# Patient Record
Sex: Male | Born: 1935 | Race: White
Health system: Southern US, Community
[De-identification: ages and names within clinical notes are randomized; demographics above are authoritative.]

## PROBLEM LIST (undated history)

## (undated) DIAGNOSIS — I739 Peripheral vascular disease, unspecified: Secondary | ICD-10-CM

## (undated) DIAGNOSIS — K219 Gastro-esophageal reflux disease without esophagitis: Secondary | ICD-10-CM

## (undated) DIAGNOSIS — I82409 Acute embolism and thrombosis of unspecified deep veins of unspecified lower extremity: Secondary | ICD-10-CM

## (undated) DIAGNOSIS — E785 Hyperlipidemia, unspecified: Secondary | ICD-10-CM

## (undated) HISTORY — PX: ABDOMINAL SURGERY: SHX537

## (undated) HISTORY — DX: Acute embolism and thrombosis of unspecified deep veins of unspecified lower extremity: I82.409

## (undated) HISTORY — DX: Peripheral vascular disease, unspecified: I73.9

---

## 2000-10-09 ENCOUNTER — Ambulatory Visit: Admission: RE | Admit: 2000-10-09 | Payer: Self-pay | Source: Ambulatory Visit | Admitting: Gastroenterology

## 2002-04-06 ENCOUNTER — Ambulatory Visit
Admit: 2002-04-06 | Disposition: A | Discharge status: Home / Self Care | Payer: Self-pay | Source: Ambulatory Visit | Admitting: Internal Medicine

## 2002-09-18 ENCOUNTER — Ambulatory Visit
Admit: 2002-09-18 | Disposition: A | Discharge status: Home / Self Care | Payer: Self-pay | Source: Ambulatory Visit | Admitting: Internal Medicine

## 2003-07-08 ENCOUNTER — Ambulatory Visit
Admit: 2003-07-08 | Disposition: A | Discharge status: Home / Self Care | Payer: Self-pay | Source: Ambulatory Visit | Admitting: Internal Medicine

## 2004-06-21 ENCOUNTER — Ambulatory Visit: Admission: RE | Admit: 2004-06-21 | Payer: Self-pay | Source: Ambulatory Visit | Admitting: Gastroenterology

## 2004-12-15 ENCOUNTER — Ambulatory Visit
Admit: 2004-12-15 | Disposition: A | Discharge status: Home / Self Care | Payer: Self-pay | Source: Ambulatory Visit | Admitting: Internal Medicine

## 2006-08-23 ENCOUNTER — Ambulatory Visit
Admit: 2006-08-23 | Disposition: A | Discharge status: Home / Self Care | Payer: Self-pay | Source: Ambulatory Visit | Admitting: Internal Medicine

## 2008-06-21 ENCOUNTER — Ambulatory Visit: Admission: RE | Admit: 2008-06-21 | Payer: Self-pay | Source: Ambulatory Visit | Admitting: Gastroenterology

## 2011-06-22 ENCOUNTER — Ambulatory Visit: Payer: Self-pay

## 2011-06-22 ENCOUNTER — Ambulatory Visit
Admission: RE | Admit: 2011-06-22 | Disposition: A | Discharge status: Home / Self Care | Payer: Self-pay | Source: Ambulatory Visit | Attending: Gastroenterology | Admitting: Gastroenterology

## 2011-06-25 LAB — LAB USE ONLY - HISTORICAL SURGICAL PATHOLOGY

## 2012-06-16 NOTE — Op Note (Unsigned)
DATE OF SURGERY:                    10/09/2000            SURGEON:                            Karen Kays, MD            ASSISTANT(S):                  PREOPERATIVE DIAGNOSIS:  Heme-positive stool.            POSTOPERATIVE DIAGNOSIS:  Large descending  colon  polyp.   Two  additional      polyps also found.  Moderate sigmoid diverticulosis.            PROCEDURE PERFORMED:  Colonoscopy.            ANESTHESIA:  Demerol 50 mg and Versed 3 mg IV.            SCOPE:  Olympus video adjustable pediatric colonoscope.            DESCRIPTION OF PROCEDURE:  Once informed  consent  had  been  obtained  the      patient  was  turned  in  the  left lateral  position  and  given  adequate      sedation.  The scope was easily advanced  into  the rectum and subsequently      to the level of the cecum.  The appendiceal orifice and ileocecal valve was      identified.  The terminal ileum was intubated  a  short distance.  No other      abnormalities were found.  The endoscope was slowly withdrawn.            Along the fold that contained the ileocecal valve, an adenomatous-appearing      polyp was found.  This was snared and  removed  with  excellent hemostasis.      This was removed with the endoscope.   The  scope  was then reintroduced to      the level of the cecum and slowly withdrawn.  The ascending colon otherwise      was normal.  In the transverse colon another polyp was identified; this was      also removed with snare cautery.  The remainder of the transverse colon was      normal.  The scope was withdrawn into the descending colon.            In the distal descending colon a 3-cm  polyp  was  found  on  a long stalk.      This was grabbed in one piece and adequately removed with no bleeding.  The      polyp was then removed with a basket.   The  remainder  of  the  visualized      sigmoid colon and rectum were normal.   On  retroflexed  view in the rectum      moderate-sized hemorrhoids were seen.   The scope was  removed.  The patient      tolerated the procedure well.            IMPRESSION:  Colonoscopy remarkable  for  three  large  polyps.  These were      successfully resected.  Moderate sigmoid diverticulosis.            PLAN      1.   Await biopsy results.      2.   Repeat colonoscopy in three years.      3.   Increase fibrinous diet.                                                        _____________________________________                                            _____                                            Karen Kays, MD      [id number]      D: 04/17/200212:01 P      T: 10/10/2000  6:47 A      J: [id number]      N: [id number]      CC: Lestine Mount, MD         Karen Kays, MD

## 2012-06-16 NOTE — Op Note (Unsigned)
DATE OF SURGERY:                    10/09/2000            SURGEON:                            Karen Kays, MD            ASSISTANT(S):                  PREOPERATIVE DIAGNOSIS:  Heme-positive stool.            POSTOPERATIVE DIAGNOSIS:  Large descending  colon  polyp.   Two  additional      polyps also found.  Moderate sigmoid diverticulosis.            PROCEDURE PERFORMED:  Colonoscopy.            ANESTHESIA:  Demerol 50 mg and Versed 3 mg IV.            SCOPE:  Olympus video adjustable pediatric colonoscope.            DESCRIPTION OF PROCEDURE:  Once informed  consent  had  been  obtained  the      patient  was  turned  in  the  left lateral  position  and  given  adequate      sedation.  The scope was easily advanced  into  the rectum and subsequently      to the level of the cecum.  The appendiceal orifice and ileocecal valve was      identified.  The terminal ileum was intubated  a  short distance.  No other      abnormalities were found.  The endoscope was slowly withdrawn.            Along the fold that contained the ileocecal valve, an adenomatous-appearing      polyp was found.  This was snared and  removed  with  excellent hemostasis.      This was removed with the endoscope.   The  scope  was then reintroduced to      the level of the cecum and slowly withdrawn.  The ascending colon otherwise      was normal.  In the transverse colon another polyp was identified; this was      also removed with snare cautery.  The remainder of the transverse colon was      normal.  The scope was withdrawn into the descending colon.            In the distal descending colon a 3-cm  polyp  was  found  on  a long stalk.      This was grabbed in one piece and adequately removed with no bleeding.  The      polyp was then removed with a basket.   The  remainder  of  the  visualized      sigmoid colon and rectum were normal.   On  retroflexed  view in the rectum      moderate-sized hemorrhoids were seen.   The scope was  removed.  The patient      tolerated the procedure well.            IMPRESSION:  Colonoscopy remarkable  for  three  large  polyps.  These were      successfully resected.  Moderate sigmoid diverticulosis.                  PLAN      1.   Await biopsy results.      2.   Repeat colonoscopy in three years.      3.   Increase fibrinous diet.                                                        _____________________________________                                            _____                                            Karen Kays, MD      [id number]      D: 04/17/200212:01 P      T: 10/10/2000  6:47 A      J: [id number]      N: [id number]      CC: Lestine Mount, MD         Karen Kays, MD

## 2012-06-27 NOTE — Op Note (Signed)
MRN: [id number] DOCUMENT ID: [id number]      INTRODUCTION:      79 YEAR OLD MALE PATIENT PRESENTS FOR AN ELECTIVE OUTPATIENT COLONOSCOPY.      THE INDICATION FOR THE PROCEDURE WAS FOR A HISTORY OF COLONIC POLYPS.      CONSENT:      THE BENEFITS, RISKS, AND ALTERNATIVES TO THE PROCEDURE WERE DISCUSSED AND      INFORMED CONSENT WAS OBTAINED.      PREPARATION:      EKG, PULSE, PULSE OXIMETRY AND BLOOD PRESSURE MONITORED.      MEDICATIONS:      - MAC ANESTHESIA WAS ADMINISTERED      PROCEDURE:      RECTAL EXAM: NORMAL.      THE ENDOSCOPE WAS PASSED WITHOUT DIFFICULTY TO THE TERMINAL ILEUM      CONFIRMED BY LANDMARKS.  RETROFLEXION WAS PERFORMED IN THE RECTUM.  THE      QUALITY OF THE PREPARATION WAS VERY GOOD.  THE STUDY WAS PERFORMED WITH A      CFQ 180 AL.      ASA CLASSIFICATION:      CLASS 1:  PATIENT HAS NO ORGANIC, PHYSIOLOGIC, BIOCHEMICAL OR PSYCHIATRIC      DISTURBANCE.      FINDINGS:  TWO SMALL POLYPS WERE SEEN IN THE SIGMOID.  TWO POLYPS WERE      REMOVED BY COLD SNARE.  SINGLE SMALL POLYP WAS SEEN IN THE RECTUM.  THE      POLYP WAS REMOVED BY SNARE CAUTERY.  SMALL INTERNAL HEMORRHOIDS WERE      PRESENT.  THERE WAS NO EVIDENCE OF DIVERTICULOSIS.  THE COLONOSCOPY WAS      OTHERWISE NORMAL.      COMPLICATIONS:      THERE WERE NO COMPLICATIONS ASSOCIATED WITH THE PROCEDURE.      IMPRESSION:      1.  TWO SMALL POLYPS IN THE SIGMOID. [211.3]. TWO POLYPS WERE REMOVED BY      COLD SNARE.      2.  SINGLE SMALL POLYP IN THE RECTUM. [211.4]. THE POLYP WAS REMOVED BY      SNARE CAUTERY.      3.  SMALL INTERNAL HEMORRHOIDS [455.0].      4.  THERE WAS NO EVIDENCE OF DIVERTICULOSIS.      5.  COLONOSCOPY, OTHERWISE NORMAL.      RECOMMENDATION:      - REPEAT COLONOSCOPY IN 3 YEARS.      - AWAIT BIOPSY RESULTS.      SIGNING PHYSICIAN: Ramar Nobrega L

## 2017-04-02 ENCOUNTER — Emergency Department: Payer: Medicare Other

## 2017-04-02 ENCOUNTER — Emergency Department
Admission: EM | Admit: 2017-04-02 | Discharge: 2017-04-02 | Disposition: A | Discharge status: Home / Self Care | Payer: Medicare Other | Attending: Emergency Medicine | Admitting: Emergency Medicine

## 2017-04-02 ENCOUNTER — Observation Stay
Admission: EM | Admit: 2017-04-02 | Discharge: 2017-04-03 | Disposition: A | Discharge status: Home / Self Care | Payer: Medicare Other | Attending: Surgery | Admitting: Surgery

## 2017-04-02 DIAGNOSIS — S0240DA Maxillary fracture, left side, initial encounter for closed fracture: Secondary | ICD-10-CM | POA: Insufficient documentation

## 2017-04-02 DIAGNOSIS — K219 Gastro-esophageal reflux disease without esophagitis: Secondary | ICD-10-CM | POA: Insufficient documentation

## 2017-04-02 DIAGNOSIS — S066X0A Traumatic subarachnoid hemorrhage without loss of consciousness, initial encounter: Secondary | ICD-10-CM | POA: Diagnosis present

## 2017-04-02 DIAGNOSIS — I609 Nontraumatic subarachnoid hemorrhage, unspecified: Secondary | ICD-10-CM

## 2017-04-02 DIAGNOSIS — W109XXA Fall (on) (from) unspecified stairs and steps, initial encounter: Secondary | ICD-10-CM

## 2017-04-02 DIAGNOSIS — Y92000 Kitchen of unspecified non-institutional (private) residence as  the place of occurrence of the external cause: Secondary | ICD-10-CM

## 2017-04-02 DIAGNOSIS — E785 Hyperlipidemia, unspecified: Secondary | ICD-10-CM | POA: Insufficient documentation

## 2017-04-02 DIAGNOSIS — J341 Cyst and mucocele of nose and nasal sinus: Secondary | ICD-10-CM | POA: Insufficient documentation

## 2017-04-02 DIAGNOSIS — W010XXA Fall on same level from slipping, tripping and stumbling without subsequent striking against object, initial encounter: Secondary | ICD-10-CM

## 2017-04-02 DIAGNOSIS — S0232XA Fracture of orbital floor, left side, initial encounter for closed fracture: Principal | ICD-10-CM | POA: Insufficient documentation

## 2017-04-02 DIAGNOSIS — S0285XA Fracture of orbit, unspecified, initial encounter for closed fracture: Secondary | ICD-10-CM | POA: Diagnosis present

## 2017-04-02 DIAGNOSIS — W19XXXA Unspecified fall, initial encounter: Secondary | ICD-10-CM

## 2017-04-02 DIAGNOSIS — W01198A Fall on same level from slipping, tripping and stumbling with subsequent striking against other object, initial encounter: Secondary | ICD-10-CM | POA: Insufficient documentation

## 2017-04-02 DIAGNOSIS — S0181XA Laceration without foreign body of other part of head, initial encounter: Secondary | ICD-10-CM

## 2017-04-02 DIAGNOSIS — S0083XA Contusion of other part of head, initial encounter: Secondary | ICD-10-CM | POA: Diagnosis present

## 2017-04-02 DIAGNOSIS — I452 Bifascicular block: Secondary | ICD-10-CM | POA: Insufficient documentation

## 2017-04-02 DIAGNOSIS — S01412A Laceration without foreign body of left cheek and temporomandibular area, initial encounter: Secondary | ICD-10-CM | POA: Insufficient documentation

## 2017-04-02 DIAGNOSIS — G8911 Acute pain due to trauma: Secondary | ICD-10-CM | POA: Diagnosis present

## 2017-04-02 DIAGNOSIS — S02401A Maxillary fracture, unspecified, initial encounter for closed fracture: Secondary | ICD-10-CM

## 2017-04-02 DIAGNOSIS — S0990XA Unspecified injury of head, initial encounter: Secondary | ICD-10-CM

## 2017-04-02 DIAGNOSIS — R2689 Other abnormalities of gait and mobility: Secondary | ICD-10-CM | POA: Insufficient documentation

## 2017-04-02 DIAGNOSIS — R930 Abnormal findings on diagnostic imaging of skull and head, not elsewhere classified: Secondary | ICD-10-CM | POA: Insufficient documentation

## 2017-04-02 HISTORY — DX: Hyperlipidemia, unspecified: E78.5

## 2017-04-02 HISTORY — DX: Gastro-esophageal reflux disease without esophagitis: K21.9

## 2017-04-02 LAB — CBC AND DIFFERENTIAL
Absolute NRBC: 0 10*3/uL
Basophils Absolute Automated: 0.02 10*3/uL (ref 0.00–0.20)
Basophils Automated: 0.3 %
Eosinophils Absolute Automated: 0.07 10*3/uL (ref 0.00–0.70)
Eosinophils Automated: 0.9 %
Hematocrit: 37 % — ABNORMAL LOW (ref 42.0–52.0)
Hgb: 12.8 g/dL — ABNORMAL LOW (ref 13.0–17.0)
Immature Granulocytes Absolute: 0.04 10*3/uL
Immature Granulocytes: 0.5 %
Lymphocytes Absolute Automated: 0.86 10*3/uL (ref 0.50–4.40)
Lymphocytes Automated: 11.2 %
MCH: 31.4 pg (ref 28.0–32.0)
MCHC: 34.6 g/dL (ref 32.0–36.0)
MCV: 90.9 fL (ref 80.0–100.0)
MPV: 8.9 fL — ABNORMAL LOW (ref 9.4–12.3)
Monocytes Absolute Automated: 0.35 10*3/uL (ref 0.00–1.20)
Monocytes: 4.6 %
Neutrophils Absolute: 6.33 10*3/uL (ref 1.80–8.10)
Neutrophils: 82.5 %
Nucleated RBC: 0 /100 WBC (ref 0.0–1.0)
Platelets: 136 10*3/uL — ABNORMAL LOW (ref 140–400)
RBC: 4.07 10*6/uL — ABNORMAL LOW (ref 4.70–6.00)
RDW: 14 % (ref 12–15)
WBC: 7.67 10*3/uL (ref 3.50–10.80)

## 2017-04-02 LAB — BASIC METABOLIC PANEL
Anion Gap: 9 (ref 5.0–15.0)
BUN: 14 mg/dL (ref 9–28)
CO2: 23 mEq/L (ref 22–29)
Calcium: 8.9 mg/dL (ref 7.9–10.2)
Chloride: 108 mEq/L (ref 100–111)
Creatinine: 1.2 mg/dL (ref 0.7–1.3)
Glucose: 96 mg/dL (ref 70–100)
Potassium: 3.5 mEq/L (ref 3.5–5.1)
Sodium: 140 mEq/L (ref 136–145)

## 2017-04-02 LAB — GFR: EGFR: 57.4

## 2017-04-02 LAB — PT AND APTT
PT INR: 1.1 (ref 0.9–1.1)
PT: 13.8 s (ref 12.6–15.0)
PTT: 29 s (ref 23–37)

## 2017-04-02 MED ORDER — ACETAMINOPHEN 500 MG PO TABS
1000.0000 mg | ORAL_TABLET | Freq: Once | ORAL | Status: AC
Start: 2017-04-02 — End: 2017-04-02
  Administered 2017-04-02: 1000 mg via ORAL
  Filled 2017-04-02: qty 2

## 2017-04-02 MED ORDER — ACETAMINOPHEN 325 MG PO TABS
650.0000 mg | ORAL_TABLET | Freq: Once | ORAL | Status: AC
Start: 2017-04-02 — End: 2017-04-02
  Administered 2017-04-02: 650 mg via ORAL
  Filled 2017-04-02: qty 2

## 2017-04-02 NOTE — ED Provider Notes (Signed)
Physician/Midlevel provider first contact with patient: 04/02/17 1927          [facility] EMERGENCY DEPARTMENT H&P            CLINICAL SUMMARY          Diagnosis:    .     Final diagnoses:   Subarachnoid hemorrhage   Closed fracture of left side of maxilla, initial encounter   Closed fracture of orbit, initial encounter         MDM Notes:    MDM, Differential Diagnosis (not completely inclusive) and Plan: Transferred from outlying facility for mechnical fall with left facial fxs and small SAH.   No anticoagulation, normal neuro exam.  WIll review imaging and labs, discuss with NSG and Trauma.          Disposition:      ED Disposition     ED Disposition Condition Date/Time Comment    Observation  Tue Apr 02, 2017 11:34 PM Admitting Physician: Lacretia Nicks [facility] [[id number]]   Diagnosis: Subarachnoid hemorrhage [430.ICD-9-CM]   Estimated Length of Stay: < 2 midnights   Tentative Discharge Plan?: Home or Self Care [1]   Patient Class: Observation [104]            New Prescriptions    No medications on file                    CLINICAL INFORMATION        HPI:      Chief Complaint: Fall and Facial Injury  .    Dillon Wood is a 83 y.o. male with a h/o GERD and HLD who presents BIBA in transfer from [facility] with left orbital floor fracture and concern for right SAH on head CT s/p mechanical fall 5-6 hours pta. Pt and his wife are OOT from [place], visiting their daughter's family. He states today he was going to get a leash to walk the family dog when he tripped on a ledge and fell, hitting the left side of his face against the bar counter of their kitchen. No LOC and pt sts he was able to get up after the fall. He is not anticoagulated. He was initially evaluated at [facility], at which time he had no focal neuro deficits. CT of the face showed orbital floor fracture with no signs of entrapment, maxillary sinus fracture. CT of the head showed possible small SAH and he was transferred here for further evaluation and care.      In ED pt states he feels fine, has a slight left frontal HA however no facial pain. He states he did receive 2 tylenol prior to transfer. No visual changes.    History obtained from: patient, family, review of prior chart      Symptoms have worsened in past 24-48 hours: N/A    Location: Left face  Quality: N/A  Severity: N/A  Duration: approximately 6 hours  Timing: N/A  Context: mechanical fall  Modifying Factors: N/A  Associated signs or symptoms: Mild headache      PMD   Pcp, Notonfile, MD      ROS:      -Documented in HPI. Otherwise all other systems reviewed and negative.   -Nursing notes reviewed by me.         Physical Exam:      PE   BP: 167/80, Temp: 97.9 F (36.6 C), Temp Source: Oral, Heart Rate: 68, Resp Rate: 16, SpO2: 100 %  Pulse Oximetry Analysis -100 % on room air; Normal  Vitals reviewed   GEN: . Nontoxic, Well appearing, NAD.          Head: NC  Eccymosis under the left eye and significant edema of the left cheek with dependent ecchymosis under the left cheek. Non-bleeding superficial linear lac to the right cheek. Dentition and bite are intact.     Eyes: EOMI w/o pain, nl conjunctiva. NO d/c.    PERRLA     ENT: MMM, symmetric OP, no drooling/trismus.          Neck: FROM, supple, no masses, no tenderness.         Chest: ctab, NO resp distress. Equal chest rise.          CV: rrr, NO significant murmur appreciated.          Abd: no peritoneal signs, soft, benign, no distention, no tenderness          GU:         Back: NO CVAt, NO midline ttp.         UpperExt: NO deformity. FROM, neurovasc intact. 2+radial         LowerExt: NO edema. FROM, neurovasc intact. 2+DP         Neuro: moving all ext equally, no motor deficits, normal gait.          Skin: Warm and dry. NO rash. Cap refill <5sec       Psych: Normal affect. Normal insight.                      PAST HISTORY        Primary Care Provider: Patsy Lager, MD        PMH/PSH:    .     Past Medical History:   Diagnosis Date   . Gastroesophageal  reflux disease    . Hyperlipemia        He has no past surgical history on file.      Social/Family History:      He reports that he has never smoked. He has never used smokeless tobacco. He reports that he drinks alcohol. He reports that he does not use drugs.    No family history on file.      Listed Medications on Arrival:    .     Previous Medications    No medications on file      Allergies: He has No Known Allergies.            VISIT INFORMATION        Clinical Course in the ED:       8:11 PM  Discussed with neurosurgery, they will see pt.    9:01 PM  Neurosurgery has evaluated patient, recommend admission to the floor for q4 hr neuro checks.    9:01 PM  Trauma aware of pt, will consult.        Medications Given in the ED:    .     ED Medication Orders     Start Ordered     Status Ordering Provider    04/02/17 2226 04/02/17 2225  acetaminophen (TYLENOL) tablet 1,000 mg  Once     Route: Oral  Ordered Dose: 1,000 mg     Last MAR action:  Given Mozetta Murfin L            Procedures:      Procedures      Interpretations:  EKG as interpreted by me, Dr. Salley Scarlet: none     Cardiac monitor interpretation by me, Dr. Adaly Puder:Sinus in 31s  Radiologic study results reviewed by EDP: YES  Radiologic Studies Interpreted (viewed) by me, Dr. Salley Scarlet: n/a  Laboratory results reviewed by EDP: YES                  RESULTS        Lab Results:      Results     ** No results found for the last 24 hours. **              Radiology Results:      No orders to display               Scribe Attestation:      I was acting as a Neurosurgeon for Judi Saa, MD on [name]  Treatment Team: Scribe: Maryla Morrow    I am the first provider for this patient and I personally performed the services documented. Treatment Team: Scribe: Maryla Morrow is scribing for me on Wood,Dillon L. This note accurately reflects work and decisions made by me.  Judi Saa, MD           Noralyn Pick, MD       Judi Saa, MD  04/02/17 [phone]

## 2017-04-02 NOTE — Consults (Signed)
NEUROSURGERY CONSULTATION    Date Time: 04/02/17 8:23 PM  Patient Name: Dillon Wood  Requesting Physician: Judi Saa, MD  Consulting Physician: Dr. Joie Bimler   Covered By: Neurosurgery PA pager [phone]    Time of Exam: 2030     Reason for Consultation:   Small right parietal traumatic subarachnoid hemorrhage, s/p fall while walking    History:   Dillon Wood is a 83 y.o. right handed male who presents to the hospital on 04/02/2017 with a small right parietal traumatic subarachnoid hemorrhage sustained when he fell at home earlier today. Patient tripped and struck his face on the edge of the counter top, with no loss of consciousness, and ambulatory on the scene after the fall. Patient was initially evaluated at [facility], where HCT was performed, showing the injury as noted, prompting transfer to [facility] and Neurosurgical consult. At time of exam, patient reports mild headache, denies dizziness, weakness, numbness/tingling, neck/back pain, nausea/vomiting, vision/hearing changes, chest pain, abdominal pain, shortness of breath. No anticoagulating medication.    Past Medical History:     Past Medical History:   Diagnosis Date   . Gastroesophageal reflux disease    . Hyperlipemia        Past Surgical History:   History reviewed. No pertinent surgical history.    Family History:   No family history on file.    Social History:     Social History     Social History   . Marital status: Married     Spouse name: N/A   . Number of children: N/A   . Years of education: N/A     Social History Main Topics   . Smoking status: Never Smoker   . Smokeless tobacco: Never Used   . Alcohol use Yes   . Drug use: No   . Sexual activity: Not on file     Other Topics Concern   . Not on file     Social History Narrative   . No narrative on file       Allergies:   No Known Allergies    Medications:       Review of Systems:   A comprehensive review of systems was: Negative except noted in HPI. All other systems were reviewed and are  negative    Physical Exam:     Vitals:    04/02/17 1930   BP: 162/90   Pulse: 69   Resp: 16   Temp: 97.9 F (36.6 C)   SpO2: 100%       Intake and Output Summary (Last 24 hours) at Date Time  No intake or output data in the 24 hours ending 04/02/17 2023  General Appearance: elderly male, no obvious acute distress    Physical Exam:  Heart: RRR  Lungs: CTA  Abdomen: SNT    Awake alert oriented x 3   GCS: 15 E: 4 V: 5 M: 6  Speech is clear and fluent   PERRL 4 mm EOM: intact, face: left facial edema from fx's, Tongue: midline   Cranial nerves:   -CN II: Visual fields full to bedside confrontation   -CN III, IV, VI: Pupils equal, round, and reactive to light; extraocular movements intact; no ptosis                                 -CN V: Facial sensation intact in V1 through V3 distributions   -CN  VII: left facial edema, UTA  -CN VIII: Hearing intact to conversational speech   -CN IX, X: Palate elevates symmetrically; normal phonation   -CN XI: Symmetric full strength of sternocleidomastoid and trapezius muscles   -CN XII: Tongue protrudes midline  Strength/Motor function:   LUE  Delt: 5/5 Bi: 5/5 Tri: 5/5 Wfl: 5/5 Wex: 5/5 Grip: 5/5  RUE  Delt: 5/5 Bi: 5/5 Tri: 5/5 Wfl: 5/5 Wex: 5/5 Grip: 5/5  LLE  Psoas: 5/5 Quad: 5/5 Lfl: 5/5 Lex: 5/5 ADF: 5/5 APF: 5/5 EHL: 5/5  RLE  Psoas: 5/5 Quad: 5/5 Lfl: 5/5 Lex: 5/5 ADF: 5/5 APF: 5/5 EHL: 5/5  Sensation to light touch: negative  Sensation to sharp touch: negative  Pronator drift: absent  DTRs:   Left: Bic: 1+ Pat: 1+ Ankle: 1+  Right: Bic: 1+ Pat: 1+ Ankle: 1+  Hoffmann's: negative  Clonus: negative  Babinski: mute bilaterally     Labs Reviewed:     Lab Results   Component Value Date    WBC 7.67 04/02/2017    HGB 12.8 (L) 04/02/2017    HCT 37.0 (L) 04/02/2017    MCV 90.9 04/02/2017    PLT 136 (L) 04/02/2017     Lab Results   Component Value Date    NA 140 04/02/2017    K 3.5 04/02/2017    CL 108 04/02/2017    CO2 23 04/02/2017     Lab Results   Component Value Date    INR 1.1  04/02/2017    PT 13.8 04/02/2017       Rads:     CT Head without Contrast [[id number]] (Order [id number])   Status: Final result   Study Result     HISTORY: fall headache,     TECHNIQUE: Routine  head CT without contrast.  Dose reduction with automatic exposure control, iterative  reconstruction, and/or adjustment of the mA and/or kV according to  patient size.  COMPARISON: None.  FINDINGS:   There is minimal high density about the right frontoparietal junction  sulci finding that may represent subtle subarachnoid hemorrhage. No  evidence of mass producing hematoma.   There is no evidence of acute ischemia.  There is no mass, mass effect, hemorrhage, or extra-axial fluid  collection.   The ventricles and the basal cisterns are unremarkable.  Left facial subcutaneous swelling noted. There is normal aeration of the  mastoid air cells. The skull is normal without fracture.     These  findings discussed and acknowledged with emergency physician in charge  of patient, at  04/02/2017 4:59 PM.     IMPRESSION:      1.  Possible very subtle subarachnoid hemorrhage about the right  parietal frontoparietal region. No evidence of mass producing hematoma.                  Einar Pheasant, MD   04/02/2017 5:01 PM     CT Maxillofacial Bones [[id number]] (Order [id number])   Status: Final result   Study Result     HISTORY: Posttraumatic pain    COMPARISON: None    TECHNIQUE: Noncontrast CT imaging of the face was performed in the axial  plane. Sagittal and coronal reformatted images were provided.    The following dose reduction techniques were utilized: automated  exposure control and/or adjustment of the mA and/or kV according to  patient size, and the use of iterative reconstruction technique.      FINDINGS: There is a soft tissue hematoma about the  left face centered  in the left maxillary region. There is a fracture of the left orbital  floor and left maxillary sinus without significant fluid within the  paranasal sinuses or nasal  fossa. There is a retention cyst along the  floor the left maxillary sinus. No intraocular abnormality or lens  displacement is seen. The orbital fat planes appear maintained. The  extraocular muscles and optic nerves are symmetric. The balance of the  facial skeleton appears intact. The mastoid and middle ear cavity  regions are clear. There is rightward nasal septal deviation.    IMPRESSION:     1. Left facial soft tissue hematoma.  2. Left maxillary sinus and left orbital floor fractures.    Otho Ket, MD   04/02/2017 5:14 PM       Assessment:   83 year old male with tiny wisp of traumatic subarachnoid hemorrhage, high right convexity, s/p fall    Plan:   Admit to Trauma Service  Medical management per Trauma Team  q 4 hour neuro checks  No anticoagulation/ASA/NSAIDs  No AED unless patient has a seizure  Repeat CT for neuro decline  HOB> 30 degrees  No acute neurosurgical intervention at this time    Plan discussed with and developed by Dr. Adrienne Mocha    ICP monitor: Patient does not meet criteria for placement, as: patient is following commands, patient has a neurologic exam that can be followed and this is not a severe TBI    Signed by: Cecilio Asper PA-C  Date/Time: 04/02/17 8:23 PM

## 2017-04-02 NOTE — ED Provider Notes (Signed)
Physician/Midlevel provider first contact with patient: 04/02/17 1612         History     Chief Complaint   Patient presents with   . Fall   . Facial Injury     Dillon Wood is a 83 y.o. Male with history of reflux, here for evaluation of mechanical fall.  Patient was visiting his children.  Took a misstep while at home and fell forward hitting the left side of his face onto the corner of a counter.  Patient denies any LOC, altered mentation, nausea, vomiting, visual changes.  Does state that he has a mild headache as well.  Denies any other neck pain, shortness of breath, chest pain, hip pain, or other concerns.  Patient is ambulatory.  Appears in no acute distress.  Here for evaluation.  Patient is on no blood thinning medications               Nursing (triage) note reviewed for the following pertinent information:  Pt with trip and fall at approx 3pm onto face. Pt with L sided facial swelling. Not on blood thinners. Denies LOC.    Past Medical History:   Diagnosis Date   . Gastroesophageal reflux disease    . Hyperlipemia        No past surgical history on file.    No family history on file.    Social  Social History   Substance Use Topics   . Smoking status: Never Smoker   . Smokeless tobacco: Never Used   . Alcohol use Yes       .     No Known Allergies    Home Medications     Med List Status:  In Progress Set By: Dillon Circle, RN at 04/02/2017  3:26 PM        No Medications           Review of Systems   Constitutional: Negative for fever.   HENT: Negative for congestion.    Respiratory: Negative for cough and shortness of breath.    Cardiovascular: Negative for chest pain.   Gastrointestinal: Negative for abdominal pain, diarrhea and vomiting.   Musculoskeletal: Negative for arthralgias and back pain.   Skin: Negative for wound.   Neurological: Positive for headaches. Negative for dizziness, seizures, syncope, facial asymmetry, weakness and numbness.       Physical Exam    BP: (!) 164/100, Heart Rate: 76,  Temp: 97.8 F (36.6 C), Resp Rate: 18, SpO2: 96 %, Weight: 77.1 kg    Physical Exam   Constitutional: He is oriented to person, place, and time. He appears well-developed and well-nourished. No distress.   HENT:   Head: Normocephalic.       Right Ear: External ear normal.   Left Ear: External ear normal.   Mouth/Throat: Oropharynx is clear and moist. No oropharyngeal exudate.   Eyes: Pupils are equal, round, and reactive to light. EOM are normal.   Neck: Normal range of motion. Neck supple.   Cardiovascular: Normal rate and regular rhythm.    Pulmonary/Chest: Effort normal and breath sounds normal. No respiratory distress. He has no wheezes. He has no rales.   Neurological: He is alert and oriented to person, place, and time. No cranial nerve deficit.   Skin: Skin is warm. He is not diaphoretic.   Psychiatric: He has a normal mood and affect. His behavior is normal. Thought content normal.   Nursing note and vitals reviewed.  MDM and ED Course     ED Medication Orders     Start Ordered     Status Ordering Provider    04/02/17 1714 04/02/17 1713  acetaminophen (TYLENOL) tablet 650 mg  Once     Route: Oral  Ordered Dose: 650 mg     Last MAR action:  Given Deysy Schabel             MDM  Number of Diagnoses or Management Options  Closed fracture of left orbital floor, initial encounter:   Closed fracture of left side of maxilla, initial encounter:   Fall, initial encounter:   Injury of head, initial encounter:   SAH (subarachnoid hemorrhage):   Diagnosis management comments: Pt here for eval of mechanical fall while at his daughter's home.   Patient took a misstep and hit the left side of his face on the corner of a countertop.  Denies any LOC, altered mentation.  Patient does state that he has a headache as well.  Bedside exam shows no focal neuro deficits  Appears in no acute distress  CT face shows orbital floor fracture, no signs of entrapment, maxillary sinus fracture.  CT head shows possible small  subarachnoid hemorrhage.  Discussed this with radiologist  Discussed with attending physician  Discussed with [facility] ED.  Piedad Climes, attending Dr. Curator who looks at the patient  Discussed with patient and wife who understands                     Procedures    Clinical Impression & Disposition     Clinical Impression  Final diagnoses:   Fall, initial encounter   Closed fracture of left side of maxilla, initial encounter   Injury of head, initial encounter   SAH (subarachnoid hemorrhage)   Closed fracture of left orbital floor, initial encounter        ED Disposition     ED Disposition Condition Date/Time Comment    Transfer to Another Facility  Tue Apr 02, 2017  6:00 PM [name] should be transferred out to [facility] ED. Dr Arlyss Repress is accepting ER MD.              There are no discharge medications for this patient.                Nettie Elm, [place]  04/02/17 1921

## 2017-04-02 NOTE — H&P (Addendum)
TRAUMA HISTORY AND PHYSICAL     Date Time: 04/02/17 10:26 PM  Patient Name: Dillon Wood  Attending Physician: Lacretia Nicks, MD  Primary Care Physician: Patsy Lager, MD    Date of Admission:   04/02/2017  7:26 PM    Trauma Level:   Consult    Assessment/Plan:   The patient has the following active problems:  Patient Active Problem List   Diagnosis   . Traumatic subarachnoid hemorrhage without loss of consciousness, initial encounter   . Closed fracture of left side of maxilla   . Left orbit fracture, closed, initial encounter   . Facial contusion, initial encounter   . Acute pain due to trauma       Plan by systems:  Neuro: subtle right frontoparietal SAH.   -neurosurgery consult  -no current indication for AEDs  -q4h neuro check  -pain control prn  Pulm:  -pulmonary toilet  CV: history of HLD  Continue statin  Endo:  No active issues  GI:   NPO, IVF while monitoring neuro exam  Neuromuscular: left orbital floor fracture, maxillary fracture, extensive facial edema  -plastic surgery consult consult  Psych: no active issues, good family support. Wife and daughter present at bedside  Wounds: small laceration left maxillary region  No repair indicated    Trauma Acute Care Surgery Attending    I have seen and examined this patient with the Trauma Acute Care Surgery team.  I attest to the resident note after review.  We reviewed all pertinent labs, xrays and consultant reports and determined a plan of care. I have edited the resident note and am including additional plans below:    Admit to ward  Neuro  - serial exams  - pain management  Pulm  - pulmonary hygiene  CV  - monitor hemodynamics  - home meds: statin  FEN  - IVF hydration  - diet: NPO, start diet in AM with no changes  - monitor I/Os  - foley: no  Misc  - DVT prophylaxis: chemical:contraindicated, mechanical: SCDs  - consultation: Neurosurgery, Plastic surgery  Discussed plan of care with pt and his wife.  They are visiting their daughter from [place]  and we discussed ok for follow-up in [place].    Darci Current, MD, FACS   Acute Care Surgery  General Surgery/Trauma/Critical Care  [id number]    Patient will be admitted to: WARD  Massive transfusion protocol:  No      Consulting Services:   Plastics - Thedore Mins    Patient Complaint:   Dillon Wood is a 83 y.o. male who presents to the hospital after Fall: Fall from # of stairs: 1.  Patient was in his daughter's home (visiting from [place]) when he missed a stair and fell, hitting his head on a cabinet. He denies loss of consciousness, confirmed by the two witnesses who were there (wife and daughter). He initially went to [facility], where he was found to have possible right frontoparietal SAH, left maxillary sinus fracture, and orbital floor fracture. Negative CT C-spine. He reports left frontal headache and no other symptoms. Walked under his own power after the fall. No history of anticoagulation     Denies blurry vision or vision changes, difficulty speaking or breathing, chest pain, shortness of breath, abdominal pain, nausea, vomiting.       Allergies:   No Known Allergies    Medication:     Not sure, reports taking something for HLD    Past Medical History:  Past Medical History:   Diagnosis Date   . Gastroesophageal reflux disease    . Hyperlipemia    SCC and BCC of head, excised    Past Surgical History:   History reviewed. No pertinent surgical history.  Inguinal hernia repair    Family History:   none    Social History:     Social History     Social History   . Marital status: Married     Spouse name: N/A   . Number of children: N/A   . Years of education: N/A     Social History Main Topics   . Smoking status: Never Smoker   . Smokeless tobacco: Never Used   . Alcohol use Yes   . Drug use: No   . Sexual activity: Not on file     Other Topics Concern   . Not on file     Social History Narrative   . No narrative on file         Review of Systems:   Review of Systems   Eyes: Negative for blurred vision.    Neurological: Negative for loss of consciousness.   All other systems reviewed and are negative.      Physical Exam:   Physical Exam   Constitutional: He is oriented to person, place, and time and well-developed, well-nourished, and in no distress. He appears not jaundiced. He appears not cachectic.  Non-toxic appearance. No distress.   HENT:   Head: Normocephalic. Head is with contusion.       Right Ear: External ear normal.   Left Ear: External ear normal.   Nose: Nose normal.   Eyes: Pupils are equal, round, and reactive to light. Conjunctivae and EOM are normal. Right eye exhibits no discharge and no exudate. Left eye exhibits no discharge and no exudate.   Neck: Trachea normal and normal range of motion. Neck supple. No tracheal tenderness, no spinous process tenderness and no muscular tenderness present. No neck rigidity. No tracheal deviation, no edema, no erythema and normal range of motion present.   Cardiovascular: Normal rate, regular rhythm, normal heart sounds and intact distal pulses.    Pulses:       Radial pulses are 2+ on the right side, and 2+ on the left side.   Pulmonary/Chest: Effort normal and breath sounds normal. No stridor.   Abdominal: Soft. Normal appearance. There is no tenderness. There is no rebound and no CVA tenderness.   obese   Musculoskeletal:        Right shoulder: Normal.        Left shoulder: Normal. He exhibits normal range of motion, no tenderness, no bony tenderness, no swelling, no effusion and no deformity.        Right elbow: He exhibits normal range of motion.        Left elbow: Normal. He exhibits normal range of motion.        Right hip: He exhibits normal range of motion.        Left hip: He exhibits normal range of motion.        Right knee: He exhibits normal range of motion and no swelling. No tenderness found.        Left knee: He exhibits normal range of motion and no swelling. No tenderness found.   Neurological: He is alert and oriented to person, place, and  time. He has normal strength. He is not agitated and not disoriented. He displays normal speech. No cranial  nerve deficit or sensory deficit. GCS score is 15.   Skin: He is not diaphoretic.   Vitals reviewed.      Vitals:    04/02/17 2100   BP: 139/73   Pulse: 65   Resp:    Temp:    SpO2: 98%           Labs:     Results     ** No results found for the last 24 hours. **          Rads:   Radiological Procedure reviewed.      None      The following images were received from an outside facility and reviewed:CT Head and CT Spine

## 2017-04-02 NOTE — ED Notes (Signed)
Patient remains awake and alert, talkative with the transport team.  Swelling slightly improved to the left cheek area, however being to show some bruising.  Spouse remains in attendance.

## 2017-04-02 NOTE — Progress Notes (Signed)
Neurosurgery Progress Note    NS Examined Date Time: 04/02/17 8:23 PM  Signed by: Cecilio Asper  Time consult called: 2015  Date/Time: 04/02/17 8:23 PM

## 2017-04-02 NOTE — ED Triage Notes (Signed)
Pt with trip and fall at approx 3pm onto face. Pt with L sided facial swelling. Not on blood thinners. Denies LOC. Pt alert, gait steady in triage.

## 2017-04-03 DIAGNOSIS — S0282XA Fracture of other specified skull and facial bones, left side, initial encounter for closed fracture: Secondary | ICD-10-CM

## 2017-04-03 DIAGNOSIS — S0083XA Contusion of other part of head, initial encounter: Secondary | ICD-10-CM | POA: Diagnosis present

## 2017-04-03 DIAGNOSIS — G8911 Acute pain due to trauma: Secondary | ICD-10-CM | POA: Diagnosis present

## 2017-04-03 DIAGNOSIS — S0240DA Maxillary fracture, left side, initial encounter for closed fracture: Secondary | ICD-10-CM

## 2017-04-03 DIAGNOSIS — S0285XA Fracture of orbit, unspecified, initial encounter for closed fracture: Secondary | ICD-10-CM | POA: Diagnosis present

## 2017-04-03 DIAGNOSIS — S066X0A Traumatic subarachnoid hemorrhage without loss of consciousness, initial encounter: Secondary | ICD-10-CM

## 2017-04-03 DIAGNOSIS — I609 Nontraumatic subarachnoid hemorrhage, unspecified: Secondary | ICD-10-CM

## 2017-04-03 HISTORY — DX: Contusion of other part of head, initial encounter: S00.83XA

## 2017-04-03 HISTORY — DX: Fracture of orbit, unspecified, initial encounter for closed fracture: S02.85XA

## 2017-04-03 HISTORY — DX: Maxillary fracture, left side, initial encounter for closed fracture: S02.40DA

## 2017-04-03 MED ORDER — CLINDAMYCIN HCL 300 MG PO CAPS
300.0000 mg | ORAL_CAPSULE | Freq: Three times a day (TID) | ORAL | 0 refills | Status: AC
Start: 2017-04-03 — End: 2017-04-08

## 2017-04-03 MED ORDER — TRAMADOL HCL 50 MG PO TABS
25.0000 mg | ORAL_TABLET | Freq: Four times a day (QID) | ORAL | 0 refills | Status: DC | PRN
Start: 2017-04-03 — End: 2021-02-10

## 2017-04-03 MED ORDER — LACTATED RINGERS IV SOLN
100.0000 mL/h | INTRAVENOUS | Status: DC
Start: 2017-04-03 — End: 2017-04-03
  Administered 2017-04-03: 100 mL/h via INTRAVENOUS

## 2017-04-03 MED ORDER — TRAMADOL HCL 50 MG PO TABS
50.0000 mg | ORAL_TABLET | Freq: Four times a day (QID) | ORAL | Status: DC | PRN
Start: 2017-04-03 — End: 2017-04-03

## 2017-04-03 MED ORDER — ONDANSETRON HCL 4 MG PO TABS
4.0000 mg | ORAL_TABLET | Freq: Three times a day (TID) | ORAL | Status: DC | PRN
Start: 2017-04-03 — End: 2017-04-03

## 2017-04-03 MED ORDER — SENNOSIDES-DOCUSATE SODIUM 8.6-50 MG PO TABS
1.0000 | ORAL_TABLET | Freq: Every evening | ORAL | Status: DC
Start: 2017-04-03 — End: 2017-04-03

## 2017-04-03 MED ORDER — ACETAMINOPHEN 500 MG PO TABS
500.0000 mg | ORAL_TABLET | Freq: Four times a day (QID) | ORAL | Status: DC
Start: 2017-04-03 — End: 2017-04-03
  Administered 2017-04-03 (×2): 500 mg via ORAL
  Filled 2017-04-03 (×2): qty 1

## 2017-04-03 MED ORDER — ONDANSETRON HCL 4 MG/2ML IJ SOLN
4.0000 mg | Freq: Three times a day (TID) | INTRAMUSCULAR | Status: DC | PRN
Start: 2017-04-03 — End: 2017-04-03

## 2017-04-03 MED ORDER — ACETAMINOPHEN 500 MG PO TABS
500.0000 mg | ORAL_TABLET | Freq: Four times a day (QID) | ORAL | Status: DC
Start: 2017-04-03 — End: 2021-02-10

## 2017-04-03 MED ORDER — CLINDAMYCIN HCL 150 MG PO CAPS
300.0000 mg | ORAL_CAPSULE | Freq: Three times a day (TID) | ORAL | Status: DC
Start: 2017-04-03 — End: 2017-04-03

## 2017-04-03 MED ORDER — NALOXONE HCL 0.4 MG/ML IJ SOLN (WRAP)
0.2000 mg | INTRAMUSCULAR | Status: DC | PRN
Start: 2017-04-03 — End: 2017-04-03

## 2017-04-03 MED ORDER — CLINDAMYCIN HCL 150 MG PO CAPS
300.0000 mg | ORAL_CAPSULE | Freq: Three times a day (TID) | ORAL | Status: DC
Start: 2017-04-03 — End: 2017-04-03
  Filled 2017-04-03 (×3): qty 2

## 2017-04-03 MED ORDER — TRAMADOL HCL 50 MG PO TABS
25.0000 mg | ORAL_TABLET | Freq: Four times a day (QID) | ORAL | Status: DC | PRN
Start: 2017-04-03 — End: 2017-04-03

## 2017-04-03 NOTE — Consults (Signed)
NEUROSURGERY CONSULTATION    Date Time: 04/03/17 11:41 AM  Patient Name: Dillon Wood, Dillon Wood  Requesting Physician: Tomma Rakers, *       Reason for Consultation:   Small left orbital floor fracture    History:   Dillon Wood is a 83 y.o. right handed male who presents to the hospital on 04/02/2017 with a small orbital floor blowout fracture when he fell at home earlier today. Patient tripped and struck his face on the edge of the counter top, with no loss of consciousness, and ambulatory on the scene after the fall. Patient was initially evaluated at [facility], where a CT was performed, showing the injury as noted, prompting transfer to [facility].    Past Medical History:     Past Medical History:   Diagnosis Date   . Gastroesophageal reflux disease    . Hyperlipemia        Past Surgical History:     Past Surgical History:   Procedure Laterality Date   . ABDOMINAL SURGERY      Hernia repair       Family History:   No family history on file.    Social History:     Social History     Social History   . Marital status: Married     Spouse name: N/A   . Number of children: N/A   . Years of education: N/A     Social History Main Topics   . Smoking status: Never Smoker   . Smokeless tobacco: Never Used   . Alcohol use Yes   . Drug use: No   . Sexual activity: Not on file     Other Topics Concern   . Not on file     Social History Narrative   . No narrative on file       Allergies:     Allergies   Allergen Reactions   . Penicillins      Disorientation       Medications:       Review of Systems:   A comprehensive review of systems was: Negative except noted in HPI. All other systems were reviewed and are negative    Physical Exam:     Vitals:    04/03/17 1046   BP: 121/67   Pulse: 74   Resp: 18   Temp: 97.7 F (36.5 C)   SpO2: 98%       Intake and Output Summary (Last 24 hours) at Date Time    Intake/Output Summary (Last 24 hours) at 04/03/17 1141  Last data filed at 04/03/17 0600   Gross per 24 hour   Intake                0  ml   Output              150 ml   Net             -150 ml     General Appearance: elderly male, no obvious acute distress    Physical Exam:   Heart: RRR  Lungs: CTA  Abdomen: SNT    Awake alert oriented x 3 , appropriate.  Wife at bedside, patient is ambulating    Speech is clear and fluent   PERRL 4 mm EOM: intact, face: left facial edema from fx's, Tongue: midline   Cranial nerves:   -CN II: Visual fields full to bedside confrontation   -CN III, IV, VI: Pupils equal, round, and reactive  to light; extraocular movements intact; no ptosis          EOMi without diplopia in all fields of gaze                          -CN V: Facial sensation intact in V1 through V3 distributions   -CN VII: left facial edema with ecchymosis/hematoma  -CN VIII: Hearing intact to conversational speech   -CN IX, X: Palate elevates symmetrically; normal phonation   -CN XI: Symmetric full strength of sternocleidomastoid and trapezius muscles   -CN XII: Tongue protrudes midline    Labs Reviewed:     Lab Results   Component Value Date    WBC 7.67 04/02/2017    HGB 12.8 (L) 04/02/2017    HCT 37.0 (L) 04/02/2017    MCV 90.9 04/02/2017    PLT 136 (L) 04/02/2017     Lab Results   Component Value Date    NA 140 04/02/2017    K 3.5 04/02/2017    CL 108 04/02/2017    CO2 23 04/02/2017     Lab Results   Component Value Date    INR 1.1 04/02/2017    PT 13.8 04/02/2017       Rads:     CT Head without Contrast [[id number]] (Order [id number])   Status: Final result   Study Result     HISTORY: fall headache,     TECHNIQUE: Routine  head CT without contrast.  Dose reduction with automatic exposure control, iterative  reconstruction, and/or adjustment of the mA and/or kV according to  patient size.  COMPARISON: None.  FINDINGS:   There is minimal high density about the right frontoparietal junction  sulci finding that may represent subtle subarachnoid hemorrhage. No  evidence of mass producing hematoma.   There is no evidence of acute ischemia.  There is no mass,  mass effect, hemorrhage, or extra-axial fluid  collection.   The ventricles and the basal cisterns are unremarkable.  Left facial subcutaneous swelling noted. There is normal aeration of the  mastoid air cells. The skull is normal without fracture.     These  findings discussed and acknowledged with emergency physician in charge  of patient, at  04/02/2017 4:59 PM.     IMPRESSION:      1.  Possible very subtle subarachnoid hemorrhage about the right  parietal frontoparietal region. No evidence of mass producing hematoma.                  Einar Pheasant, MD   04/02/2017 5:01 PM     CT Maxillofacial Bones [[id number]] (Order [id number])   Status: Final result   Study Result     HISTORY: Posttraumatic pain    COMPARISON: None    TECHNIQUE: Noncontrast CT imaging of the face was performed in the axial  plane. Sagittal and coronal reformatted images were provided.    The following dose reduction techniques were utilized: automated  exposure control and/or adjustment of the mA and/or kV according to  patient size, and the use of iterative reconstruction technique.      FINDINGS: There is a soft tissue hematoma about the left face centered  in the left maxillary region. There is a fracture of the left orbital  floor and left maxillary sinus without significant fluid within the  paranasal sinuses or nasal fossa. There is a retention cyst along the  floor the left maxillary sinus. No intraocular abnormality or  lens  displacement is seen. The orbital fat planes appear maintained. The  extraocular muscles and optic nerves are symmetric. The balance of the  facial skeleton appears intact. The mastoid and middle ear cavity  regions are clear. There is rightward nasal septal deviation.    IMPRESSION:     1. Left facial soft tissue hematoma.  2. Left maxillary sinus and left orbital floor fractures.    Otho Ket, MD   04/02/2017 5:14 PM       Assessment:   83 year old male with tiny wisp of traumatic subarachnoid hemorrhage,  high right convexity, s/p fall    Plan:   Admit to Trauma Service  Left orbital minimally displaced fracture.  Offer observation and followup - does not meet surgical indications.    Patient lives in [place] and can followup there - recommend [facility].    Findings and Plans discussed with Trauma team and patient and patient's wife who is also present.        Signed by: Para March, MD  Date/Time: 04/03/17 11:41 AM

## 2017-04-03 NOTE — Discharge Summary -  Nursing (Signed)
Charge nurse Marisue Humble, brought AVS report to pt and pt's spouse & daughter. Pt became in a hurry to leave. AVS signed by pt and paper prescriptions provided to take to an outside pharmacy.     Clin tech removed IV.     Imaging discs picked up and provided to pt. Trauma team wrote letter for airline to note pt was hospitalized.     The discharge was rushed due to pt and family needing to d/c right away. I did not go though all the instructions as pt and family wanted to leave right away.     Pt wheelchaired down to [facility] lobby to go home.

## 2017-04-03 NOTE — Final Progress Note (DC Note for stay less than 48 (Signed)
TRAUMA TERTIARY SURVEY FORM AND FINAL PROGRESS NOTE       Interval History:   Dillon Wood is a 83 y.o. male who was admitted 04/02/2017  7:26 PM to [facility] by Tomma Rakers, * after Fall: Yes. Mechanical fall; tripped and hit head on countertop.  Denies taking blood thinners or ASA.    Substance usage:   social drinker  The patient denies current or previous tobacco use.  denied.    SBRT (Screening, Brief Intervention, and Referral to Treatment) performed: Yes  CATS (Community addiction team) requested: N/A    Allergies:           Allergies   Allergen Reactions   . Penicillins      Disorientation       Medical history:     Past Medical History   Past Medical History:   Diagnosis Date   . Gastroesophageal reflux disease    . Hyperlipemia           Medications:     Prior to Admission medications    Not on File       Current Inpatient Medications with Last Dose Taken            Current Facility-Administered Medications   Medication Dose Route Frequency Last Rate Last Dose   . acetaminophen (TYLENOL) tablet 500 mg  500 mg Oral 4 times per day   500 mg at 04/03/17 0627   . lactated ringers infusion  100 mL/hr Intravenous Continuous 100 mL/hr at 04/03/17 0358 100 mL/hr at 04/03/17 0358   . naloxone [facility]) injection 0.2 mg  0.2 mg Intravenous PRN       . ondansetron (ZOFRAN) tablet 4 mg  4 mg Oral Q8H PRN        Or   . ondansetron (ZOFRAN) injection 4 mg  4 mg Intravenous Q8H PRN       . traMADol (ULTRAM) tablet 50 mg  50 mg Oral Q6H PRN               Verify appropriate medications are reconciled: Yes    Review of systems since admission:   Review of Systems   Constitutional: Negative for chills and fever.   Eyes: Negative for blurred vision and double vision.   Respiratory: Negative for shortness of breath.    Cardiovascular: Negative for chest pain and palpitations.   Gastrointestinal: Negative for abdominal pain, nausea and vomiting.   Musculoskeletal:  Negative for back pain, joint pain and neck pain.   Neurological: Negative for dizziness and headaches.       Available radiology data:         Radiology Results (24 Hour)     ** No results found for the last 24 hours. **          Current laboratory data:     Labs in the last 24 hours       Results     ** No results found for the last 24 hours. **             Physical examination:   Physical Exam   Constitutional: He is oriented to person, place, and time.   HENT:   Ecchymosis and swelling to left side of face   Eyes: Pupils are equal, round, and reactive to light. EOM are normal.   Neck:   No stepoffs over cervical spine   Cardiovascular: Normal rate, regular rhythm and intact distal pulses.  Pulmonary/Chest: Effort normal and breath sounds normal. No respiratory distress. He has no wheezes. He exhibits no tenderness.   RA   Abdominal: Soft. Bowel sounds are normal. He exhibits no distension. There is no tenderness.   Genitourinary:   Genitourinary Comments: No foley   Musculoskeletal: Normal range of motion. He exhibits no edema or deformity.   BUE, BLE grossly normal, ROM/Neurovasc intact  CTL spine w/ no deformities, no stepoffs over TL spine  No TTP over bony prominences   Neurological: He is alert and oriented to person, place, and time. GCS score is 15.   Skin: Skin is warm and dry.       Vital signs:  Temp:  [97.6 F (36.4 C)-98.4 F (36.9 C)] 98.4 F (36.9 C)  Heart Rate:  [64-76] 64  Resp Rate:  [16-18] 18  BP: (131-167)/(64-100) 142/73    List of injuries by system:   HEENT: Left orbital floor fx; Left maxillary sinus fx. and Neurologic: possible R parietal SAH    Problem list:         Patient Active Problem List   Diagnosis   . Traumatic subarachnoid hemorrhage without loss of consciousness, initial encounter   . Closed fracture of left side of maxilla   . Left orbit fracture, closed, initial encounter   . Facial contusion, initial encounter   . Acute pain due to trauma        Verify problem list is appropriately updated: Yes    Consulting services:   Neurosurgery - Adrienne Mocha and Plastics - Jolyne Loa consulting services have been notified: Yes    Assessment and plan:   Neurological: CT head w/ subtle R SAH.  GCS 15.  No AEDs.  Multimodal pain control w/ tylenol and prn ultram  HEENT:  Left orbital floor fx; left maxillary sinus fx.  Will follow up with PRS outpatient.  Will start on Clindamycin.   Orthopedic: NAI  Pulmonary: On RA; continue pulmonary toileting  Cardiac: HDS, continue to monitor  Endocrine: NAI  Gastrointestinal: tolerating regular diet, continue BR  Renal: Voiding freely, continue to monitor  Hematologic/ID: Afebrile.    Candidate for Lovenox: No    Neuromuscular: NAI; able to ambulate without pt/ot assistance/evaluation  Cervical spine clearance: Yes  Psychiatric: Supportive care  Wounds: LWC PRN      Discharge Medications:     Current Discharge Medication List      START taking these medications    Details   acetaminophen (TYLENOL) 500 MG tablet Take 1 tablet (500 mg total) by mouth every 6 (six) hours.      clindamycin (CLEOCIN) 300 MG capsule Take 1 capsule (300 mg total) by mouth every 8 (eight) hours.for 5 days  Qty: 15 capsule, Refills: 0      traMADol (ULTRAM) 50 MG tablet Take 0.5 tablets (25 mg total) by mouth every 6 (six) hours as needed for Pain.  Qty: 10 tablet, Refills: 0             Disposition:   Home     Discharge Instructions:     Reason for your Hospital Admission:  [facility]  Left orbital floor fracture  Left maxillary sinus fracture      Instructions for after your discharge:  TRAUMA SERVICE DISCHARGE INSTRUCTIONS    [address], [address]  [place], [place]  [address]  Phone [phone], [place] [phone]      Discharge Instructions and Follow Up Information:  -Your primary physician team  during your stay was the Acute Care Surgery service (ACS) (also known as Trauma), whose surgeons specialize in General Surgery, Trauma Surgery, and  Critical Care. Our surgeons are Board Certified in both General Surgery and Surgical Critical Care.    You have been provided with a list of doctors (see above) who treated you during your hospitalization.  Not all patients need to follow up with the surgeon.  If instructed to do so, please call [phone] within 48 hours of your discharge To make a Clinic appointment for the following date:  The patient does not need to follow up with Trauma Acute Care Surgery at this time; however, the Trauma service is always available should there be any questions or concerns. Please call [phone] for any questions or concerns.         The address is [address].     If you have forms that need a doctor's signature:  - If you have insurance forms, FMLA forms, or worker's compensation forms that need to be filled out by Designer, industrial/product, please mail or fax the forms to our office at the following address:  Trauma Services  [address], [address]  [place], [place] [address]  Fax: [phone]    Pain Medication  The ACS service can manage your general postoperative or post-trauma pain. If your pain is from injuries or surgery that was handled by another surgery team or consultant, such as Orthopedics, Neurosurgery, or Spine surgery, we kindly request that you call that surgeon's office to address your pain. If you have follow-up with the Trauma Service, please call the Trauma Service Office for medication refills at [phone].   Please plan accordingly as refills can take up to 24-48 hours.    Taking Pain Medications  It is important to take your medications on time and never take more than the prescribed amount.  Use your pain medication only as directed. Take only the medication that your health care provider tells you to take.  Take your pain medications with some food to avoid an upset stomach.  Remember medications take time to work.  Most pain relievers taken by mouth take at least 20-30  minutes to take effect.  So take your medication at regular times as directed.  Don't wait until the pain gets bad to take it.  Try to time your medication so that you take it before the beginning of an activity, such as dressing or sitting at the table for dinner.  Taking your medication at night may help you get a good night's rest.  If the pain lessens, try taking your pain medication less often. Start by trying to eliminate a dose of pain medication at a time of day when you experience less pain.  If you are not able to completely eliminate the dose, try replacing the dose with a dose of acetaminophen (Tylenol), unless you have been directed not to use acetaminophen use by a physician. In this way, you can slowly wean your use of pain medication as your pain decreases, continuing to eliminate or replace other doses as described above, until you no longer need to take any medicine for pain.   Constipation is a common side effect of pain medications.  Eating lots of fruits and vegetables and drinking lots of water helps relieve constipation.  You should be taking a stool softener (ex. Colace) as long as you are on any narcotic pain medication.  You should be having a bowel  movement at least every 2-3 days.  If not, then proceed with over the counter laxatives (Senokot -S/Miralax), enemas, or suppositories to fix the constipation. You can obtain these at your local pharmacy if you develop constipation.     Avoid drinking alcohol while taking pain medicine.    Avoid driving or operating machinery while taking pain medication.   Please keep track of how many pills you have left so you will not completely run out before you need a refill.     Call your doctor if you notice any of these symptoms:    . Nausea, vomiting, diarrhea, lasting constipation, or stomach cramps  . Breathing problems or a fast heart rate  . Feeling very tired, sluggish, or dizzy  . Skin rash  . Pain that is not being treated by the prescribed  amount of medication          Follow up     Please follow up with a plastic surgeon in your area regarding your facial fractures - please take all of your clindamycin dosing for 5 days   Please follow up with a neurosurgeon in your area   No aspirin, NSAIDS, or blood thinners for 4 weeks         Activity: activity as tolerated, No driving for 2 weeks.  avoid activities which cause pain. Walking and climbing up stairs are ok, but avoid heavy lifting for the next 3-4 weeks. No driving while taking narcotic pain medication.    Diet: regular diet .The patient is instructed to drink plenty of fluids to maintain adequate hydration.    Wound Care: none needed . Showering is ok, but no baths or submerging incision in standing water for 2-3 weeks.     Follow Up:   Para March, MD  [address]  [place] [address]  [phone]    Schedule an appointment as soon as possible for a visit  Please call to make follow up appointment    Please follow up with your primary care provider in your area.    Pincus Badder, FNP-C  Trauma Acute Care Surgery  [facility]  [phone]; [phone]        Trauma surgeon attestation:   I saw and examined the patient at 1305h. My exam and findings duplicated as documented in the note above by. I have personally added to the text of this note to reflect my exam and medical assessment and plan.  I have personally reviewed the patient's history and 24 hour interval events, along with vitals, labs, radiology images, ventilator settings and additional findings found in detail within the note above. I concur with the above documented assessment and care plan which was developed with and reviewed by me except as documented below.     IMP/PLAN  Neuro-TBI, SAH neuro intact, GCS 15; multimodal pain regimen for acute pain    Max sinus/orbital floor fx nonop per plastics  Home with family, will f/u in [place] as outpt with PCP, NSY, plastics    Left facial edema, periorbital  and malar ecchymosis, PERRL/EOMI, nl nasal/oral mucosa   no stridor, trachea midline   Lungs CTA bilat/symmetric   CV: RRR; 2+radial/dp bilat   Abd: soft, nt/nd  Ext: no deform; MAE  Skin: no rashes/lesions/ulcers   Neuro: GCS 15; no focal deficits   Psych: Nl mood and affect    Larwance Sachs, MD, FACS  Acute Care Surgery

## 2017-04-03 NOTE — Plan of Care (Signed)
Problem: Safety  Goal: Patient will be free from injury during hospitalization  Outcome: Progressing   04/03/17 1610   Goal/Interventions addressed this shift   Patient will be free from injury during hospitalization  Assess patient's risk for falls and implement fall prevention plan of care per policy;Provide and maintain safe environment;Use appropriate transfer methods;Ensure appropriate safety devices are available at the bedside;Include patient/ family/ care giver in decisions related to safety;Hourly rounding;Assess for patients risk for elopement and implement Elopement Risk Plan per policy;Provide alternative method of communication if needed (communication boards, writing)       Problem: Side Effects from Pain Analgesia  Goal: Patient will experience minimal side effects of analgesic therapy  Outcome: Progressing   04/03/17 9604   Goal/Interventions addressed this shift   Patient will experience minimal side effects of analgesic therapy Monitor/assess patient's respiratory status (RR depth, effort, breath sounds);Assess for changes in cognitive function;Prevent/manage side effects per LIP orders (i.e. nausea, vomiting, pruritus, constipation, urinary retention, etc.);Evaluate for opioid-induced sedation with appropriate assessment tool (i.e. POSS)       Comments: Tylenol po per schedule for pain control, no nausea, abdomen soft, small bm this am, has voided, one assist to BR r/t left side of face very bruised and for safety, glasses are for reading per patient. Ivmf infusing without any difficulty, npo since arrival at 03:00 am per orders. Continue to monitor.

## 2017-04-03 NOTE — Consults (Signed)
Full consult to follow.     I have reviewed CT scan of facial injuries - left orbital floor fracture.  This will be non-operative management with f/u as outpatient in my office.      Recommend oral abx for 5-7 days.    Isabell Bonafede K Demontae Antunes,MD

## 2017-04-03 NOTE — Discharge Instr - AVS First Page (Addendum)
Reason for your Hospital Admission:  [facility]  Left orbital floor fracture  Left maxillary sinus fracture      Instructions for after your discharge:  TRAUMA SERVICE DISCHARGE INSTRUCTIONS    [address], [address]  [place], [place]  [address]  Phone [phone], [place] [phone]      Discharge Instructions and Follow Up Information:  -Your primary physician team during your stay was the Acute Care Surgery service (ACS) (also known as Trauma), whose surgeons specialize in General Surgery, Trauma Surgery, and Critical Care. Our surgeons are Board Certified in both General Surgery and Surgical Critical Care.    You have been provided with a list of doctors (see above) who treated you during your hospitalization.  Not all patients need to follow up with the surgeon.  If instructed to do so, please call [phone] within 48 hours of your discharge To make a Clinic appointment for the following date:  The patient does not need to follow up with Trauma Acute Care Surgery at this time; however, the Trauma service is always available should there be any questions or concerns. Please call [phone] for any questions or concerns.         The address is [address].     If you have forms that need a doctor's signature:  - If you have insurance forms, FMLA forms, or worker's compensation forms that need to be filled out by Designer, industrial/product, please mail or fax the forms to our office at the following address:  Trauma Services  [address], [address]  [place], [place] [address]  Fax: [phone]    Pain Medication  The ACS service can manage your general postoperative or post-trauma pain. If your pain is from injuries or surgery that was handled by another surgery team or consultant, such as Orthopedics, Neurosurgery, or Spine surgery, we kindly request that you call that surgeon's office to address your pain. If you have follow-up with the Trauma Service, please call the Trauma Service Office for  medication refills at [phone].   Please plan accordingly as refills can take up to 24-48 hours.    Taking Pain Medications  It is important to take your medications on time and never take more than the prescribed amount.  Use your pain medication only as directed. Take only the medication that your health care provider tells you to take.  Take your pain medications with some food to avoid an upset stomach.  Remember medications take time to work.  Most pain relievers taken by mouth take at least 20-30 minutes to take effect.  So take your medication at regular times as directed.  Don't wait until the pain gets bad to take it.  Try to time your medication so that you take it before the beginning of an activity, such as dressing or sitting at the table for dinner.  Taking your medication at night may help you get a good night's rest.  If the pain lessens, try taking your pain medication less often. Start by trying to eliminate a dose of pain medication at a time of day when you experience less pain.  If you are not able to completely eliminate the dose, try replacing the dose with a dose of acetaminophen (Tylenol), unless you have been directed not to use acetaminophen use by a physician. In this way, you can slowly wean your use of pain medication as your pain decreases, continuing to eliminate or replace other doses  as described above, until you no longer need to take any medicine for pain.   Constipation is a common side effect of pain medications.  Eating lots of fruits and vegetables and drinking lots of water helps relieve constipation.  You should be taking a stool softener (ex. Colace) as long as you are on any narcotic pain medication.  You should be having a bowel movement at least every 2-3 days.  If not, then proceed with over the counter laxatives (Senokot -S/Miralax), enemas, or suppositories to fix the constipation. You can obtain these at your local pharmacy if you develop constipation.      Avoid drinking alcohol while taking pain medicine.    Avoid driving or operating machinery while taking pain medication.   Please keep track of how many pills you have left so you will not completely run out before you need a refill.     Call your doctor if you notice any of these symptoms:    . Nausea, vomiting, diarrhea, lasting constipation, or stomach cramps  . Breathing problems or a fast heart rate  . Feeling very tired, sluggish, or dizzy  . Skin rash  . Pain that is not being treated by the prescribed amount of medication          Follow up     Please follow up with a plastic surgeon in your area regarding your facial fractures - please take all of your clindamycin dosing for 5 days   Please follow up with a neurosurgeon in your area   No aspirin, NSAIDS, or blood thinners for 4 weeks

## 2017-04-03 NOTE — UM Notes (Signed)
04/02/17 2334  Place (admit) on Observation Services       83 y.o. male who presents to the hospital after Fall: Fall from # of stairs: 1.  Patient was in his daughter's home (visiting from [place]) when he missed a stair and fell, hitting his head on a cabinet. He denies loss of consciousness, confirmed by the two witnesses who were there (wife and daughter). He initially went to [facility], where he was found to have possible right frontoparietal SAH, left maxillary sinus fracture, and orbital floor fracture. Negative CT C-spine. He reports left frontal headache and no other symptoms. Walked under his own power after the fall. No history of anticoagulation   Diagnosis   . Traumatic subarachnoid hemorrhage without loss of consciousness, initial encounter   . Closed fracture of left side of maxilla   . Left orbit fracture, closed, initial encounter   . Facial contusion, initial encounter   . Acute pain due to trauma       Plan by systems:  Neuro: subtle right frontoparietal SAH.   -neurosurgery consult  -no current indication for AEDs  -q4h neuro check  -pain control prn  Pulm:  -pulmonary toilet  CV: history of HLD  Continue statin  Endo:  No active issues  GI:   NPO, IVF while monitoring neuro exam  Neuromuscular: left orbital floor fracture, maxillary fracture, extensive facial edema  -plastic surgery consult consult  Psych: no active issues, good family support. Wife and daughter present at bedside  Wounds: small laceration left maxillary region  No repair indicated    Admit to ward    Neurosurgery consult:   Assessment:   83 year old male with tiny wisp of traumatic subarachnoid hemorrhage, high right convexity, s/p fall    Plan:   Admit to Trauma Service  Medical management per Trauma Team  q 4 hour neuro checks  No anticoagulation/ASA/NSAIDs  No AED unless patient has a seizure  Repeat CT for neuro decline  HOB> 30 degrees  No acute neurosurgical intervention at this time    Plan discussed with and  developed by Dr. Adrienne Mocha    ICP monitor: Patient does not meet criteria for placement, as: patient is following commands, patient has a neurologic exam that can be followed and this is not a severe TBI    Plastic surgery consult:   Full consult to follow.     I have reviewed CT scan of facial injuries - left orbital floor fracture.  This will be non-operative management with f/u as outpatient in my office.  Recommend oral abx for 5-7 days.    CT maxillofacial bones: 1. Left facial soft tissue hematoma.  2. Left maxillary sinus and left orbital floor fractures.    CT head: 1. Possible very subtle subarachnoid hemorrhage about the right  parietal frontoparietal region. No evidence of mass producing hematoma.     Vs-97.8, 76, 18, 164/100, 96% ra,   NPO, ivf @ 100, incentive spirometry q4h,     Cordelia Pen MSN, [employer], ACM  Utilization Review Case Manager  [employer]  [phone]

## 2017-04-03 NOTE — Progress Notes (Signed)
This note also relates to the following rows which could not be included:  LACE Score for RWB Report - Cannot attach notes to read only rows       04/03/17 1535   Discharge Disposition   Patient preference/choice provided? Yes   Physical Discharge Disposition Home   Mode of Transportation Car  (family )   Patient/Family/POA notified of transfer plan Yes   Patient agreeable to discharge plan/expected d/c date? Yes   Family/POA agreeable to discharge plan/expected d/c date? Yes   Bedside nurse notified of transport plan? Yes   CM Interventions   Follow up appointment scheduled? No   Multidisciplinary rounds/family meeting before d/c? Yes   Medicare Checklist   Is this a Medicare patient? Yes     Debby Bud, MSW  Social Worker II  [facility]  [phone]  on [place] call [phone]

## 2017-04-03 NOTE — PT Eval Note (Signed)
[facility]   Physical Therapy Evaluation     Patient: Dillon Wood    MRN#: [id number]   Unit: [facility] TOWER 3  Bed: F343/F343.01    Discharge Recommendations:   Discharge Recommendation: Home with supervision (as needed)       DME Recommendation: none from PT standpoint    .        Assessment:   Dillon Wood is a 83 y.o. male admitted 04/02/2017.  Pt's functional mobility is impacted by:  pain.  There are a few comorbidities or other factors that affect plan of care and require modification of task including: has stairs to manage.  Standardized tests and exams incorporated into evaluation include AMPAC mobility, balance, cognition/orientation, coordination, ROM  and Strength.  Pt demonstrates a stable clinical presentation.   Pt would continue to benefit from PT to address these deficits and increase functional independence.       Therapy Diagnosis: normal gait, safe mobility    Rehabilitation Potential: good    Treatment Activities: evaluation only, pt education  Educated the patient to role of physical therapy, plan of care, goals of therapy and safety with mobility and ADLs, home safety.    Plan:   PT Frequency: one time visit    Treatment/Interventions: d/c PT    Risks/benefits/POC discussed with patient and family       Precautions and Contraindications:   None from PT standpoint    Consult received for Dillon Wood for PT Evaluation and Treatment.  Patient's medical condition is appropriate for Physical Therapy intervention at this time.    History of Present Illness:   Dillon Wood is a 82 y.o. male admitted on 04/02/2017 after fall down stairs and struck head on cabinet.  Denies LOC. Imaging revealed small R parietal t SAH, facial fx.      Medical Diagnosis: Subarachnoid hemorrhage [I60.9]  Closed fracture of orbit, initial encounter [S02.80XA]  Closed fracture of left side of maxilla, initial encounter [S02.40DA]    Past Medical/Surgical History:  SCC and BCC of head,  excised  Inguinal hernia repair    Imaging/Tests/Labs:  CT head:  IMPRESSION:     1. Possible very subtle subarachnoid hemorrhage about the right  parietal frontoparietal region. No evidence of mass producing hematoma.     CT maxillofacial:  IMPRESSION:     1. Left facial soft tissue hematoma.  2. Left maxillary sinus and left orbital floor fractures.      Social History:   Prior Level of Function: independent  Assistive Devices: none  Baseline Activity: independent, plays tennis and walks regularly.  DME Currently at Home: none  Home Living Arrangements: with spouse in [place]. Visiting daughter here.  Type of Home: [facility]   Home Layout: multilevel (daughters home)    Subjective:   Patient is agreeable to participation in the therapy session. Nursing clears patient for therapy.     Patient Goal: to go home    Pain:   Scale: denies significant pain  Location: face  Intervention: declines need for pain meds    Objective:   Patient is seated in a bedside chair with peripheral IV in place.  L facial bruising periorbital.      Cognitive Status and Neuro Exam:  A and O x 3. Follows all commands.  Sensation: grossly intact to light touch throughout  Coordination: grossly equal speed and quality.    Musculoskeletal Examination  RUE ROM: WFL  LUE ROM: [facility]  RLE ROM: WFL  LLE ROM: WFL    RUE Strength: grossly 5/5  LUE Strength: grossly 5/5  RLE Strength: grossly 5/5  LLE Strength: grossly 5/5    Functional Mobility  Rolling: n/a  Supine to Sit: n/a  Scooting: independent  Sit to Stand: independent  Stand to Sit: independent  Transfers: independent        Ambulation  PMP - Progressive Mobility Protocol   PMP Activity: Step 7 - Walks out of Room  Distance Walked (ft) (Step 6,7): 300 Feet   Level of assistance required: independent  Pattern: normal pattern and cadence  Device Used: none  Weightbearing Status: no restrictions  Stair Management: independent  Number of Stairs: 10        Balance  Static Sitting: good  Dynamic  Sitting: good  Static Standing: good  Dynamic Standing: good    Participation and Activity Tolerance  Participation Effort: good  Endurance: good    AM-PACT Inpatient Short Forms  Inpatient AM-PACT Performed? (PT): Basic Mobility Inpatient Short Form  AM-PACT "6 Clicks" Basic Mobility Inpatient Short Form  Turning Over in Bed: None  Sitting Down On/Standing From Armchair: None  Lying on Back to Sitting on Side of Bed: None  Assist Moving to/from Bed to Chair: None  Assist to Walk in Hospital Room: None  Assist to Climb 3-5 Steps with Railing: None  PT Basic Mobility Raw Score: 24  CMS 0-100% Score: 0.00%      Based on [employer] AM-PACT - Basic Mobility Inpatient (Short Form) score, functional assessment, and clinical judgment:     Mobility, Current Status (W4132): CH    Mobility, Goal Status (G4010): CH    Mobility, D/C Status (U7253): CH      Attention MDs:   Thank you for allowing [place] to participate in the care of Dillon Wood. Regulations from the [employer] for Medicare and Medicaid Services (CMS) require your review and approval of this plan of care.     Please cosign this note indicating you are in agreement with the PT Plan of Care so we may initiate the therapy treatment plan.       Patient left with call bell within reach, all needs met, SCDs not in place as found, fall mat n/a, bed alarm n/a, chair alarm n/a and all questions answered. RN notified of session outcome and patient response.     Goals:     D/c PT    Mitchel Honour) Craige Cotta, PT, DPT  Pager [phone]      Time of Treatment  PT Received On: 04/03/17  Start Time: 1455  Stop Time: 1530  Time Calculation (min): 35 min

## 2017-04-04 LAB — ECG 12-LEAD
Atrial Rate: 67 {beats}/min
P Axis: 66 degrees
P-R Interval: 200 ms
Q-T Interval: 460 ms
QRS Duration: 140 ms
QTC Calculation (Bezet): 486 ms
R Axis: -71 degrees
T Axis: 34 degrees
Ventricular Rate: 67 {beats}/min

## 2020-05-15 ENCOUNTER — Other Ambulatory Visit: Payer: Self-pay

## 2020-05-15 ENCOUNTER — Ambulatory Visit: Admission: EM | Admit: 2020-05-15 | Discharge: 2020-05-15 | Discharge status: Against Medical Advice | Payer: Self-pay

## 2020-05-15 ENCOUNTER — Emergency Department (HOSPITAL_COMMUNITY)
Admission: EM | Admit: 2020-05-15 | Discharge: 2020-05-15 | Disposition: A | Discharge status: Home / Self Care | Payer: Medicare Other | Attending: Emergency Medicine | Admitting: Emergency Medicine

## 2020-05-15 ENCOUNTER — Encounter (HOSPITAL_COMMUNITY): Payer: Self-pay | Admitting: Emergency Medicine

## 2020-05-15 ENCOUNTER — Emergency Department (HOSPITAL_COMMUNITY): Payer: Medicare Other

## 2020-05-15 DIAGNOSIS — R42 Dizziness and giddiness: Secondary | ICD-10-CM | POA: Insufficient documentation

## 2020-05-15 LAB — CBC
HCT: 40.7 % (ref 39.0–52.0)
Hemoglobin: 13.5 g/dL (ref 13.0–17.0)
MCH: 30 pg (ref 26.0–34.0)
MCHC: 33.2 g/dL (ref 30.0–36.0)
MCV: 90.4 fL (ref 80.0–100.0)
Platelets: 154 10*3/uL (ref 150–400)
RBC: 4.5 MIL/uL (ref 4.22–5.81)
RDW: 14 % (ref 11.5–15.5)
WBC: 8.1 10*3/uL (ref 4.0–10.5)
nRBC: 0 % (ref 0.0–0.2)

## 2020-05-15 LAB — BASIC METABOLIC PANEL
Anion gap: 9 (ref 5–15)
BUN: 18 mg/dL (ref 8–23)
CO2: 25 mmol/L (ref 22–32)
Calcium: 9.3 mg/dL (ref 8.9–10.3)
Chloride: 104 mmol/L (ref 98–111)
Creatinine, Ser: 1.29 mg/dL — ABNORMAL HIGH (ref 0.61–1.24)
GFR, Estimated: 53 mL/min — ABNORMAL LOW (ref >60)
Glucose, Bld: 105 mg/dL — ABNORMAL HIGH (ref 70–99)
Potassium: 3.9 mmol/L (ref 3.5–5.1)
Sodium: 138 mmol/L (ref 135–145)

## 2020-05-15 MED ORDER — MECLIZINE HCL 12.5 MG PO TABS: 12.5000 mg | ORAL_TABLET | Freq: Three times a day (TID) | ORAL | 0 refills | Status: AC | PRN

## 2020-05-15 NOTE — Discharge Instructions (Addendum)
Continue your home medications.  Take the meclizine as needed for dizziness.  Return as needed for worsening or recurrent symptoms

## 2020-05-15 NOTE — ED Provider Notes (Signed)
[facility] EMERGENCY DEPARTMENT Provider Note   CSN: [id number] Arrival date & time: 05/15/20  1538     History Chief Complaint  Patient presents with  . Dizziness    Angel Cooley is a 86 y.o. male.  HPI   Pt had episodes of feeling dizzy and feeling unsteady.  He had several episodes this morning.  It occurs at rest and while moving.  He had to hold on to objects to not lose his balance.  He did not feel that the room was spinningl.  No heart racing.  No falling to one side.    Past medical history: DVT , vertigo   History reviewed. No pertinent surgical history.     History reviewed. No pertinent family history.  Social History   Tobacco Use  . Smoking status:  Patient does not smoke  Substance Use Topics  . Alcohol use: Not on file  . Drug use:  Denies    Home Medications Prior to Admission medications   Medication Sig Start Date End Date Taking? Authorizing Provider  meclizine (ANTIVERT) 12.5 MG tablet Take 1 tablet (12.5 mg total) by mouth 3 (three) times daily as needed for dizziness. 05/15/20   Linwood Dibbles, MD    Allergies    Penicillins and Sulfa antibiotics  Review of Systems   Review of Systems  All other systems reviewed and are negative.   Physical Exam Updated Vital Signs BP (!) 150/73   Pulse 74   Temp 98 F (36.7 C) (Oral)   Resp 18   SpO2 98%   Physical Exam Vitals and nursing note reviewed.  Constitutional:      General: He is not in acute distress.    Appearance: He is well-developed.  HENT:     Head: Normocephalic and atraumatic.     Right Ear: External ear normal.     Left Ear: External ear normal.  Eyes:     General: No scleral icterus.       Right eye: No discharge.        Left eye: No discharge.     Conjunctiva/sclera: Conjunctivae normal.  Neck:     Trachea: No tracheal deviation.  Cardiovascular:     Rate and Rhythm: Normal rate and regular rhythm.  Pulmonary:     Effort: Pulmonary effort is  normal. No respiratory distress.     Breath sounds: Normal breath sounds. No stridor. No wheezing or rales.  Abdominal:     General: Bowel sounds are normal. There is no distension.     Palpations: Abdomen is soft.     Tenderness: There is no abdominal tenderness. There is no guarding or rebound.  Musculoskeletal:        General: No tenderness.     Cervical back: Neck supple.  Skin:    General: Skin is warm and dry.     Findings: No rash.  Neurological:     Mental Status: He is alert and oriented to person, place, and time.     Cranial Nerves: No cranial nerve deficit (No facial droop, extraocular movements intact, tongue midline ).     Sensory: No sensory deficit.     Motor: No abnormal muscle tone or seizure activity.     Coordination: Coordination normal.     Comments: No pronator drift bilateral upper extrem, able to hold both legs off bed for 5 seconds, sensation intact in all extremities, no visual field cuts, no left or right sided neglect, normal finger-nose exam  bilaterally, no nystagmus noted      ED Results / Procedures / Treatments   Labs (all labs ordered are listed, but only abnormal results are displayed) Labs Reviewed  BASIC METABOLIC PANEL - Abnormal; Notable for the following components:      Result Value   Glucose, Bld 105 (*)    Creatinine, Ser 1.29 (*)    GFR, Estimated 53 (*)    All other components within normal limits  CBC  CBG MONITORING, ED    EKG EKG Interpretation  Date/Time:  Sunday May 15 2020 15:55:16 EST Ventricular Rate:  73 PR Interval:  186 QRS Duration: 124 QT Interval:  406 QTC Calculation: 447 R Axis:   -86 Text Interpretation: Normal sinus rhythm Left axis deviation Right bundle branch block Abnormal ECG No old tracing to compare Confirmed by Linwood Dibbles [phone]) on 05/15/2020 6:05:39 PM   Radiology CT Head Wo Contrast  Result Date: 05/15/2020 CLINICAL DATA:  Dizziness and unsteady gait EXAM: CT HEAD WITHOUT CONTRAST  TECHNIQUE: Contiguous axial images were obtained from the base of the skull through the vertex without intravenous contrast. COMPARISON:  None. FINDINGS: Brain: No evidence of acute territorial infarction, hemorrhage, hydrocephalus,extra-axial collection or mass lesion/mass effect. There is dilatation the ventricles and sulci consistent with age-related atrophy. Low-attenuation changes in the deep white matter consistent with small vessel ischemia. Vascular: No hyperdense vessel or unexpected calcification. Skull: The skull is intact. No fracture or focal lesion identified. Sinuses/Orbits: The visualized paranasal sinuses and mastoid air cells are clear. The orbits and globes intact. Other: None IMPRESSION: No acute intracranial abnormality. Findings consistent with age related atrophy and chronic small vessel ischemia Electronically Signed   By: Jonna Clark M.D.   On: 05/15/2020 17:46    Procedures Procedures (including critical care time)  Medications Ordered in ED Medications - No data to display  ED Course  I have reviewed the triage vital signs and the nursing notes.  Pertinent labs & imaging results that were available during my care of the patient were reviewed by me and considered in my medical decision making (see chart for details).  Clinical Course as of May 15 1830  Wynelle Link May 15, 2020  1817 Labs reviewed.  Creatinine increased but doubt related to his sx.     [JK]  1818 Head CT without acute findings   [JK]    Clinical Course User Index [JK] Linwood Dibbles, MD   MDM Rules/Calculators/A&P                          Patient presented to ED for evaluation of dizziness.  Not clearly vertigo.  Sounds like patient became weak and unsteady.  He does not have any focal neurologic deficits now to suggest stroke.  I did have the patient walk around the ED and he is not having any difficulty.  Laboratory test did not show any evidence of anemia or acute electrolyte abnormality.  I doubt severe  dehydration or sepsis.  Patient does not have any EKG abnormalities to suggest cardiac dysrhythmia.  Patient normally is on vertigo.  Family thinks it is possible he could have had an episode of his vertigo.  He is traveling from [place] and did not bring his meclizine with him.  We will give a prescription for meclizine.  Patient appears stable for discharge.  Patient and family are comfortable with this plan Final Clinical Impression(s) / ED Diagnoses Final diagnoses:  Dizziness  Rx / DC Orders ED Discharge Orders         Ordered    meclizine (ANTIVERT) 12.5 MG tablet  3 times daily PRN        05/15/20 Vashti Hey, MD 05/15/20 [phone]

## 2020-05-15 NOTE — ED Triage Notes (Signed)
Pt arrives to ED with chief complaint of dizziness and off balance episodes that son states lasted about 30 seconds and had about 10 episodes.  He is being treated for a dvt at this time with blood thinner.  He does have history of vertigo but is traveling from [place] and does not have his Antivert for this. Pt is alert and ox4 and no dizziness at this time,

## 2021-02-01 ENCOUNTER — Telehealth (INDEPENDENT_AMBULATORY_CARE_PROVIDER_SITE_OTHER): Payer: Self-pay

## 2021-02-01 NOTE — Telephone Encounter (Signed)
TELEPHONE DOCUMENTATION FOR POTENTIAL ADMISSION    REFERRAL INFORMATION     Caller: [name], daughter    Does patient have a DDM? (Dedicated Decision Maker/POA): Yes - both daughters not wife    Referral Source: [facility]    Confirm Address:     [address] [place] [address]   [place] [address]      Patient's geographic Provider? [place], [place]      What is the best number to call? (bold this # in the chart)  [phone]  daughter Jhon Mallozzi -       ENROLLMENT INFORMATION     Do you have any form of insurance? If yes, confirm and verify what type. [employer] and Medicare      When did the patient stop driving and why?  Last 4 months, having issues with health, radiation therapy for cancer, ulcers on bottom of feet, immobile, difficulty walking  No sore on foot is healed, is improved with walking but he is no longer driving - due to health set back      Is the patient on a ventilator?  No    Are guns and pets secured?  No pets, no guns      Who is current PCP? Dr. Neva Seat    Does patient agree to change Primary Care Provider to [facility] Provider? Yes      What Specialists does pt see regularly? Needs [facility] - oncology, dermatology, urology - needs vascular surgeon aortic issue in neck urgently    Please request last 2 visit notes from all non [place] Providers be faxed to our office. (Provide Fax #)  See MY CHART - please look.    Possibly patient has two charts - has extensive oncology treatment in [place] documented in Epic according to daughter.      Does pt currently take Coumadin or Warfarin? Eliquis twice daily - vitamins, aspirin      Does pt currently take prescription pain medication? 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 Yes    Will the DDM  (Dedicated Decision Maker/POA) be present for Admission visit? Yes      Can you electronically receive and print the Admission Consents and Guidelines or should they dropped in the mail? yes     E-mail address:  send to work:  [email or web address]    Can you electronically return the Admission Consents and Guidelines?  Scan back to [facility]

## 2021-02-06 ENCOUNTER — Encounter (INDEPENDENT_AMBULATORY_CARE_PROVIDER_SITE_OTHER): Payer: Self-pay

## 2021-02-10 ENCOUNTER — Ambulatory Visit: Payer: Medicare Other | Admitting: Internal Medicine

## 2021-02-10 ENCOUNTER — Encounter (INDEPENDENT_AMBULATORY_CARE_PROVIDER_SITE_OTHER): Payer: Self-pay | Admitting: Internal Medicine

## 2021-02-10 DIAGNOSIS — Z7189 Other specified counseling: Secondary | ICD-10-CM | POA: Insufficient documentation

## 2021-02-10 DIAGNOSIS — C760 Malignant neoplasm of head, face and neck: Secondary | ICD-10-CM | POA: Insufficient documentation

## 2021-02-10 DIAGNOSIS — S066X0A Traumatic subarachnoid hemorrhage without loss of consciousness, initial encounter: Secondary | ICD-10-CM

## 2021-02-10 DIAGNOSIS — R413 Other amnesia: Secondary | ICD-10-CM | POA: Insufficient documentation

## 2021-02-10 DIAGNOSIS — N210 Calculus in bladder: Secondary | ICD-10-CM | POA: Insufficient documentation

## 2021-02-10 DIAGNOSIS — G629 Polyneuropathy, unspecified: Secondary | ICD-10-CM | POA: Insufficient documentation

## 2021-02-10 DIAGNOSIS — I82412 Acute embolism and thrombosis of left femoral vein: Secondary | ICD-10-CM | POA: Insufficient documentation

## 2021-02-10 DIAGNOSIS — I6529 Occlusion and stenosis of unspecified carotid artery: Secondary | ICD-10-CM | POA: Insufficient documentation

## 2021-02-10 DIAGNOSIS — I739 Peripheral vascular disease, unspecified: Secondary | ICD-10-CM | POA: Insufficient documentation

## 2021-02-10 DIAGNOSIS — R296 Repeated falls: Secondary | ICD-10-CM | POA: Insufficient documentation

## 2021-02-10 NOTE — Progress Notes (Signed)
Date: 02/10/2021    Patient Name: Dillon Wood, Dillon Wood    Patient was seen in their home (POS 12) in lieu of an office visit for the following reason:   Requires use of an assistive device in order to ambulate due to poor balance.    LEGAL DOCUMENTS and CODE STATUS:   Advance Directive Received: No     Type of Document Received:   needs new forms as moved from [place]        Code Status: FULL    No chief complaint on file.     HPI:   History and ROS obtained from patient, wife, daughters on phone    Dillon Wood is a 87 y.o. year old male I am here to enroll into the medical house calls program.     Problem   Squamous Cell Carcinoma of Head and Neck    Skull.  Removal and radiaiton.      Frequent Falls   Carotid Artery Stenosis   Bladder Stone    Removed recently     Memory Loss    Shrot term issues occasionally     Pvd (Peripheral Vascular Disease)    Sores on L foot.  Had Stent placed L artery.  Wound healed     Angiogram 2021:  Left lower extremity: Occlusion extending from the superficial femoral artery through the tibioperoneal trunk with reconstitution of the peroneal artery through collaterals and single vessel runoff through the foot.   2.       Right lower extremity: Multifocal stenoses as described with single vessel runoff to the foot through the peroneal artery.   3.       Possible component of basilar pulmonary fibrosis, though evaluation is compromised by respiratory motion. This can be further evaluated with nonemergent outpatient high-resolution chest CT.   4.       Course layering bladder calculi.   5.       Enhancement within the prostate is concerning for prostate cancer, further evaluation with PSA and consideration of MRI is advised to better assess.     RECOMMENDATIONS: No additional recommendations.     Preliminary report dictated by: Salomon Fick Preliminary Date: 04/05/2020 9:17 AM     I personally reviewed the image(s) and the report above and concur.     Final Signed by Ephriam Knuckles Signed on  04/05/2020 11:30 AM       Neuropathy   Acute Deep Vein Thrombosis (Dvt) of Femoral Vein of Left Lower Extremity    Small, non occlusive.  10/2020     Advanced Directives, Counseling/Discussion    Discussed Advanced Directive and CODE status.       Traumatic Subarachnoid Hemorrhage Without Loss of Consciousness, Initial Encounter   Facial Contusion, Initial Encounter (Resolved)       Patient seen for establish care.  He has recently moved here from [place] to be near daughters.    Patient recently diagnosed with squamous cell ca on head.  Was attached to bone and has had radiation treatment, recently finished.  He needs DERM ONC in this area for follow up    Patient with recent bladder stone.  He had that removed..    Patient has had issues with PVD.  Has significant leg PVD and possibly underwent procedure but this is not clear from Care Everywhere.  His daughters also state that he has carotid artery stenosis but no one knows which side and there is nothing in Care Everywhere.  Past Medical History:   Diagnosis Date    Facial contusion, initial encounter 04/03/2017    Gastroesophageal reflux disease     Hyperlipemia      Past Surgical History:   Procedure Laterality Date    ABDOMINAL SURGERY      Hernia repair     Family History   Problem Relation Age of Onset    Heart disease Father     Stroke Father     Diabetes Maternal Aunt      Social History     Tobacco Use    Smoking status: Never     Passive exposure: Never    Smokeless tobacco: Never   Vaping Use    Vaping Use: Never used   Substance Use Topics    Alcohol use: Yes     Comment: 3 per week    Drug use: No     Allergies   Allergen Reactions    Penicillins      Disorientation       MEDICATIONS:     Current Outpatient Medications:     apixaban (ELIQUIS) 5 MG, Take 5 mg by mouth every 12 (twelve) hours, Disp: , Rfl:     aspirin EC 81 MG EC tablet, Take 81 mg by mouth daily, Disp: , Rfl:     tamsulosin (FLOMAX) 0.4 MG Cap, Take 0.4 mg by mouth Daily after  dinner, Disp: , Rfl:     ROS:   Review of Systems   Constitutional:  Negative for chills and fever.   Respiratory:  Negative for cough, choking and shortness of breath.    Cardiovascular:  Negative for palpitations and leg swelling.   Gastrointestinal:  Negative for constipation and diarrhea.   Musculoskeletal:  Positive for gait problem (ambulates with cane).   Skin:  Negative for wound.   Neurological:  Negative for headaches.   Psychiatric/Behavioral:  Negative for behavioral problems.      All other systems pertinent to the chief complaint reviewed and otherwise negative     PHYSICAL EXAM:   BP 108/60   Pulse 62   Ht 1.791 m (5' 10.5") Comment: pt report  Wt 72.6 kg (160 lb) Comment: pt report  SpO2 98%   BMI 22.63 kg/m    Wt Readings from Last 1 Encounters:   02/10/21 72.6 kg (160 lb)      Ht Readings from Last 1 Encounters:   02/10/21 1.791 m (5' 10.5")     Physical Exam  Constitutional:       General: He is not in acute distress.     Appearance: He is not ill-appearing, toxic-appearing or diaphoretic.      Comments: Conversant and clear   Eyes:      General: No scleral icterus.     Extraocular Movements: Extraocular movements intact.   Neck:      Vascular: No carotid bruit.   Cardiovascular:      Rate and Rhythm: Normal rate and regular rhythm.      Heart sounds: No murmur heard.  Pulmonary:      Effort: No respiratory distress.      Breath sounds: No wheezing, rhonchi or rales.   Abdominal:      General: There is no distension.      Tenderness: There is no abdominal tenderness. There is no guarding or rebound.   Musculoskeletal:      Cervical back: No rigidity or tenderness.      Right lower leg: No edema.  Left lower leg: No edema.      Comments: Cannot palpate L DP but foot warm and pink.  Scar at heel L from previous wound   Lymphadenopathy:      Cervical: No cervical adenopathy.   Neurological:      General: No focal deficit present.      Mental Status: He is oriented to person, place, and  time.      Comments: 3/3 word recall   Psychiatric:         Mood and Affect: Mood normal.         Behavior: Behavior normal.     DIAGNOSTICS:     Lab Results   Component Value Date    WBC 7.67 04/02/2017    HGB 12.8 (L) 04/02/2017    HCT 37.0 (L) 04/02/2017    PLT 136 (L) 04/02/2017    NA 140 04/02/2017    K 3.5 04/02/2017    CL 108 04/02/2017    CREAT 1.2 04/02/2017    BUN 14 04/02/2017    CO2 23 04/02/2017    INR 1.1 04/02/2017    GLU 96 04/02/2017     No results found.    ASSESSMENT and PLAN:   Squamous cell carcinoma of head and neck  A/P: Needs to have follow up    PVD (peripheral vascular disease)  A/P: Wound healed but needs follow up with Vascular surgery.  Family states he did have a procedure but they are not clear what.      Acute deep vein thrombosis (DVT) of femoral vein of left lower extremity  A/P: Did not see reason for DVT and patient on eliquis.  Will continue     Carotid artery stenosis  A/P: Family describes Carotid artery disease but do not see evidence of that in Care Everywhere.  Patient is to see Vascular surgery    Traumatic subarachnoid hemorrhage without loss of consciousness, initial encounter  A/P: 2018.  Has recovered.  Mild short term memory loss but did well on testing    Neuropathy  A/P: According to daughter. Patient denies any symptoms currently and will monitor clinically    Advanced directives, counseling/discussion  A/P: Patient tearful at discussion of CODE status and relates a TV show where a doctor told patients to have everything done.  Patient wants FULL CODE    Frequent falls  A/P: Just finished PT/OT and has been doing well.  Will continue to use Cane and walker as needed      No orders of the defined types were placed in this encounter.      Electronically Signed by Dutch Quint, MD      Date: 02/10/2021    Patient Name: Dillon Wood    Medicare AWV Note:    Patient was seen in their home (POS 12) in lieu of an office visit for the following reason:   Requires use of  an assistive device in order to ambulate due to poor balance.    LEGAL DOCUMENTS and CODE STATUS:   Advance Directive Received: No     Type of Document Received:   Needs [place] forms        Code Status: FULL    Health Risk Assessment:   1)  What is your living situation?  Independent at home    2)  Do you have help available to you for activities of daily living ? no, I do not need help    3)  Do you have  help available to you for medication administration?  yes, family provides assistance    4)  Can you handle your money without help? yes    5)  Do you have trouble paying for your medications?  no     6)  Do you worry about falling? yes    7)  Have you had any falls in the past year? yes, no injury    8)  Are you using any of the following to avoid falls?  cane and walker    9)  Mobility Rating:  ambulating with no exercise    10)  Home Safety Evaluation:   currently living in hotel    11)  DME in home:  walker and cane    12)  Are you feeling down, depressed or hopeless? no     13)  Are you finding joy in anything? yes     14)  Has your weight changed in the past 6 months? no change in weight      Additional Concerns    Patient Care Team:  Patsy Lager, MD as PCP - General    Past Medical History:   Diagnosis Date    Facial contusion, initial encounter 04/03/2017    Gastroesophageal reflux disease     Hyperlipemia      Past Surgical History:   Procedure Laterality Date    ABDOMINAL SURGERY      Hernia repair     Allergies   Allergen Reactions    Penicillins      Disorientation      Outpatient Medications Marked as Taking for the 02/10/21 encounter (Office Visit) with Dutch Quint, MD   Medication Sig Dispense Refill    apixaban (ELIQUIS) 5 MG Take 5 mg by mouth every 12 (twelve) hours      aspirin EC 81 MG EC tablet Take 81 mg by mouth daily      tamsulosin (FLOMAX) 0.4 MG Cap Take 0.4 mg by mouth Daily after dinner       Social History     Tobacco Use    Smoking status: Never     Passive exposure: Never     Smokeless tobacco: Never   Vaping Use    Vaping Use: Never used   Substance Use Topics    Alcohol use: Yes     Comment: 3 per week    Drug use: No      Family History   Problem Relation Age of Onset    Heart disease Father     Stroke Father     Diabetes Maternal Aunt          The following sections were reviewed this encounter by the provider:   Allergies           Hospitalizations:   hospitalization within past 6 months - discharge diagnosis Bladder stone.  PVD    Depression Screening:   No data recorded     Functional Assessment:   Falls Risk:  timed "Get Up and Go Evaluation" < 20 seconds  Hearing:  hearing significantly decreased  Exercise:  no exercise  ADL's:   Bathing - independent   Dressing - independent   Mobility - independent   Transfer - independent   Eating - independent}   Toileting - independent   ADL assistance not needed    Assessment:   BP 108/60   Pulse 62   Ht 1.791 m (5' 10.5") Comment: pt report  Wt 72.6 kg (160 lb)  Comment: pt report  SpO2 98%   BMI 22.63 kg/m      Cognitive Function:   Mood/affect: Appropriate  Appearance: neatly groomed, appropriately and adequately nourished  Family member/caregiver input:  needs referrals to specialists        AWV Mini-Cog Result:  > 3 points - negative screen for dementia      Assessment/Plan:   1. Squamous cell carcinoma of head and neck    2. Frequent falls    3. Stenosis of carotid artery, unspecified laterality    4. Bladder stone    5. Memory loss    6. PVD (peripheral vascular disease)    7. Neuropathy    8. Acute deep vein thrombosis (DVT) of femoral vein of left lower extremity    9. Traumatic subarachnoid hemorrhage without loss of consciousness, initial encounter    10. Advanced directives, counseling/discussion        Dutch Quint, MD  02/10/2021

## 2021-02-10 NOTE — Assessment & Plan Note (Signed)
A/P: Patient tearful at discussion of CODE status and relates a TV show where a doctor told patients to have everything done.  Patient wants FULL CODE

## 2021-02-10 NOTE — Assessment & Plan Note (Addendum)
A/P: Did not see reason for DVT and patient on eliquis.  Will continue

## 2021-02-10 NOTE — Assessment & Plan Note (Signed)
A/P: Family describes Carotid artery disease but do not see evidence of that in Care Everywhere.  Patient is to see Vascular surgery

## 2021-02-10 NOTE — Assessment & Plan Note (Addendum)
A/P: Needs to have follow up with DERM ONC.  Will order and request  consult

## 2021-02-10 NOTE — Patient Instructions (Addendum)
Preventive Wellness Plan  Today's Date: February 10, 2021    Patient Name:Dillon Wood    Date of Birth: 12-09-1928     As part of your wellness benefit, Medicare makes many screening tests available to you at no charge.  A complete list of these tests can be found at their website, [employer]es. However, many of these tests or recommendations are out of date, or may not apply to you. After careful consideration of your own personal health needs, the following testing is recommended for you:      Preventive Service    Up-to-date (UTD)/Due/Not Applicable (N/A)   Last Done   Medicare Frequency   Body Mass Index   Up-to-date February 10, 2021  (BMI):Body mass index is 22.63 kg/m.   Height:Height: 179.1 cm (5' 10.5") (pt report)  Weight:Weight: 72.6 kg (160 lb) (pt report) Annually   Blood Pressure: Up-to-date February 10, 2021    BP: 108/60   Every 2 yrs, if BP </= 120/80 mm hg  Annually, if BP >120-139/80-89 mm hg   Cholesterol Testing Not applicable No results found for: LDL  Regularly beginning at age 89 with risk factors   Diabetes Screening Not applicable Lab Results   Component Value Date    GLU 96 04/02/2017      If prediabetes, one screening every 6 months  Otherwise, one screening every 12 months with certain risk factors for diabetes   Osteoporosis Screening   (Bone Density Measurement)  Not applicable  Routinely, for women aged 65+   Depression Screening Up-to-date February 10, 2021  As necessary for those with risk factors   Alcohol Misuse Screening Not applicable  As necessary for those with risk factors   Immunizations:   Nursing to check registry   There is no immunization history on file for this patient. Prevnar 13: 1 dose after age 46  Pneumovax 34: 1 dose 1 year after Prevnar  Influenza: Annually   Advance Directive Needs new copy Advanced Directive form  Once; update as needed   Medical Nutrition Therapy Not applicable  As necessary for  diabetes or renal disease   Smoking Cessation Counseling Not applicable Counseling given: Not Answered   Frequency: two cessation attempts per year.           Your major risk factors:       falls     Recommendations for improvement:    Continue walking as exercise now that PT/OT done     Referrals:    See After Visit Summary orders

## 2021-02-10 NOTE — Assessment & Plan Note (Signed)
A/P: Needs urology referal

## 2021-02-10 NOTE — Assessment & Plan Note (Addendum)
A/P: Wound healed but needs follow up with Vascular surgery.  Family states he did have a procedure but they are not clear what.

## 2021-02-10 NOTE — Assessment & Plan Note (Signed)
A/P: 2018.  Has recovered.  Mild short term memory loss but did well on testing

## 2021-02-10 NOTE — Assessment & Plan Note (Signed)
A/P: Just finished PT/OT and has been doing well.  Will continue to use Cane and walker as needed

## 2021-02-10 NOTE — Assessment & Plan Note (Signed)
A/P: According to daughter. Patient denies any symptoms currently and will monitor clinically

## 2021-02-13 ENCOUNTER — Telehealth (INDEPENDENT_AMBULATORY_CARE_PROVIDER_SITE_OTHER): Payer: Self-pay

## 2021-02-13 NOTE — Telephone Encounter (Signed)
Called pt's Daughter Dillon Wood and left VM for her to call back to see if she is going to attend appts and if she needs the numbers and can schedule or needs assistance.      Dillon Wood called back and wanted the list of Dr appts needed to be emailed to her.  Emailed the list to [email or web address].  Also Emailed blank Advance Directive to her.

## 2021-02-13 NOTE — Telephone Encounter (Signed)
-----   Message from Dutch Quint, MD sent at 02/10/2021  4:16 PM EDT -----  Patient needs appointment with [facility] [phone]), Vascular surgery ([phone]) and [facility] [phone]). Please request appointments.  Please send Advanced Directive blank to daughter

## 2021-02-16 ENCOUNTER — Encounter (INDEPENDENT_AMBULATORY_CARE_PROVIDER_SITE_OTHER): Payer: Self-pay

## 2021-02-16 NOTE — Progress Notes (Signed)
*  THIS FORM IS FOR INTERNAL USE FOR VASCULAR SURGERY AND IS NOT A PROGRESS NOTE*          Appt Date/Time: 02/21/21 8:20 am    87 y.o. male   PCP: Hilma Favors, PA  Referring Physician:   Merleen Milliner      Appointment Type: New Patient  Imaging: Outside Imaging pt bring records    Chief Complaint/HPI: Consult PVD  Date of Last Visit:  N/A    Allergies:   Allergies   Allergen Reactions    Penicillins      Disorientation         Selected Medications:  Aspirin and Eliquis    Focused Prior Medical History: HLD and DVT             Smoker           Former Smoker      x    Never Smoker      Vital Signs this Visit Pulses  HT  BP R BP L Temp   Rad Ulnar Brach Fem Pop DP PT        R          WT  Pulse R Pulse L SpO2       L          Imaging results:        Plan of Care:

## 2021-02-17 ENCOUNTER — Telehealth (INDEPENDENT_AMBULATORY_CARE_PROVIDER_SITE_OTHER): Payer: Self-pay | Admitting: Internal Medicine

## 2021-02-17 ENCOUNTER — Encounter (INDEPENDENT_AMBULATORY_CARE_PROVIDER_SITE_OTHER): Payer: Self-pay | Admitting: Physician Assistant

## 2021-02-17 DIAGNOSIS — Z111 Encounter for screening for respiratory tuberculosis: Secondary | ICD-10-CM

## 2021-02-17 NOTE — Telephone Encounter (Signed)
Spoke to daughter [name] who stated has mailed pt's med list to our RN [name] Whyte.  Documentation received,this LPN scanned documentation into pt's chart but could not understand clearly which meds patient is taking currently,please review and advise how to proceed.

## 2021-02-17 NOTE — Telephone Encounter (Signed)
Patient needs appointment with [facility] [phone]),  and [facility] [phone]). Please request appointments from specialists and ask specialists to contact daughter [name] to schedule.      Also, Please ask [name] to send Medication list

## 2021-02-20 NOTE — Telephone Encounter (Signed)
Med list reviewed and updated 

## 2021-02-21 ENCOUNTER — Ambulatory Visit (HOSPITAL_BASED_OUTPATIENT_CLINIC_OR_DEPARTMENT_OTHER): Payer: Medicare Other | Admitting: Specialist

## 2021-02-21 NOTE — Telephone Encounter (Signed)
Called [facility] at [phone] spoke to Pagie,stated pt does not need a referral from [facility] faxed over information found in Care Everywhere,Dermatology Clinic office notes nad Pathology report done in 09/29/2020 and office notes from [facility] pt had an appt on 12/21/2020.    LPN called [facility]   [address]  [facility]  [place], [place] [address]  Ph: [phone]  Fax: [phone]  Receptionist stated patient needs a referral with Dx and reason of visit ,LPN informed receptionist that pt's records can be found in Epic,she stated it is easier if referral placed with the reason of the visit, informed that number will be provided to pt's daughter to coordinate an appt.    please advise/place an order.    LPN called [facility]   Ph: [phone]  Fax :[phone]   Spoke to Maci,informed patient needs a consultation with a Urologist per PCP recommendations ,daughter will call their office to coordinate an appt , Neta Mends voiced understanding and requested any supportive documentation ,pt's AVS from PCP visit on 8/19 ,Pre-Op office visit notes from [facility] on 12/07/2020,and office visit from 09/26/2020 with the Urology Clinic sent.    LPN called pt's daughter [name] ,relayed all of the specialist  information above,[name] appreciated the info and has no questions at the moment.

## 2021-02-22 ENCOUNTER — Encounter (INDEPENDENT_AMBULATORY_CARE_PROVIDER_SITE_OTHER): Payer: Self-pay | Admitting: Specialist

## 2021-02-22 ENCOUNTER — Telehealth: Payer: Self-pay

## 2021-02-22 ENCOUNTER — Encounter (INDEPENDENT_AMBULATORY_CARE_PROVIDER_SITE_OTHER): Payer: Self-pay | Admitting: Internal Medicine

## 2021-02-22 NOTE — Telephone Encounter (Signed)
Move In [employer] to patient's daughter Dillon Wood. Awaiting response concerning xray timing.

## 2021-02-23 NOTE — Telephone Encounter (Signed)
[name] emailed this RN stating to hold off on xrays until move in date is set.

## 2021-03-06 NOTE — Telephone Encounter (Signed)
Dtr [name] called re move in process items -    requests orders for CXR (tb screening for admission) and PCR test which she will get via drive through unless calls back for in home mobile phleb.  To obtain test.

## 2021-03-06 NOTE — Addendum Note (Signed)
Addended by: Terrance Mass on: 03/06/2021 05:00 PM     Modules accepted: Orders

## 2021-03-07 ENCOUNTER — Other Ambulatory Visit: Payer: Self-pay | Admitting: Physician Assistant

## 2021-03-07 NOTE — Telephone Encounter (Signed)
Called pt's daughter [name] to inquire if agrees with mobile or outpatient radiology,[name] stated will take patient to outpatient [facility] ,daughter asked this LPN to e-mail order to her e-mail,informed daughter that this LPN will e-mail order to her and will fax order to [facility] .[name] appreciated the call and has no questions at the moment.

## 2021-03-09 ENCOUNTER — Other Ambulatory Visit (INDEPENDENT_AMBULATORY_CARE_PROVIDER_SITE_OTHER): Payer: Self-pay | Admitting: Physician Assistant

## 2021-03-09 NOTE — Telephone Encounter (Signed)
Received fax from [facility] in [place], [address] requesting refill on aspirin 81 mg tab.  LPN called daughter to see what pharmacy the pt uses.  Daughter to call [facility] when she is out of an appt.

## 2021-03-14 ENCOUNTER — Ambulatory Visit: Payer: Medicare Other | Admitting: Hematology & Oncology

## 2021-03-16 ENCOUNTER — Encounter (INDEPENDENT_AMBULATORY_CARE_PROVIDER_SITE_OTHER): Payer: Self-pay

## 2021-03-16 NOTE — Progress Notes (Signed)
Appt Date/Time: 03/21/21 @  3:40 pm      87 y.o. male   PCP: Hilma Favors, PA  Referring Physician:         Appointment Type: New Patient  Imaging:  Care everywhere ( [facility] 10/23/20, L venous dop 10/23/20, L foot Xray 10/23/20)    Chief Complaint/HPI: Consult left foot wound w/throbbing pain. PAD     Date of Last Visit: n/a   Allergies:   Allergies   Allergen Reactions    Penicillins      Disorientation         Selected Medications:  Aspirin and Eliquis    Focused Prior Medical History:  n/a             Smoker           Former Smoker    x      Never Smoker      Vital Signs this Visit Pulses  HT  BP R BP L Temp   Rad Ulnar Brach Fem Pop DP PT        R          WT  Pulse R Pulse L SpO2       L          Imaging results:        Plan of Care:

## 2021-03-21 ENCOUNTER — Ambulatory Visit (INDEPENDENT_AMBULATORY_CARE_PROVIDER_SITE_OTHER): Payer: Medicare Other | Admitting: Specialist

## 2021-03-21 ENCOUNTER — Encounter (INDEPENDENT_AMBULATORY_CARE_PROVIDER_SITE_OTHER): Payer: Self-pay | Admitting: Specialist

## 2021-03-21 VITALS — BP 102/65 | HR 76 | Temp 98.8°F

## 2021-03-21 DIAGNOSIS — I6523 Occlusion and stenosis of bilateral carotid arteries: Secondary | ICD-10-CM

## 2021-03-21 DIAGNOSIS — L97529 Non-pressure chronic ulcer of other part of left foot with unspecified severity: Secondary | ICD-10-CM

## 2021-03-21 DIAGNOSIS — I739 Peripheral vascular disease, unspecified: Secondary | ICD-10-CM

## 2021-03-21 MED ORDER — ATORVASTATIN CALCIUM 40 MG PO TABS
40.0000 mg | ORAL_TABLET | Freq: Every day | ORAL | 1 refills | Status: DC
Start: 2021-03-21 — End: 2021-04-21

## 2021-03-21 NOTE — Progress Notes (Signed)
OFFICE VISIT  Vascular and Endovascular Surgery      Date: March 21, 2021  Consulting Physician: Romelle Starcher, MD   Consulting Service: Vascular and Endovascular Surgery    Chief Complaint   Patient presents with    Consult (Initial)      Consult left foot wound w/throbbing pain. PAD        History of Present Illness     Dillon Wood is a 87 y.o. male who presents as a new patient for vascular surgery consultation. Patient has a history of PAD and is s/p L SFA stent placement on 10/31/2020 by Dr. Deno Lunger at [facility] [place]. Procedure performed for left foot rest pain and non-healing wound of the left foot plantar surface.     ABI performed in May 2022 prior to intervention demonstrated R 0.79(0.41) and L 0.47 (0.21).  Venous duplex performed on 10/23/2020 demonstrated small focal mural filling defect in the distal left femoral vein is compatible with small nonocclusive DVT, possibly chronic.     Today, patient presents to the office with his son via telephone, grandson, wife.  Patient reports having a left plantar surface wound x1 year which was cared for by the wound center in [facility].  Patient reports that he received a cement cast and was told the wound was healing properly.  He has since relocated to the [place] area and has not established care with a local wound care center.  The wound is open but does not appear infected.  Patient denies any ischemic claudication or rest pain.     While under the care of the vascular surgeon in [place], patient was also told he had carotid artery stenosis and was in need of a stent to the right carotid artery.  No imaging available for review of the patient's carotid artery.  Patient denies any current symptoms of lateralizing neurologic ischemia or any history of TIA, CVA or amaurosis fugax.    Medically managed on aspirin and Eliquis (for DVT).  Patient was on statin in the past but is not currently on any statin therapy.    Past medical  history includes pulmonary fibrosis, skin cancer s/p raditation therapy to the head and neck, and nephrolithiasis status post lithotripsy.     Past Medical History     Past Medical History:   Diagnosis Date    Closed fracture of left side of maxilla 04/03/2017    Facial contusion, initial encounter 04/03/2017    Gastroesophageal reflux disease     Hyperlipemia     Left orbit fracture, closed, initial encounter 04/03/2017       Past Surgical History:   Procedure Laterality Date    ABDOMINAL SURGERY      Hernia repair       Allergies     Allergies   Allergen Reactions    Penicillins      Disorientation       Medications     Current Outpatient Medications:     apixaban (ELIQUIS) 5 MG, Take 5 mg by mouth every 12 (twelve) hours, Disp: , Rfl:     aspirin EC 81 MG EC tablet, Take 81 mg by mouth daily, Disp: , Rfl:     tamsulosin (FLOMAX) 0.4 MG Cap, Take 0.4 mg by mouth Daily after dinner, Disp: , Rfl:     Family History   Problem Relation Age of Onset    Heart disease Father     Stroke Father  Diabetes Maternal Aunt        Social History     Socioeconomic History    Marital status: Married    Number of children: 4   Occupational History    Occupation: Surveyor, [employer] of [employer]   Tobacco Use    Smoking status: Never     Passive exposure: Never    Smokeless tobacco: Never   Vaping Use    Vaping Use: Never used   Substance and Sexual Activity    Alcohol use: Yes     Comment: 3 per week    Drug use: No       Review of Systems   Constitutional: Denies weight changes, fever, chills, weakness or night sweats.   Neurologic:  Denies headaches, dizziness, fainting, numbness, tingling, seizure, stroke or tremors.    HEENT:  Denies visual changes, hearing loss, difficulty swallowing, snoring or hoarseness.  Respiratory: Denies cough, wheezing, SOB or hemoptysis.  Cardiovascular: Denies chest pain, cardiac murmur, irregular heartbeat, dyspnea, lower extremity swelling, lower extremity claudication.    Gastrointestinal:Denies  abdominal pain, nausea, vomiting and diarrhea  Genitourinary: Denies for dysuria or hematuria.   Hematologic/Lymphatic:  Denies lymphadenopathy, bleeding disorders, easy bruising or anemia.   Endocrine:  Denies diabetes, thyroid disease, polydipsia, polyuria, heat or cold intolerance.    Musculoskeletal:  Denies joint pain, back pain, muscle pain or muscle weakness.   Skin: left foot plantar surface wound.    Psychiatric:  Denies depression, anxiety or panic attacks.    All other systems were reviewed and are negative except what is stated in the HPI       Physical Exam     Vitals:    03/21/21 1608   BP: 102/65   BP Site: Right arm   Patient Position: Sitting   Cuff Size: Medium   Pulse: 76   Temp: 98.8 F (37.1 C)   TempSrc: Temporal   SpO2: 95%       There is no height or weight on file to calculate BMI.    General:  Patient appears their stated age, well-nourished.  Alert and in no apparent distress.  HEENT:  Normocephalic.   Neck:  No JVD  Lungs: Respiratory effort unlabored, chest expansion symmetric.  Cardiac: RRR,  no rubs, gallops or murmurs.    Abd: Soft, nondistended, nontender.   Extremities: FROM, symmetric. No ulcers, gangrene or non-healing wound.    Pulses: Right Femoral pulses 2+, Left Femoral 2+, small ulcer over the head of left first metatarsal which according the patient is healing  Skin: Color appropriate for race, Skin warm, dry, no gangrene, no non healing ulcers, no varicose veins , no hyperpigmentation, no lipo-dermatosclerosis  Neuro: Good insight and judgment, oriented to person, place, and time CN II-XII intact, gross motor and sensory intact     Labs     CBC:   WBC   Date/Time Value Ref Range Status   04/02/2017 06:10 PM 7.67 3.50 - 10.80 x10 3/uL Final     RBC   Date/Time Value Ref Range Status   04/02/2017 06:10 PM 4.07 (L) 4.70 - 6.00 x10 6/uL Final     Hgb   Date/Time Value Ref Range Status   04/02/2017 06:10 PM 12.8 (L) 13.0 - 17.0 g/dL Final     Hematocrit   Date/Time Value Ref  Range Status   04/02/2017 06:10 PM 37.0 (L) 42.0 - 52.0 % Final     MCV   Date/Time Value Ref Range Status   04/02/2017 06:10  PM 90.9 80.0 - 100.0 fL Final     MCHC   Date/Time Value Ref Range Status   04/02/2017 06:10 PM 34.6 32.0 - 36.0 g/dL Final     RDW   Date/Time Value Ref Range Status   04/02/2017 06:10 PM 14 12 - 15 % Final     Platelets   Date/Time Value Ref Range Status   04/02/2017 06:10 PM 136 (L) 140 - 400 x10 3/uL Final       CMP:   Sodium   Date/Time Value Ref Range Status   04/02/2017 06:10 PM 140 136 - 145 mEq/L Final     Potassium   Date/Time Value Ref Range Status   04/02/2017 06:10 PM 3.5 3.5 - 5.1 mEq/L Final     Chloride   Date/Time Value Ref Range Status   04/02/2017 06:10 PM 108 100 - 111 mEq/L Final     CO2   Date/Time Value Ref Range Status   04/02/2017 06:10 PM 23 22 - 29 mEq/L Final     Glucose   Date/Time Value Ref Range Status   04/02/2017 06:10 PM 96 70 - 100 mg/dL Final     Comment:     ADA guidelines for diabetes mellitus:  Fasting:  Equal to or greater than 126 mg/dL  Random:   Equal to or greater than 200 mg/dL       BUN   Date/Time Value Ref Range Status   04/02/2017 06:10 PM 14 9 - 28 mg/dL Final     Anion Gap   Date/Time Value Ref Range Status   04/02/2017 06:10 PM 9.0 5.0 - 15.0 Final       Lipid Panel No results found for: CHOL, TRIG, HDL    Coags:   PT   Date/Time Value Ref Range Status   04/02/2017 06:10 PM 13.8 12.6 - 15.0 sec Final     PT INR   Date/Time Value Ref Range Status   04/02/2017 06:10 PM 1.1 0.9 - 1.1 Final     Comment:     Recommended Ranges for Protime INR:    2.0-3.0 for most medical and surgical thromboembolic states    2.5-3.5 for artificial heart valves  INR result may not represent exact Warfarin dosing level during  the transition period from Heparin to Warfarin therapy.  Recommend close clinical monitoring.       PTT   Date/Time Value Ref Range Status   04/02/2017 06:10 PM 29 23 - 37 sec Final         Diagnostic Imaging     I have reviewed and  interpreted the vascular diagnostic imaging with the patient    Assessment and Plan       1. PAD (peripheral artery disease)  US Arterial/ Graft Duplex Dopp Low Extrem Bil Comp    [place] Noninvas Low Extrem Art Dopp/Press/Wavefrms (Abi-Ppg) Ltd 1-2 Lvls      2. Carotid stenosis, asymptomatic, bilateral  US Carotid Duplex Dopp Comp Bilateral      3. Ulcer of left foot, unspecified ulcer stage  Ambulatory referral to Wound Clinic        Dillon Wood is a 87 y.o. male who presents as a new patient for vascular surgery consultation. Patient has a history of PAD and is s/p L SFA stent placement on 10/31/2020 by Dr. Deno Lunger at [facility] [place]. Procedure performed for left foot rest pain and non-healing wound of the left foot plantar surface.     ABI performed in  May 2022 prior to intervention demonstrated R 0.79(0.41) and L 0.47 (0.21).  Venous duplex performed on 10/23/2020 demonstrated small focal mural filling defect in the distal left femoral vein is compatible with small nonocclusive DVT, possibly chronic.     Today, patient presents to the office with his son via telephone, grandson, wife.  Patient reports having a left plantar surface wound x1 year which was cared for by the wound center in [facility].  Patient reports that he received a cement cast and was told the wound was healing properly.  He has since relocated to the [place] area and has not established care with a local wound care center.  The wound is open but does not appear infected.  Patient denies any ischemic claudication or rest pain.     My impression is that he has significant peripheral vascular disease bilaterally.  We will obtain an duplex ultrasound examination of the left lower extremity to evaluate the status of his previous angioplasty.  According to him he has had radiation to his head and neck and possibly has significant carotid artery disease therefore I will obtain a carotid duplex ultrasound examination.  I have also  recommended to him to be started on statin therapy therefore he was started on 40 mg of atorvastatin.  I will review his above studies after they are completed and this was discussed with the patient and family members and have agreed.    Judeth Cornfield I would like to thank you for allowing me to participate in the care of this patient.    Best regards    Romelle Starcher, MD, FACS, RPVI  [facility]  Chief, Section of Vascular Surgery  [place]  [facility]      This note was generated by the Epic EMR system/ Dragon speech recognition and may contain inherent errors or omissions not intended by the user. Grammatical errors, random word insertions, deletions, pronoun errors and incomplete sentences are occasional consequences of this technology due to software limitations. Not all errors are caught or corrected. If there are questions or concerns about the content of this note or information contained within the body of this dictation they should be addressed directly with the author for clarification

## 2021-03-23 ENCOUNTER — Telehealth (INDEPENDENT_AMBULATORY_CARE_PROVIDER_SITE_OTHER): Payer: Self-pay | Admitting: Internal Medicine

## 2021-03-23 ENCOUNTER — Encounter (INDEPENDENT_AMBULATORY_CARE_PROVIDER_SITE_OTHER): Payer: Self-pay | Admitting: Internal Medicine

## 2021-03-23 NOTE — Telephone Encounter (Signed)
Received email from pt daughter [name] regarding Assisted Living Physician Report required to meet the 30 day requirement for pt admission. Pt has requested the following from PCP:     Provide letter from Dr Marcelle Overlie stating that there has been no change in pt health with current date. (Letter placed in PCP folder for review and signature)    2. Provide an updated list of all meds, both over the counter and scripts with instructions on how to administer, including the creams.     **Reminders from [employer] GeneralWe discussed dropping mezcaline as a daily med and will use only as needed, rocasia cream to reflect only as needed, delete the previous memory med and add the new one that the neurologist just added and anything else you see necessary"    3. Provide script for PT/OT for Abad Manard [phone]

## 2021-03-27 ENCOUNTER — Telehealth (INDEPENDENT_AMBULATORY_CARE_PROVIDER_SITE_OTHER): Payer: Self-pay | Admitting: Physician Assistant

## 2021-03-27 ENCOUNTER — Encounter (INDEPENDENT_AMBULATORY_CARE_PROVIDER_SITE_OTHER): Payer: Self-pay | Admitting: Physician Assistant

## 2021-03-27 NOTE — Telephone Encounter (Signed)
Dtr declining visit for 10/4 due to schedule  conflict as well as patient is in the process of moving to [place]. She will contact the office once patient is settled.

## 2021-03-27 NOTE — Telephone Encounter (Signed)
Done

## 2021-03-28 ENCOUNTER — Encounter (INDEPENDENT_AMBULATORY_CARE_PROVIDER_SITE_OTHER): Payer: Medicare Other | Admitting: Physician Assistant

## 2021-03-29 ENCOUNTER — Other Ambulatory Visit (INDEPENDENT_AMBULATORY_CARE_PROVIDER_SITE_OTHER): Payer: Self-pay | Admitting: Physician Assistant

## 2021-03-29 MED ORDER — ASPIRIN 81 MG PO TBEC
81.0000 mg | DELAYED_RELEASE_TABLET | Freq: Every day | ORAL | 1 refills | Status: AC
Start: 2021-03-29 — End: ?

## 2021-03-29 NOTE — Telephone Encounter (Signed)
Called [name] and left VM asking her to call [facility] to [facility].  Waiting on heather to call [facility].

## 2021-03-29 NOTE — Telephone Encounter (Signed)
error 

## 2021-03-29 NOTE — Telephone Encounter (Signed)
Received call from [name] to update with preferred pharmacy     [facility] [phone]     Added pharmacy to refill request

## 2021-03-29 NOTE — Addendum Note (Signed)
Addended by: Iverson Alamin on: 03/29/2021 04:01 PM     Modules accepted: Orders

## 2021-03-29 NOTE — Addendum Note (Signed)
Addended by: Otilio Miu on: 03/29/2021 03:52 PM     Modules accepted: Orders

## 2021-03-31 ENCOUNTER — Encounter (INDEPENDENT_AMBULATORY_CARE_PROVIDER_SITE_OTHER): Payer: Medicare Other | Admitting: Physician Assistant

## 2021-04-04 ENCOUNTER — Encounter (INDEPENDENT_AMBULATORY_CARE_PROVIDER_SITE_OTHER): Payer: Self-pay

## 2021-04-04 NOTE — Progress Notes (Signed)
THIS FORM IS FOR INTERNAL USE FOR VASCULAR SURGERY AND IS NOT A PROGRESS NOTE      Room:         Appt Date/Time: 10/18, 1320 p    87 y.o. male   PCP: Hilma Favors, PA  Referring Physician:       Appointment Type: Follow-Up  Imaging: [place] Today, US Carotid, [place] Noninvas Low Extrem, US Arterial     Chief Complaint/HPI: 2 week F/U PAD, Carotid stenosis   Date of Last Visit:  03/21/21    Allergies:   Allergies   Allergen Reactions    Penicillins      Disorientation         Selected Medications:  Aspirin, Atorvastatin, and Eliquis    Focused Prior Medical History: HLD and Wounds             Smoker           Former Smoker      x    Never Smoker      Vital Signs this Visit Pulses  HT  BP R BP L Temp   Rad Ulnar Brach Fem Pop DP PT        R          WT  Pulse R Pulse L SpO2       L          Imaging results:        Plan of Care:

## 2021-04-05 ENCOUNTER — Telehealth (INDEPENDENT_AMBULATORY_CARE_PROVIDER_SITE_OTHER): Payer: Self-pay | Admitting: Physician Assistant

## 2021-04-05 DIAGNOSIS — L89892 Pressure ulcer of other site, stage 2: Secondary | ICD-10-CM

## 2021-04-05 DIAGNOSIS — I739 Peripheral vascular disease, unspecified: Secondary | ICD-10-CM

## 2021-04-05 NOTE — Telephone Encounter (Signed)
Situation  Caller name and facility/agency, contact number for call back: [facility] [phone]    Complaint: Wound on left foot, on the bottom on the ball of the foot    Onset (and if it has gotten better, worse or stayed the same): Identified yesterday    Had new tennis shoes in recent past, caused wound on foot (not sure if same exact location), wound healed, new shoes (CROCKS) may have caused this new wound? Unsure.    Patient has vascular issues in left leg.    Has appointment for vascular follow up on Tuesday, October 18.     Any treatment tried (e.g pain meds taken, insulin given): Need to know so the assisted living nurse can give care per doctors order, otherwise no help for wound can be provided and has been provided.    Current Medications/Treatment taken as ordered (see SnapShot)     apixaban (ELIQUIS) 5 MG  aspirin EC 81 MG EC tablet  atorvastatin (LIPITOR) 40 MG tablet  tamsulosin (FLOMAX) 0.4 MG Cap    Background:  Is patient receiving any other home services (such as home health):     Not at this time, wants PT and OT and now nursing.    In house company is [facility], but they do not have nursing      Vitals:   Not available    Assessment or Appearance:  The problem may be patient has recurring vascular wound on left foot, unsure.  Patient is ambulatory - it is not pressure therefore.      Assisted living can provide nursing wound care for patient and or skilled nursing can be ordered.  Patient has check up with Vascular specialist on Tuesday, October 18th.    Patient complains it hurts worse at night.    Daughter will send picture.      Request:    Does patient need PT and OT?    Does patient need skilled nursing visits?    Please write order for nurses in assisted living to provide wound care to foot and include frequency.

## 2021-04-06 ENCOUNTER — Encounter (INDEPENDENT_AMBULATORY_CARE_PROVIDER_SITE_OTHER): Payer: Self-pay | Admitting: Physician Assistant

## 2021-04-06 ENCOUNTER — Telehealth (INDEPENDENT_AMBULATORY_CARE_PROVIDER_SITE_OTHER): Payer: Self-pay | Admitting: Physician Assistant

## 2021-04-06 NOTE — Telephone Encounter (Signed)
ALF called and would like order faxed to:      [phone].     [facility] RN spoke with ALF Resident Care Director who is a nurse Dillon Wood who is waiting for order.  Asked for skilled nursing to be ordered for patient since it is a wound.  They can do daily care until skilled nursing can start.    [facility] RN or MD can choose home health, current inhouse company for [place] for PT and OT does not have nursing services.    [facility] RN spoke with patient's daughter Dillon Wood yesterday who also agreed to any company, had no preference.

## 2021-04-06 NOTE — Telephone Encounter (Signed)
Received fax from ALF requesting wound care order stating patient has a wound on the bottom of his left foot.   Please fax wound care order to ALF at [phone].     Please review and advise

## 2021-04-06 NOTE — Telephone Encounter (Signed)
Have entered order for skilled RN wound care.  Needs to have visit to see wound as has not been seen for awhile.  Can we do video visit tomorrow?  Please call daughter and ask if can be arranged

## 2021-04-06 NOTE — Telephone Encounter (Signed)
[facility] RN faxed wound care ambulatory order to DON at ALF [facility] and daughter to [facility].  Explained PT and OT should wait until advised from Wound Care or Vascular Surgeon if patient should participate. Provided [facility] contact information.  Patient has virtual visit with PA Virgel Bouquet tomorrow at noon and [facility] will connect with PA and patient using her technology.

## 2021-04-06 NOTE — Telephone Encounter (Signed)
See telephone encounter from yesterday.

## 2021-04-07 ENCOUNTER — Telehealth: Payer: Medicare Other | Admitting: Physician Assistant

## 2021-04-07 ENCOUNTER — Encounter (INDEPENDENT_AMBULATORY_CARE_PROVIDER_SITE_OTHER): Payer: Self-pay | Admitting: Physician Assistant

## 2021-04-07 ENCOUNTER — Telehealth (INDEPENDENT_AMBULATORY_CARE_PROVIDER_SITE_OTHER): Payer: Self-pay | Admitting: Physician Assistant

## 2021-04-07 DIAGNOSIS — S91302S Unspecified open wound, left foot, sequela: Secondary | ICD-10-CM

## 2021-04-07 DIAGNOSIS — S91332A Puncture wound without foreign body, left foot, initial encounter: Secondary | ICD-10-CM

## 2021-04-07 MED ORDER — FOAM DRESSING CIRCULAR BORDER EX PADS
1.0000 | MEDICATED_PAD | Freq: Every day | CUTANEOUS | 5 refills | Status: DC
Start: 2021-04-07 — End: 2022-05-15

## 2021-04-07 NOTE — Telephone Encounter (Signed)
-----   Message from Hilma Favors, [place] sent at 04/07/2021 12:20 PM EDT -----  Podiatry consult order placed.  Please fax and assist daughter with scheduling

## 2021-04-07 NOTE — Telephone Encounter (Signed)
Order emailed to Dr. Zettie Cooley, this RN will coordinate scheduling date for patient to be seen.

## 2021-04-07 NOTE — Assessment & Plan Note (Signed)
A/P: Patient scheduled to see vascular surgery Tuesday next week, wound clinic 10/21.  Order podiatry consult also.  Wound RN order placed yesterday.  Continue foam dressing[facility]  Called [facility] and gave verbal wound care orders. Will follow up at in person appt next Wednesday.

## 2021-04-07 NOTE — Progress Notes (Signed)
Date: 04/07/2021    Patient Name: Dillon Wood, Dillon Wood    Patient was seen in their home (POS 12) in lieu of an office visit for the following reason:   Requires use of an assistive device in order to ambulate due to poor balance.    LEGAL DOCUMENTS and CODE STATUS:   Advance Directive Received: No     Type of Document Received:   needs new forms as moved from [place]        Code Status: FULL    Chief Complaint   Patient presents with    foot wound        HPI:   History and ROS obtained from patient, wife, daughters on phone    Dillon Wood is a 87 y.o. year old male I am here to enroll into the medical house calls program.     Problem   Open Wound of Plantar Aspect of Foot, Left, Sequela    Patient with h/o vascular insufficiency s/p repair 11/2020 and had a similar left foot plantar MTP wound at that time.  A "cast" was placed and wound resolved.  Has recurred this week.  Patient walks on the front of his feet, "like he's tip-toeing" per daughter.  No discharge or erythema.  Daughter currently using foam gauze.  Patient wears Crocks and states they are the most comfortable shoes he's ever worn but they have a plastic footbed.          Originating site (Patient location): ALF  Distant site (Provider location): Home office  Mode of communication: Telemedicine with video  Provider: Hilma Favors, PA  Verbal consent has been obtained from patient to conduct a telemedicine visit encounter to minimize exposure to COVID-19: Yes  Language, if applicable and if translator was required: [place]  Approximate time spent in discussion: 30 minutes  Approximate time spent in total (discussion, follow up and documentation): 45 minutes           Past Medical History:   Diagnosis Date    Closed fracture of left side of maxilla 04/03/2017    Facial contusion, initial encounter 04/03/2017    Gastroesophageal reflux disease     Hyperlipemia     Left orbit fracture, closed, initial encounter 04/03/2017     Past Surgical History:    Procedure Laterality Date    ABDOMINAL SURGERY      Hernia repair     Family History   Problem Relation Age of Onset    Heart disease Father     Stroke Father     Diabetes Maternal Aunt      Social History     Tobacco Use    Smoking status: Never     Passive exposure: Never    Smokeless tobacco: Never   Vaping Use    Vaping Use: Never used   Substance Use Topics    Alcohol use: Yes     Comment: 3 per week    Drug use: No     Allergies   Allergen Reactions    Penicillins      Disorientation       MEDICATIONS:     Current Outpatient Medications:     apixaban (ELIQUIS) 5 MG, Take 5 mg by mouth every 12 (twelve) hours, Disp: , Rfl:     aspirin EC 81 MG EC tablet, Take 1 tablet (81 mg total) by mouth daily, Disp: 30 tablet, Rfl: 1    atorvastatin (LIPITOR) 40 MG tablet, Take 1 tablet (  40 mg total) by mouth daily, Disp: 30 tablet, Rfl: 1    tamsulosin (FLOMAX) 0.4 MG Cap, Take 0.4 mg by mouth Daily after dinner, Disp: , Rfl:     Wound Dressings (Foam Dressing Circular Border) Pads, Apply 1 each topically daily, Disp: 30 each, Rfl: 5    ROS:   Review of Systems   Constitutional:  Negative for chills and fever.   Respiratory:  Negative for cough, choking and shortness of breath.    Cardiovascular:  Negative for palpitations and leg swelling.   Gastrointestinal:  Negative for constipation and diarrhea.   Musculoskeletal:  Positive for gait problem (ambulates with cane).   Skin:  Positive for wound.   Neurological:  Negative for headaches.   Psychiatric/Behavioral:  Negative for behavioral problems.      All other systems pertinent to the chief complaint reviewed and otherwise negative     PHYSICAL EXAM:   There were no vitals taken for this visit.   Wt Readings from Last 1 Encounters:   02/10/21 72.6 kg (160 lb)      Ht Readings from Last 1 Encounters:   02/10/21 1.791 m (5' 10.5")     Physical Exam  Constitutional:       General: He is not in acute distress.     Appearance: He is not ill-appearing, toxic-appearing or  diaphoretic.      Comments: Conversant and clear   Eyes:      General: No scleral icterus.     Extraocular Movements: Extraocular movements intact.   Neck:      Vascular: No carotid bruit.   Cardiovascular:      Rate and Rhythm: Normal rate and regular rhythm.      Heart sounds: No murmur heard.  Pulmonary:      Effort: No respiratory distress.      Breath sounds: No wheezing, rhonchi or rales.   Abdominal:      General: There is no distension.      Tenderness: There is no abdominal tenderness. There is no guarding or rebound.   Musculoskeletal:      Cervical back: No rigidity or tenderness.      Right lower leg: No edema.      Left lower leg: No edema.      Comments: Cannot palpate L DP but foot warm and pink.  Scar at heel L from previous wound   Lymphadenopathy:      Cervical: No cervical adenopathy.   Skin:     Comments: Left plantar foot with 1.5cm stage 3 wound at 1st MTP joint, no surrounding erythema or edema.  Some calloused macerated skin surrounding   Neurological:      General: No focal deficit present.      Mental Status: He is oriented to person, place, and time.      Comments: 3/3 word recall   Psychiatric:         Mood and Affect: Mood normal.         Behavior: Behavior normal.     DIAGNOSTICS:     Lab Results   Component Value Date    WBC 7.67 04/02/2017    HGB 12.8 (L) 04/02/2017    HCT 37.0 (L) 04/02/2017    PLT 136 (L) 04/02/2017    NA 140 04/02/2017    K 3.5 04/02/2017    CL 108 04/02/2017    CREAT 1.2 04/02/2017    BUN 14 04/02/2017    CO2 23 04/02/2017  INR 1.1 04/02/2017    GLU 96 04/02/2017     No results found.    ASSESSMENT and PLAN:   Open wound of plantar aspect of foot, left, sequela  A/P: Patient scheduled to see vascular surgery Tuesday next week, wound clinic 10/21.  Order podiatry consult also.  Wound RN order placed yesterday.  Continue foam dressing, [facility]  Called ALF and gave verbal wound care orders. Will follow up at in person appt next Wednesday.    Medications  Ordered This Encounter         Disp Refills Start End    Wound Dressings (Foam Dressing Circular Border) Pads 30 each 5 04/07/2021     Apply 1 each topically daily - Apply externally          Orders Placed This Encounter   Procedures    Referral to Podiatry - EXTERNAL     Referral Priority:   Routine     Referral Type:   Consultation     Referral Reason:   Specialty Services Required     Requested Specialty:   Podiatric Surgery     Number of Visits Requested:   1         Electronically Signed by Hilma Favors, PA      Date: 04/07/2021    Patient Name: Dillon Wood, Dillon Wood    Medicare AWV Note:    Patient was seen in their home (POS 12) in lieu of an office visit for the following reason:   Requires use of an assistive device in order to ambulate due to poor balance.    LEGAL DOCUMENTS and CODE STATUS:   Advance Directive Received: No     Type of Document Received:   Needs [place] forms        Code Status: FULL    Health Risk Assessment:   1)  What is your living situation?  Independent at home    2)  Do you have help available to you for activities of daily living ? no, I do not need help    3)  Do you have help available to you for medication administration?  yes, family provides assistance    4)  Can you handle your money without help? yes    5)  Do you have trouble paying for your medications?  no     6)  Do you worry about falling? yes    7)  Have you had any falls in the past year? yes, no injury    8)  Are you using any of the following to avoid falls?  cane and walker    9)  Mobility Rating:  ambulating with no exercise    10)  Home Safety Evaluation:   currently living in hotel    11)  DME in home:  walker and cane    12)  Are you feeling down, depressed or hopeless? no     13)  Are you finding joy in anything? yes     14)  Has your weight changed in the past 6 months? no change in weight      Additional Concerns    Patient Care Team:  Hilma Favors, PA as PCP - General (Physician Assistant)  Deberah Castle, MSW as Social Worker  Carmine Savoy., RN as Registered Nurse    Past Medical History:   Diagnosis Date    Closed fracture of left side of maxilla 04/03/2017  Facial contusion, initial encounter 04/03/2017    Gastroesophageal reflux disease     Hyperlipemia     Left orbit fracture, closed, initial encounter 04/03/2017     Past Surgical History:   Procedure Laterality Date    ABDOMINAL SURGERY      Hernia repair     Allergies   Allergen Reactions    Penicillins      Disorientation      Outpatient Medications Marked as Taking for the 04/07/21 encounter (Telemedicine Visit) with Hilma Favors, PA   Medication Sig Dispense Refill    apixaban (ELIQUIS) 5 MG Take 5 mg by mouth every 12 (twelve) hours      aspirin EC 81 MG EC tablet Take 1 tablet (81 mg total) by mouth daily 30 tablet 1    atorvastatin (LIPITOR) 40 MG tablet Take 1 tablet (40 mg total) by mouth daily 30 tablet 1    tamsulosin (FLOMAX) 0.4 MG Cap Take 0.4 mg by mouth Daily after dinner      Wound Dressings (Foam Dressing Circular Border) Pads Apply 1 each topically daily 30 each 5     Social History     Tobacco Use    Smoking status: Never     Passive exposure: Never    Smokeless tobacco: Never   Vaping Use    Vaping Use: Never used   Substance Use Topics    Alcohol use: Yes     Comment: 3 per week    Drug use: No      Family History   Problem Relation Age of Onset    Heart disease Father     Stroke Father     Diabetes Maternal Aunt          The following sections were reviewed this encounter by the provider:   Tobacco   Allergies   Meds   Problems   Med Hx   Surg Hx   Fam Hx            Hospitalizations:   hospitalization within past 6 months - discharge diagnosis Bladder stone.  PVD    Depression Screening:   No data recorded     Functional Assessment:   Falls Risk:  timed "Get Up and Go Evaluation" < 20 seconds  Hearing:  hearing significantly decreased  Exercise:  no exercise  ADL's:   Bathing - independent   Dressing -  independent   Mobility - independent   Transfer - independent   Eating - independent}   Toileting - independent   ADL assistance not needed    Assessment:   There were no vitals taken for this visit.     Cognitive Function:   Mood/affect: Appropriate  Appearance: neatly groomed, appropriately and adequately nourished  Family member/caregiver input:  needs referrals to specialists        AWV Mini-Cog Result:  > 3 points - negative screen for dementia      Assessment/Plan:   1. Penetrating foot wound, left, initial encounter  - Referral to Podiatry - EXTERNAL  - Wound Dressings (Foam Dressing Circular Border) Pads; Apply 1 each topically daily  Dispense: 30 each; Refill: 5    2. Open wound of plantar aspect of foot, left, sequela        Hilma Favors, PA  04/07/2021

## 2021-04-07 NOTE — Telephone Encounter (Signed)
Noted.  Patient also needs to see Dermatology Oncology, [facility].   Can we help them get an appointment?  Phone is [phone]

## 2021-04-10 NOTE — Telephone Encounter (Signed)
Dr. Zettie Cooley said that he cannot see patient until 05/17/2021. This RN emailed patient's daughter Ms. [name] to see if she has a preference of which podiatrist provider that patient will see. Awaiting reply from daughter.

## 2021-04-11 ENCOUNTER — Other Ambulatory Visit (INDEPENDENT_AMBULATORY_CARE_PROVIDER_SITE_OTHER): Payer: Medicare Other

## 2021-04-11 ENCOUNTER — Encounter (INDEPENDENT_AMBULATORY_CARE_PROVIDER_SITE_OTHER): Payer: Self-pay | Admitting: Specialist

## 2021-04-11 ENCOUNTER — Ambulatory Visit (INDEPENDENT_AMBULATORY_CARE_PROVIDER_SITE_OTHER): Payer: Medicare Other | Admitting: Specialist

## 2021-04-12 ENCOUNTER — Encounter (INDEPENDENT_AMBULATORY_CARE_PROVIDER_SITE_OTHER): Payer: Medicare Other | Admitting: Physician Assistant

## 2021-04-12 ENCOUNTER — Telehealth (INDEPENDENT_AMBULATORY_CARE_PROVIDER_SITE_OTHER): Payer: Self-pay | Admitting: Physician Assistant

## 2021-04-12 ENCOUNTER — Ambulatory Visit: Payer: Medicare Other | Attending: Foot & Ankle Surgery

## 2021-04-12 DIAGNOSIS — S91302S Unspecified open wound, left foot, sequela: Secondary | ICD-10-CM

## 2021-04-12 DIAGNOSIS — G603 Idiopathic progressive neuropathy: Secondary | ICD-10-CM | POA: Insufficient documentation

## 2021-04-12 DIAGNOSIS — L97422 Non-pressure chronic ulcer of left heel and midfoot with fat layer exposed: Secondary | ICD-10-CM | POA: Insufficient documentation

## 2021-04-12 DIAGNOSIS — M216X2 Other acquired deformities of left foot: Secondary | ICD-10-CM | POA: Insufficient documentation

## 2021-04-12 NOTE — Telephone Encounter (Signed)
Spoke with Albin Felling at [facility], she said that patient is already scheduled to see Dr. Revonda Humphrey in March 2023. This RN asked Albin Felling if that was the earliest appointment, she said yes if patient is already scheduled that is the earliest appointment.

## 2021-04-12 NOTE — Telephone Encounter (Signed)
Patient's daughter [name] is agreeable to Dr. Zettie Cooley seeing patient in November. This RN emailed Dr. Zettie Cooley to inquire if he still has that appointment available. Awaiting reply.

## 2021-04-12 NOTE — Telephone Encounter (Signed)
Daughter Dillon Wood left message on [facility]  voice mail requesting to cancel todays appt, as pt was able to secure appt at a wound care clinic appointment for today and they have to cancel Stephanie[facility].

## 2021-04-13 NOTE — Telephone Encounter (Signed)
Dr. Zettie Cooley emailed this RN back and said that he still has appointment on 05/17/2021 open with an arrival time between 12:15 PM and 12:45 PM. This RN sent an email to patient's daughter to inform her of appointment details.

## 2021-04-19 ENCOUNTER — Ambulatory Visit
Admission: RE | Admit: 2021-04-19 | Discharge: 2021-04-19 | Disposition: A | Discharge status: Home / Self Care | Payer: Medicare Other | Source: Ambulatory Visit | Attending: Foot & Ankle Surgery | Admitting: Foot & Ankle Surgery

## 2021-04-19 ENCOUNTER — Ambulatory Visit: Payer: Self-pay

## 2021-04-19 ENCOUNTER — Ambulatory Visit: Payer: Medicare Other

## 2021-04-19 DIAGNOSIS — S91302S Unspecified open wound, left foot, sequela: Secondary | ICD-10-CM

## 2021-04-19 DIAGNOSIS — L97422 Non-pressure chronic ulcer of left heel and midfoot with fat layer exposed: Secondary | ICD-10-CM | POA: Insufficient documentation

## 2021-04-20 ENCOUNTER — Ambulatory Visit: Payer: Self-pay

## 2021-04-21 ENCOUNTER — Telehealth (INDEPENDENT_AMBULATORY_CARE_PROVIDER_SITE_OTHER): Payer: Self-pay | Admitting: Physician Assistant

## 2021-04-21 ENCOUNTER — Encounter (INDEPENDENT_AMBULATORY_CARE_PROVIDER_SITE_OTHER): Payer: Self-pay

## 2021-04-21 MED ORDER — ATORVASTATIN CALCIUM 40 MG PO TABS
40.0000 mg | ORAL_TABLET | Freq: Every day | ORAL | 3 refills | Status: AC
Start: 2021-04-21 — End: ?

## 2021-04-21 NOTE — Telephone Encounter (Signed)
Refill request received for RX  atorvastatin (LIPITOR) 40 MG tablet; Take 1 tablet (40 mg total) by mouth daily    This lpn called pt dtr [name] no answer LVM to advs refill request submitted and educated next visit scheduled 11/9

## 2021-04-21 NOTE — Telephone Encounter (Signed)
Patients daughter calls and leaves message on Writers phone stating she got a call from the RN at patients ALF that patient needs a refill of atorvastatin (LIPITOR) 40 MG tablet.  Daughter states she was not aware patient was taking this medication but if he is he does need a refill. Daughter also states she would like an in person appointment schedule for [facility] Provider to see patient for follow up on his wound.     Patient scheduled with Kizzie Bane, PA for 11/9.    [id number] [place], [place] - [address]

## 2021-04-21 NOTE — Progress Notes (Signed)
THIS FORM IS FOR INTERNAL USE FOR VASCULAR SURGERY AND IS NOT A PROGRESS NOTE      Room:                                                          Appt Date/Time: 04/25/2021 @ 1:10pm     87 y.o. male   PCP: Hilma Favors, PA  Referring Physician:   N/A      Appointment Type: Follow-Up - Telephone visit on 03/21/2021    Imaging:  [place] Today -   US Arterial Graft duplex and [place] ABI at 10:30am   US Carotid at 11:30am    Chief Complaint/HPI: F/U on left plantar surface wound x1 year (treated in [facility])   F/U on PVD bilaterally and examine status of previous angioplasty   F/U on carotid artery disease    Date of Last Visit:  03/21/2021    Allergies:   Allergies   Allergen Reactions    Penicillins      Disorientation         Selected Medications:  Aspirin, Atorvastatin, and Eliquis    Focused Prior Medical History: HLD, Wounds, Cancer, and carotid artery stenosis, PVD, PAD             Smoker           Former Smoker      X    Never Smoker      Vital Signs this Visit Pulses  HT  BP R BP L Temp   Rad Ulnar Brach Fem Pop DP PT        R          WT  Pulse R Pulse L SpO2       L          Imaging results:        Plan of Care:

## 2021-04-25 ENCOUNTER — Ambulatory Visit (INDEPENDENT_AMBULATORY_CARE_PROVIDER_SITE_OTHER): Payer: Medicare Other | Admitting: Specialist

## 2021-04-25 ENCOUNTER — Ambulatory Visit (INDEPENDENT_AMBULATORY_CARE_PROVIDER_SITE_OTHER): Payer: Medicare Other

## 2021-04-25 ENCOUNTER — Encounter (INDEPENDENT_AMBULATORY_CARE_PROVIDER_SITE_OTHER): Payer: Self-pay | Admitting: Specialist

## 2021-04-25 VITALS — BP 98/63 | HR 68 | Temp 98.1°F | Ht 70.0 in | Wt 160.0 lb

## 2021-04-25 DIAGNOSIS — I739 Peripheral vascular disease, unspecified: Secondary | ICD-10-CM

## 2021-04-25 DIAGNOSIS — I6523 Occlusion and stenosis of bilateral carotid arteries: Secondary | ICD-10-CM

## 2021-04-25 DIAGNOSIS — Z923 Personal history of irradiation: Secondary | ICD-10-CM

## 2021-04-25 DIAGNOSIS — I6529 Occlusion and stenosis of unspecified carotid artery: Secondary | ICD-10-CM

## 2021-04-25 NOTE — Progress Notes (Signed)
OFFICE VISIT  Vascular and Endovascular Surgery    Date: April 25, 2021  Consulting Physician: Romelle Starcher, MD   Consulting Service: Vascular and Endovascular Surgery    Chief Complaint   Patient presents with    Follow-up      F/U on left plantar surface wound x1 year (treated in [facility])   F/U on PVD bilaterally and examine status of previous angioplasty   F/U on carotid artery disease     History of Present Illness     Dillon Wood is a 87 y.o. male who presents in follow-up vascular surgery consultation. Today, patient presents to the office with his son via telephone, grandson, wife.     Patient has a history of PAD and is s/p L SFA stent placement on 10/31/2020 by Dr. Deno Lunger at [facility] [place] performed for left foot rest pain and non-healing wound of the left foot plantar surface. He reports that this wound is still present, is healing, and is being managed by podiatrist, Dr. Ronny Flurry. He is seeing Dr. Gerre Pebbles at [facility] once weekly and has a follow-up visit scheduled tomorrow, 04/26/2021.  He reports that he has been wearing a special boot and has been told to avoid weight-bearing activities. For this reason, he is minimally ambulatory. Patient denies any ischemic claudication or rest pain.     He has a history of head and neck radiation. Patient denies symptoms of lateralizing neurologic ischemia including stroke, TIA, monocular visual changes, or amaurosis fugax.    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     Medically managed on aspirin and Eliquis (for DVT).  Patient was on statin in the past but is not currently on any statin therapy.    Past Medical History     Past Medical History:   Diagnosis Date    Closed fracture of left side of maxilla 04/03/2017    Facial contusion, initial encounter 04/03/2017    Gastroesophageal reflux  disease     Hyperlipemia     Left orbit fracture, closed, initial encounter 04/03/2017     Past Surgical History:   Procedure Laterality Date    ABDOMINAL SURGERY      Hernia repair     Allergies     Allergies   Allergen Reactions    Penicillins      Disorientation     Medications     Current Outpatient Medications:     apixaban (ELIQUIS) 5 MG, Take 5 mg by mouth every 12 (twelve) hours, Disp: , Rfl:     aspirin EC 81 MG EC tablet, Take 1 tablet (81 mg total) by mouth daily, Disp: 30 tablet, Rfl: 1    atorvastatin (LIPITOR) 40 MG tablet, Take 1 tablet (40 mg total) by mouth daily, Disp: 90 tablet, Rfl: 3    tamsulosin (FLOMAX) 0.4 MG Cap, Take 0.4 mg by mouth Daily after dinner, Disp: , Rfl:     Wound Dressings (Foam Dressing Circular Border) Pads, Apply 1 each topically daily, Disp: 30 each, Rfl: 5    Family History   Problem Relation Age of Onset    Heart disease Father     Stroke Father     Diabetes Maternal Aunt      Social History     Socioeconomic History    Marital status: Married    Number of children: 4  Occupational History    Occupation: Surveyor, [employer] of schools   Tobacco Use    Smoking status: Never     Passive exposure: Never    Smokeless tobacco: Never   Vaping Use    Vaping Use: Never used   Substance and Sexual Activity    Alcohol use: Yes     Comment: 3 per week    Drug use: No     Physical Exam     Vitals:    04/25/21 1248 04/25/21 1253   BP: 101/54 98/63   BP Site: Right arm Left arm   Patient Position: Sitting Sitting   Cuff Size: Medium Medium   Pulse: 68 68   Temp: 98.1 F (36.7 C)    TempSrc: Temporal    SpO2: 97%    Weight: 72.6 kg (160 lb)    Height: 1.778 m (5\' 10" )      Body mass index is 22.96 kg/m.    General:  Patient appears their stated age, well-nourished. Alert and in no apparent distress.  Neck: No JVD.  Lungs: Respiratory effort unlabored, chest expansion symmetric.   Abd: Soft, nondistended, nontender.    Extremities: Full ROM, symmetric, warm, no gangrene.   Skin: Warm and  intact. Approximately 1cm circular wound to plantar aspect of left foot between. Capillary refill < 2 seconds.  Neuro: Good insight and judgment, oriented to person, place, and time, gross motor and sensory intact. In a wheel-chair.     Pulse exam:   Radial Femoral Popliteal DP PT   Right Not assessed Not assessed Not assessed Nonpalpable Nonpalpable   Left Not assessed Not assessed Not assessed Nonpalpable Nonpalpable     Diagnostic Imaging   04/25/2021 Carotid Duplex performed in our vascular lab:  1. Luminal stenosis of the right internal carotid artery is estimated to be in the 80-99% range.   2. Luminal stenosis of the left internal carotid artery is estimated to be in the 1-49% range.   3. Antegrade blood flow is present in both vertebral arteries.   4. Patent bilateral subclavian arteries.     04/25/2021 Bilateral Lower Extremity Arterial Duplex performed in our vascular lab:  1. Right:-   2. Diffuse <50% diameter reduction on the right above the knee vessels.   3. Right anterior and posterior tibial arteries appear occluded.   4. Patent right peroneal artery with calcification.   5. Left:-   6. Patent right common femoral, deep femoral , femoral stent, popliteal stent, posterior tibial and peroneal arteries.   7. Left anterior tibial artery appears occluded.     04/25/2021 ABIs performed in our vascular lab:  - R ABI 0.85, R TBI 0.55  - L ABI 0.77, L TBI 0.39  - In conclusion, the ankle-brachial indices are in mild claudication bilaterally, consistent with peripheral vascular disease.     Assessment and Plan   Dillon Wood is a 87 y.o. male with a history of L SFA stent 10/31/2020 by Dr. Deno Lunger at [facility] [place] performed for left foot rest pain and non-healing wound of the left foot plantar surface, and head and neck radiation for squamous cell carcinoma.    - Patient and family report that his left foot plantar wound is slowly healing; he is seeing podiatry at [facility] weekly  for treatment. Arterial duplex performed in our vascular lab demonstrated R ABI 0.85, R TBI 0.55 and L ABI 0.77, L TBI 0.39. Recommend continued localized wound-care at this time.    -  Carotid duplex performed in our vascular lab demonstrated R ICA stenosis of 80-99% and L ICA stenosis of 1-49%. Patient denies symptoms of lateralizing neurologic ischemia including stroke, TIA, monocular visual changes, or amaurosis fugax.  Recommend MRA Neck for further evaluation of carotid stenosis. Follow-up in office after MRA to discuss results.    - Call 911 and proceed directly to the closest emergency department for any s/s of lateralizing neurologic ischemia, stroke, TIA, or amaurosis fugax.      We appreciate the opportunity to be involved in your patient's care. Please do not hesitate to contact the office with any questions or concerns.     Sincerely,  Crytal Pensinger McCaffrey-Lazo, FNP-C  Vascular Nurse Practitioner  [facility]  [address]  [place], [place] [address]  T  [phone] F [phone]     Romelle Starcher, MD was present at the end of the visit for plan confirmation.  Incident to service performed with physician present in the office in accordance with this patient's established plan of care.

## 2021-04-26 ENCOUNTER — Ambulatory Visit: Payer: Medicare Other | Attending: Foot & Ankle Surgery

## 2021-04-26 ENCOUNTER — Telehealth (INDEPENDENT_AMBULATORY_CARE_PROVIDER_SITE_OTHER): Payer: Self-pay

## 2021-04-26 ENCOUNTER — Other Ambulatory Visit (INDEPENDENT_AMBULATORY_CARE_PROVIDER_SITE_OTHER): Payer: Self-pay

## 2021-04-26 DIAGNOSIS — G603 Idiopathic progressive neuropathy: Secondary | ICD-10-CM | POA: Insufficient documentation

## 2021-04-26 DIAGNOSIS — M6702 Short Achilles tendon (acquired), left ankle: Secondary | ICD-10-CM | POA: Insufficient documentation

## 2021-04-26 DIAGNOSIS — M216X2 Other acquired deformities of left foot: Secondary | ICD-10-CM | POA: Insufficient documentation

## 2021-04-26 DIAGNOSIS — L97422 Non-pressure chronic ulcer of left heel and midfoot with fat layer exposed: Secondary | ICD-10-CM | POA: Insufficient documentation

## 2021-04-26 DIAGNOSIS — S91302S Unspecified open wound, left foot, sequela: Secondary | ICD-10-CM

## 2021-04-26 DIAGNOSIS — I6523 Occlusion and stenosis of bilateral carotid arteries: Secondary | ICD-10-CM

## 2021-04-26 DIAGNOSIS — I6529 Occlusion and stenosis of unspecified carotid artery: Secondary | ICD-10-CM

## 2021-04-26 NOTE — Telephone Encounter (Signed)
-   Pt spouse called, earliest MRA study offered in December.  - Per AM NP ok to change order to stat order.   - Spoke to son, advised order changed to stat. Gave contact number to [facility] [phone] advised to call to schedule ASAP and call back once scheduled for a f/u appt w/ Dr. Albertine Patricia.  - Sent copy of MRA order via MyChart.

## 2021-04-28 ENCOUNTER — Other Ambulatory Visit: Payer: Self-pay | Admitting: Specialist

## 2021-04-28 ENCOUNTER — Ambulatory Visit
Admission: RE | Admit: 2021-04-28 | Discharge: 2021-04-28 | Disposition: A | Discharge status: Home / Self Care | Payer: Medicare Other | Source: Ambulatory Visit | Attending: Specialist | Admitting: Specialist

## 2021-04-28 ENCOUNTER — Encounter (INDEPENDENT_AMBULATORY_CARE_PROVIDER_SITE_OTHER): Payer: Self-pay

## 2021-04-28 DIAGNOSIS — I6523 Occlusion and stenosis of bilateral carotid arteries: Secondary | ICD-10-CM | POA: Insufficient documentation

## 2021-04-28 DIAGNOSIS — I6529 Occlusion and stenosis of unspecified carotid artery: Secondary | ICD-10-CM

## 2021-04-28 MED ORDER — GADOBUTROL 1 MMOL/ML IV SOSY (WRAP)
10.0000 mL | Freq: Once | INTRAVENOUS | Status: AC
Start: 2021-04-28 — End: 2021-04-28
  Administered 2021-04-28: 10 mL via INTRAVENOUS
  Filled 2021-04-28: qty 10

## 2021-04-28 NOTE — Progress Notes (Signed)
Appt Date/Time: 05/02/21 @  2:10 pm    87 y.o. male   PCP: Hilma Favors, PA  Referring Physician:         Appointment Type: Follow-Up  Imaging:  04/28/21 MRA neck    Chief Complaint/HPI: F/u Bilateral carotid stenosis  -Hx PAD and is s/p L SFA stent placement on 10/31/2020.   -Hx left foot plantar wound, [facility] weekly for treatment     Date of Last Visit:   Recommend MRA Neck for further evaluation of carotid stenosis. Follow-up in office after MRA to discuss results     Allergies:   Allergies   Allergen Reactions    Penicillins      Disorientation         Selected Medications:  Aspirin, Atorvastatin, and Eliquis    Focused Prior Medical History:  n/a             Smoker           Former Smoker    x      Never Smoker      Vital Signs this Visit Pulses  HT  BP R BP L Temp   Rad Ulnar Brach Fem Pop DP PT        R          WT  Pulse R Pulse L SpO2       L          Imaging results:        Plan of Care:

## 2021-05-01 ENCOUNTER — Telehealth (INDEPENDENT_AMBULATORY_CARE_PROVIDER_SITE_OTHER): Payer: Self-pay | Admitting: Physician Assistant

## 2021-05-01 ENCOUNTER — Telehealth (INDEPENDENT_AMBULATORY_CARE_PROVIDER_SITE_OTHER): Payer: Self-pay

## 2021-05-01 ENCOUNTER — Encounter (INDEPENDENT_AMBULATORY_CARE_PROVIDER_SITE_OTHER): Payer: Self-pay | Admitting: Specialist

## 2021-05-01 NOTE — Progress Notes (Signed)
MRA reviewed. Per report, patient with R ICA 90% stenosis and L ICA 50-60% stenosis. Patient scheduled to follow up with Dr. Lucianne Muss on 11/8 to review and discuss findings

## 2021-05-01 NOTE — Telephone Encounter (Signed)
Patients daughter called to report that patients would like the Flu and COVID vaccines.

## 2021-05-01 NOTE — Telephone Encounter (Signed)
Called [facility] [phone] MRA neck image report not completed on website,called office asking to be completed as appt is 05/02/21 with Dr. Lucianne Muss ( Vascular Surgery).  Sarah from [facility] calling their office to expedite process.

## 2021-05-02 ENCOUNTER — Ambulatory Visit (INDEPENDENT_AMBULATORY_CARE_PROVIDER_SITE_OTHER): Payer: Medicare Other | Admitting: Specialist

## 2021-05-02 ENCOUNTER — Encounter (INDEPENDENT_AMBULATORY_CARE_PROVIDER_SITE_OTHER): Payer: Self-pay | Admitting: Specialist

## 2021-05-02 VITALS — BP 108/67 | HR 67 | Temp 98.4°F

## 2021-05-02 DIAGNOSIS — I739 Peripheral vascular disease, unspecified: Secondary | ICD-10-CM

## 2021-05-02 DIAGNOSIS — S81802A Unspecified open wound, left lower leg, initial encounter: Secondary | ICD-10-CM

## 2021-05-02 DIAGNOSIS — I6523 Occlusion and stenosis of bilateral carotid arteries: Secondary | ICD-10-CM

## 2021-05-02 NOTE — Progress Notes (Addendum)
OFFICE VISIT  Vascular and Endovascular Surgery    Date: May 02, 2021  Consulting Physician: Romelle Starcher, MD   Consulting Service: Vascular and Endovascular Surgery    Chief Complaint   Patient presents with    Follow-up     F/u Bilateral carotid stenosis     History of Present Illness     Dillon Wood is a 87 y.o. male who presents in follow-up vascular surgery consultation to discuss the results of MRA Neck ordered to further evaluate finding of 80-99% R ICA stenosis on 04/25/2021 Carotid Duplex. He has a history of head and neck radiation for skin cancer. Patient denies symptoms of lateralizing neurologic ischemia including stroke, TIA, monocular visual changes, or amaurosis fugax.     Today, patient presents to the office with his grandson, Dillon Wood.    Patient also has a history of PAD and is s/p L SFA stent placement on 10/31/2020 by Dr. Deno Lunger at [facility] performed for left foot rest pain and non-healing wound of the left foot plantar surface. He reports that this wound is still present, is healing, and is being managed by podiatrist, Dr. Ronny Flurry. He is seeing Dr. Gerre Pebbles at [facility] once weekly and has a follow-up visit scheduled tomorrow, 05/03/2021.  He reports that he has been wearing a special boot and has been told to avoid weight-bearing activities. For this reason, he is minimally ambulatory. Patient denies any ischemic claudication or rest pain.     When initially seen by our office 04/25/2021, arterial duplex demonstrated patent left common femoral, deep femoral, femoral stent, popliteal stent, posterior tibial and peroneal arteries,  left anterior tibial artery occlusion, and L ABI of 0.77. Since the wound was reported to be healing with localized wound care, patient was advised to continue treating the wound with localized wound care. Today he reports that Dr. Gerre Pebbles referred patient to another vascular provider located out of the [facility] who has recommended angiogram with possible intervention for wound-healing.    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     Medically managed on aspirin, Eliquis (for DVT), and Atorvastatin 40mg .   Past Medical History     Past Medical History:   Diagnosis Date    Closed fracture of left side of maxilla 04/03/2017    Facial contusion, initial encounter 04/03/2017    Gastroesophageal reflux disease     Hyperlipemia     Left orbit fracture, closed, initial encounter 04/03/2017     Past Surgical History:   Procedure Laterality Date    ABDOMINAL SURGERY      Hernia repair     Allergies     Allergies   Allergen Reactions    Penicillins      Disorientation     Medications     Current Outpatient Medications:     apixaban (ELIQUIS) 5 MG, Take 1 tablet (5 mg) by mouth every 12 (twelve) hours, Disp: , Rfl:     aspirin EC 81 MG EC tablet, Take 1 tablet (81 mg total) by mouth daily, Disp: 30 tablet, Rfl: 1    atorvastatin (LIPITOR) 40 MG tablet, Take 1 tablet (40 mg total) by mouth daily, Disp: 90 tablet, Rfl: 3    tamsulosin (FLOMAX) 0.4 MG Cap, Take 1 capsule (0.4 mg) by mouth Daily after dinner, Disp: ,  Rfl:     Wound Dressings (Foam Dressing Circular Border) Pads, Apply 1 each topically daily, Disp: 30 each, Rfl: 5    Family History   Problem Relation Age of Onset    Heart disease Father     Stroke Father     Diabetes Maternal Aunt      Social History     Socioeconomic History    Marital status: Married    Number of children: 4   Occupational History    Occupation: Surveyor, quantity of schools   Tobacco Use    Smoking status: Never     Passive exposure: Never    Smokeless tobacco: Never   Vaping Use    Vaping Use: Never used   Substance and Sexual Activity    Alcohol use: Yes     Comment: 3 per week    Drug use: No     Physical Exam     Vitals:     05/02/21 1414 05/02/21 1418   BP: 122/70 108/67   BP Site: Left arm Right arm   Patient Position: Sitting Sitting   Cuff Size: Medium Medium   Pulse: 68 67   Temp: 98.4 F (36.9 C)    TempSrc: Temporal    SpO2: 93%      There is no height or weight on file to calculate BMI.    General:  Patient appears their stated age, well-nourished.  Alert and in no apparent distress.  Neck:  No JVD.  Lungs: Respiratory effort unlabored, chest expansion symmetric.   Abd: Soft, nondistended, nontender.    Extremities: Full ROM, symmetric.   Skin: Warm and intact.   Neuro: Good insight and judgment, oriented to person, place, and time, gross motor and sensory intact. In a wheel-chair    Diagnostic Imaging   04/28/2021 MRA Neck performed at [facility]:  1.  There is greater than 90% stenosis of the proximal right internal  carotid artery based on NASCET criteria.    2.  There is 50-60% stenosis of the proximal left internal carotid  artery based on NASCET criteria.  3.  Mild-moderate distal left CCA stenosis.  4.  The vertebral arteries appear widely patent.    04/25/2021 Carotid Duplex performed in our vascular lab:  1. Luminal stenosis of the right internal carotid artery is estimated to be in the 80-99% range.   2. Luminal stenosis of the left internal carotid artery is estimated to be in the 1-49% range.   3. Antegrade blood flow is present in both vertebral arteries.   4. Patent bilateral subclavian arteries.     04/25/2021 Bilateral Lower Extremity Arterial Duplex performed in our vascular lab:  1. Right:-   2. Diffuse <50% diameter reduction on the right above the knee vessels.   3. Right anterior and posterior tibial arteries appear occluded.   4. Patent right peroneal artery with calcification.   5. Left:-   6. Patent left common femoral, deep femoral , femoral stent, popliteal stent, posterior tibial and peroneal arteries.   7. Left anterior tibial artery appears occluded.     04/25/2021 ABIs performed in our vascular  lab:  - R ABI 0.85, R TBI 0.55  - L ABI 0.77, L TBI 0.39  - In conclusion, the ankle-brachial indices are in mild claudication bilaterally, consistent with peripheral vascular disease.     Assessment and Plan   Dillon Wood is a 87 y.o. male with a history of L SFA stent 10/31/2020 by Dr. Deno Lunger at  [facility] performed for left foot rest pain and non-healing wound of the left foot plantar surface, and radiation induced carotid artery stenosis.     - Carotid duplex performed in our vascular lab demonstrated 80-99% R ICA stenosis. MRA Neck performed 04/28/2021 demonstrated R ICA stenosis of greater than 90%. Patient was advised by Dr. Lucianne Muss that he has a current stroke risk of 2-3% per year. He was offered CEA with local anesthesia. He will discuss this with his family and contact our office if he decides to proceed.    - Call 911 and proceed directly to the closest emergency department for any s/s of lateralizing neurologic ischemia, stroke, TIA, or amaurosis fugax. Continue Aspirin and Atorvastatin for medical management of carotid disease.    - Patient has been offered LLE angiogram by an outside provider and states that podiatry does not believe that left plantar wound is healing. Patient was advised that he can proceed with angiogram with outside provider as planned.        Sincerely,  Dabney Dever McCaffrey-Lazo, FNP-C  Vascular Nurse Practitioner  [facility]  [address]  [place], [place] [address]  T  [phone]  F [phone]     Romelle Starcher, MD was present at the end of the visit for plan confirmation.  Incident to service performed with physician present in the office in accordance with this patient's established plan of care.

## 2021-05-03 ENCOUNTER — Other Ambulatory Visit (INDEPENDENT_AMBULATORY_CARE_PROVIDER_SITE_OTHER): Payer: Self-pay

## 2021-05-03 ENCOUNTER — Ambulatory Visit: Payer: Medicare Other | Attending: Foot & Ankle Surgery

## 2021-05-03 ENCOUNTER — Ambulatory Visit: Payer: Medicare Other | Admitting: Physician Assistant

## 2021-05-03 VITALS — BP 116/72 | HR 75

## 2021-05-03 DIAGNOSIS — S91302S Unspecified open wound, left foot, sequela: Secondary | ICD-10-CM

## 2021-05-03 DIAGNOSIS — M6702 Short Achilles tendon (acquired), left ankle: Secondary | ICD-10-CM | POA: Insufficient documentation

## 2021-05-03 DIAGNOSIS — I6529 Occlusion and stenosis of unspecified carotid artery: Secondary | ICD-10-CM

## 2021-05-03 DIAGNOSIS — M216X2 Other acquired deformities of left foot: Secondary | ICD-10-CM | POA: Insufficient documentation

## 2021-05-03 DIAGNOSIS — R296 Repeated falls: Secondary | ICD-10-CM

## 2021-05-03 DIAGNOSIS — L97422 Non-pressure chronic ulcer of left heel and midfoot with fat layer exposed: Secondary | ICD-10-CM | POA: Insufficient documentation

## 2021-05-03 DIAGNOSIS — G603 Idiopathic progressive neuropathy: Secondary | ICD-10-CM | POA: Insufficient documentation

## 2021-05-03 NOTE — Progress Notes (Signed)
See intellicure

## 2021-05-04 ENCOUNTER — Telehealth (INDEPENDENT_AMBULATORY_CARE_PROVIDER_SITE_OTHER): Payer: Self-pay | Admitting: Physician Assistant

## 2021-05-04 ENCOUNTER — Other Ambulatory Visit (INDEPENDENT_AMBULATORY_CARE_PROVIDER_SITE_OTHER): Payer: Self-pay | Admitting: Specialist

## 2021-05-04 ENCOUNTER — Telehealth (INDEPENDENT_AMBULATORY_CARE_PROVIDER_SITE_OTHER): Payer: Self-pay

## 2021-05-04 DIAGNOSIS — Z01818 Encounter for other preprocedural examination: Secondary | ICD-10-CM

## 2021-05-04 DIAGNOSIS — I739 Peripheral vascular disease, unspecified: Secondary | ICD-10-CM

## 2021-05-04 NOTE — Telephone Encounter (Signed)
Below medication instructions forwarded to surgery scheduler, Ivin Booty, to be sent to patient with pre-op packet:      Pre-Day of Surgery Medication Instructions:  - Eliquis: this must be stopped 48 hours prior to surgery. The last day it should be taken is 11/20. Please contact the provider managing this medication and request clearance to hold it as instructed here.      Day of Surgery Medication Instructions:  - You must have nothing to eat or drink from midnight onwards the morning of surgery.   - Do not take the following medications the morning of surgery:   - Vitamins or supplements     - You can take the following medications on your normal schedule. Anything taken in the evening can be taken as normal the night before your procedure. Anything taken in the morning can be taken the morning of your surgery with small sips of water only.   - aspirin   - atorvastatin/Lipitor   - tamsulosin/Flomax          Pre-Op Testing:  - CBC, BMP: TBD @ [facility]  - Cath lab/hydration orders: pend labs

## 2021-05-04 NOTE — Telephone Encounter (Signed)
Received call from woman at Dr. Lucianne Muss -Vascular Surgeon's office saying the pt will need lab work done.  LPN gave woman phone and fax for [facility] so she could send him the orders.  She will send.

## 2021-05-04 NOTE — Telephone Encounter (Addendum)
Patient scheduled for:05/17/2021    Surgeon: Romelle Starcher, MD  Procedure: LLE Angiogram  Arrive at: 7:00 am  Anesthesia: IV Sedation    Location: Cath Lab  Special instructions: NPO   Knows to have Labs: To be done with [facility] [phone]  Need medication instructions: Yes    Please send via email     Post op: 11/27 at 1:00 pm    CPT [id number]  ICD Code:I 73.9 PAD  Admit Status:Outpatient      Please email orders to:  [email or web address]  Sometimes email is encrypted    Also fax to: [phone] attention [facility]  Please say that this patient is a Health Call Patient

## 2021-05-05 ENCOUNTER — Ambulatory Visit (INDEPENDENT_AMBULATORY_CARE_PROVIDER_SITE_OTHER): Payer: Medicare Other | Admitting: Specialist

## 2021-05-07 ENCOUNTER — Encounter (INDEPENDENT_AMBULATORY_CARE_PROVIDER_SITE_OTHER): Payer: Self-pay | Admitting: Physician Assistant

## 2021-05-07 NOTE — Assessment & Plan Note (Signed)
A/P: Will follow vascular recs and follow up post-op.

## 2021-05-07 NOTE — Assessment & Plan Note (Signed)
A/P: Continue wound care.  Rx for ALF staff to assist patient to and from dining room sent to ALF.  Monitor.

## 2021-05-07 NOTE — Progress Notes (Signed)
Date: 05/03/2021    Patient Name: Dillon Wood, Dillon Wood    Patient was seen in their home (POS 12) in lieu of an office visit for the following reason:   Requires use of an assistive device in order to ambulate due to poor balance.    LEGAL DOCUMENTS and CODE STATUS:   Advance Directive Received: No     Type of Document Received:   needs new forms as moved from [place]        Code Status: FULL    Chief Complaint   Patient presents with    Wound Check        HPI:   History and ROS obtained from patient, wife, daughters on phone    Dillon Wood is a 87 y.o. year old male seen today in follow up.     Problem   Open Wound of Plantar Aspect of Foot, Left, Sequela    Patient with h/o vascular insufficiency s/p repair 11/2020 and had a similar left foot plantar MTP wound at that time. Patient walks on the front of his feet, "like he's tip-toeing" per daughter.  Wound clinic follows and patient has a follow up appt this afternoon.  Wound improving and minor on exam today.  Wearing post-op shoe and no longer ambulating secondary to this.     Carotid Artery Stenosis    Patient scheduled for surgery in 2 weeks.          HPI    Past Medical History:   Diagnosis Date    Closed fracture of left side of maxilla 04/03/2017    Facial contusion, initial encounter 04/03/2017    Gastroesophageal reflux disease     Hyperlipemia     Left orbit fracture, closed, initial encounter 04/03/2017     Past Surgical History:   Procedure Laterality Date    ABDOMINAL SURGERY      Hernia repair     Family History   Problem Relation Age of Onset    Heart disease Father     Stroke Father     Diabetes Maternal Aunt      Social History     Tobacco Use    Smoking status: Never     Passive exposure: Never    Smokeless tobacco: Never   Vaping Use    Vaping Use: Never used   Substance Use Topics    Alcohol use: Yes     Comment: 3 per week    Drug use: No     Allergies   Allergen Reactions    Penicillins      Disorientation       MEDICATIONS:     Current  Outpatient Medications:     apixaban (ELIQUIS) 5 MG, Take 1 tablet (5 mg) by mouth every 12 (twelve) hours, Disp: , Rfl:     aspirin EC 81 MG EC tablet, Take 1 tablet (81 mg total) by mouth daily, Disp: 30 tablet, Rfl: 1    atorvastatin (LIPITOR) 40 MG tablet, Take 1 tablet (40 mg total) by mouth daily, Disp: 90 tablet, Rfl: 3    tamsulosin (FLOMAX) 0.4 MG Cap, Take 1 capsule (0.4 mg) by mouth Daily after dinner, Disp: , Rfl:     Wound Dressings (Foam Dressing Circular Border) Pads, Apply 1 each topically daily, Disp: 30 each, Rfl: 5    ROS:   Review of Systems   Constitutional:  Negative for chills and fever.   Respiratory:  Negative for cough, choking and shortness of breath.  Cardiovascular:  Negative for palpitations and leg swelling.   Gastrointestinal:  Negative for constipation and diarrhea.   Musculoskeletal:  Positive for gait problem (ambulates with cane).   Skin:  Negative for wound.   Neurological:  Negative for headaches.   Psychiatric/Behavioral:  Negative for behavioral problems.      All other systems pertinent to the chief complaint reviewed and otherwise negative     PHYSICAL EXAM:   BP 116/72   Pulse 75   SpO2 97%    Wt Readings from Last 1 Encounters:   04/25/21 72.6 kg (160 lb)      Ht Readings from Last 1 Encounters:   04/25/21 1.778 m (5\' 10" )     Physical Exam  Constitutional:       General: He is not in acute distress.     Appearance: He is not ill-appearing, toxic-appearing or diaphoretic.      Comments: Conversant and clear   Eyes:      General: No scleral icterus.     Extraocular Movements: Extraocular movements intact.   Neck:      Vascular: No carotid bruit.   Cardiovascular:      Rate and Rhythm: Normal rate and regular rhythm.      Heart sounds: No murmur heard.  Pulmonary:      Effort: No respiratory distress.      Breath sounds: No wheezing, rhonchi or rales.   Abdominal:      General: There is no distension.      Tenderness: There is no abdominal tenderness. There is no  guarding or rebound.   Musculoskeletal:      Cervical back: No rigidity or tenderness.      Right lower leg: No edema.      Left lower leg: No edema.      Comments: Cannot palpate L DP but foot warm and pink.  Scar at heel L from previous wound   Lymphadenopathy:      Cervical: No cervical adenopathy.   Neurological:      General: No focal deficit present.      Mental Status: He is oriented to person, place, and time.      Comments: 3/3 word recall   Psychiatric:         Mood and Affect: Mood normal.         Behavior: Behavior normal.     DIAGNOSTICS:     Lab Results   Component Value Date    WBC 7.67 04/02/2017    HGB 12.8 (L) 04/02/2017    HCT 37.0 (L) 04/02/2017    PLT 136 (L) 04/02/2017    NA 140 04/02/2017    K 3.5 04/02/2017    CL 108 04/02/2017    CREAT 1.2 04/02/2017    BUN 14 04/02/2017    CO2 23 04/02/2017    INR 1.1 04/02/2017    GLU 96 04/02/2017     XR Foot Left AP Lateral And Oblique    Result Date: 04/20/2021   No bony erosive changes or fractures seen Laurena Slimmer, MD  04/20/2021 8:16 AM    MR Angiogram Neck W WO Contrast    Result Date: 05/01/2021   1.  There is greater than 90% stenosis of the proximal right internal carotid artery based on NASCET criteria.  2.  There is 50-60% stenosis of the proximal left internal carotid artery based on NASCET criteria. 3.  Mild-moderate distal left CCA stenosis. 4.  The vertebral arteries appear widely patent.  Electronically signed by: Leandro Reasoner M.D. [facility] PM: 05/01/21     ASSESSMENT and PLAN:   Carotid artery stenosis  A/P: Will follow vascular recs and follow up post-op.     Open wound of plantar aspect of foot, left, sequela  A/P: Continue wound care.  Rx for ALF staff to assist patient to and from dining room sent to ALF.  Monitor.      No orders of the defined types were placed in this encounter.      Electronically Signed by Hilma Favors, PA      Date: 05/03/2021    Patient Name: Dillon Wood    Medicare AWV  Note:    Patient was seen in their home (POS 12) in lieu of an office visit for the following reason:   Requires use of an assistive device in order to ambulate due to poor balance.    LEGAL DOCUMENTS and CODE STATUS:   Advance Directive Received: No     Type of Document Received:   Needs [place] forms        Code Status: FULL    Health Risk Assessment:   1)  What is your living situation?  Independent at home    2)  Do you have help available to you for activities of daily living ? no, I do not need help    3)  Do you have help available to you for medication administration?  yes, family provides assistance    4)  Can you handle your money without help? yes    5)  Do you have trouble paying for your medications?  no     6)  Do you worry about falling? yes    7)  Have you had any falls in the past year? yes, no injury    8)  Are you using any of the following to avoid falls?  cane and walker    9)  Mobility Rating:  ambulating with no exercise    10)  Home Safety Evaluation:   currently living in hotel    11)  DME in home:  walker and cane    12)  Are you feeling down, depressed or hopeless? no     13)  Are you finding joy in anything? yes     14)  Has your weight changed in the past 6 months? no change in weight      Additional Concerns    Patient Care Team:  Hilma Favors, PA as PCP - General (Physician Assistant)  Deberah Castle, MSW as Social Worker  Carmine Savoy., RN as Registered Nurse  Fransico Setters, MD as Consulting Physician (Vascular Surgery)  Johney Maine, DPM as Consulting Physician (Foot Surgery)    Past Medical History:   Diagnosis Date    Closed fracture of left side of maxilla 04/03/2017    Facial contusion, initial encounter 04/03/2017    Gastroesophageal reflux disease     Hyperlipemia     Left orbit fracture, closed, initial encounter 04/03/2017     Past Surgical History:   Procedure Laterality Date    ABDOMINAL SURGERY      Hernia repair     Allergies   Allergen Reactions     Penicillins      Disorientation      Outpatient Medications Marked as Taking for the 05/03/21 encounter (Office Visit) with Hilma Favors, PA   Medication Sig Dispense Refill  apixaban (ELIQUIS) 5 MG Take 1 tablet (5 mg) by mouth every 12 (twelve) hours      aspirin EC 81 MG EC tablet Take 1 tablet (81 mg total) by mouth daily 30 tablet 1    atorvastatin (LIPITOR) 40 MG tablet Take 1 tablet (40 mg total) by mouth daily 90 tablet 3    tamsulosin (FLOMAX) 0.4 MG Cap Take 1 capsule (0.4 mg) by mouth Daily after dinner      Wound Dressings (Foam Dressing Circular Border) Pads Apply 1 each topically daily 30 each 5     Social History     Tobacco Use    Smoking status: Never     Passive exposure: Never    Smokeless tobacco: Never   Vaping Use    Vaping Use: Never used   Substance Use Topics    Alcohol use: Yes     Comment: 3 per week    Drug use: No      Family History   Problem Relation Age of Onset    Heart disease Father     Stroke Father     Diabetes Maternal Aunt          The following sections were reviewed this encounter by the provider:   Tobacco  Allergies  Meds  Problems  Med Hx  Surg Hx  Fam Hx           Hospitalizations:   hospitalization within past 6 months - discharge diagnosis Bladder stone.  PVD    Depression Screening:   No data recorded     Functional Assessment:   Falls Risk:  timed "Get Up and Go Evaluation" < 20 seconds  Hearing:  hearing significantly decreased  Exercise:  no exercise  ADL's:   Bathing - independent   Dressing - independent   Mobility - independent   Transfer - independent   Eating - independent}   Toileting - independent   ADL assistance not needed    Assessment:   BP 116/72   Pulse 75   SpO2 97%      Cognitive Function:   Mood/affect: Appropriate  Appearance: neatly groomed, appropriately and adequately nourished  Family member/caregiver input:  needs referrals to specialists        AWV Mini-Cog Result:  > 3 points - negative screen for  dementia      Assessment/Plan:   1. Stenosis of carotid artery, unspecified laterality    2. Open wound of plantar aspect of foot, left, sequela        Hilma Favors, PA  05/07/2021

## 2021-05-08 ENCOUNTER — Other Ambulatory Visit (INDEPENDENT_AMBULATORY_CARE_PROVIDER_SITE_OTHER): Payer: Self-pay | Admitting: Physician Assistant

## 2021-05-08 ENCOUNTER — Telehealth (INDEPENDENT_AMBULATORY_CARE_PROVIDER_SITE_OTHER): Payer: Self-pay | Admitting: Physician Assistant

## 2021-05-08 DIAGNOSIS — R296 Repeated falls: Secondary | ICD-10-CM

## 2021-05-08 NOTE — Telephone Encounter (Signed)
Discussed with PCP.  Wound is much better.  He does need Dermatology and cannot get into the [facility] clinic until March.  PCP will find Dermatologist

## 2021-05-08 NOTE — Telephone Encounter (Signed)
LPN faxed nursing order to [facility].      Called [facility] and Hadja confirmed order received.

## 2021-05-08 NOTE — Telephone Encounter (Signed)
-----   Message from Hilma Favors, [place] sent at 05/08/2021  9:23 AM EST -----  Please fax nursing order to ALF, thanks!

## 2021-05-08 NOTE — Addendum Note (Signed)
Addended by: Terrance Mass on: 05/08/2021 09:23 AM     Modules accepted: Orders

## 2021-05-09 ENCOUNTER — Encounter (INDEPENDENT_AMBULATORY_CARE_PROVIDER_SITE_OTHER): Payer: Self-pay | Admitting: Specialist

## 2021-05-09 NOTE — Telephone Encounter (Signed)
-  Spoke with Otilio Miu, LPN with [facility]  -Nicole Cella states they are not able to draw labs for Pt, outside phlebotomy groups are normally who comes to Pt's home if family is willing to pay fee, otherwise would need to send Pt to outpatient lab  -As courtesy Nicole Cella has emailed CBC and BMP orders to [facility], family confirmed they will cover fee to have labs done in Pt's home   -[facility] will send labs to either [facility] or [facility], following for surgery on 11/23   -If needed, contact info for [facility] is [phone]    Pre-Op Testing:  - CBC, BMP: TBD @ [facility] or [facility] as drawn by [facility]   - Cath lab/hydration orders: pend labs

## 2021-05-09 NOTE — Telephone Encounter (Signed)
Received email from [facility] that he never received lab orders from Vascular office.  LPN called Vascular office and spoke to RN Courtney and explained [facility] does not do labs, and let her know I provided contact info for [facility]  LPN let Toni Amend know I will send orders for them, however for the future they need to be the ones to send lab orders.      Called son and verified they will pay the mobile phlebotomy fee.

## 2021-05-10 ENCOUNTER — Ambulatory Visit: Payer: Medicare Other | Attending: Foot & Ankle Surgery

## 2021-05-10 DIAGNOSIS — M216X2 Other acquired deformities of left foot: Secondary | ICD-10-CM | POA: Insufficient documentation

## 2021-05-10 DIAGNOSIS — L97422 Non-pressure chronic ulcer of left heel and midfoot with fat layer exposed: Secondary | ICD-10-CM | POA: Insufficient documentation

## 2021-05-10 DIAGNOSIS — M6702 Short Achilles tendon (acquired), left ankle: Secondary | ICD-10-CM | POA: Insufficient documentation

## 2021-05-10 DIAGNOSIS — G603 Idiopathic progressive neuropathy: Secondary | ICD-10-CM | POA: Insufficient documentation

## 2021-05-10 DIAGNOSIS — S91302S Unspecified open wound, left foot, sequela: Secondary | ICD-10-CM

## 2021-05-10 NOTE — Telephone Encounter (Signed)
Lpn spoke to daughter and let her know [facility] is out of COVID, however we have the flu vaccine.  Gave daughter health Dept number.  They were going to call her in 1-2 days for appts.  Daughter said her son will be taking the pt out for wound

## 2021-05-10 NOTE — Progress Notes (Signed)
Wound

## 2021-05-11 ENCOUNTER — Encounter (INDEPENDENT_AMBULATORY_CARE_PROVIDER_SITE_OTHER): Payer: Self-pay | Admitting: Physician Assistant

## 2021-05-11 NOTE — Telephone Encounter (Signed)
-  Call received from Pt's Son/Dillon Wood inquiring if Dr. Lucianne Muss needed to speak with Dr. Deno Lunger (Pt's previous vascular surgeon in [place]) prior to upcoming angio on 11/23 with [facility]  -Advised son that no notes in Epic indicate that Dr. Lucianne Muss needs to speak or has spoken with Dr. Deno Lunger, but would look into this and return his call   -Son verbalized understanding of instructions and had no questions or concerns at this time.

## 2021-05-11 NOTE — Telephone Encounter (Signed)
LPN verified in [facility] pt received the Flu vaccine on 05/11/21.  Updated pt's chart.  Left VM on [facility] phone to call this LPN back to discuss getting vaccination records from MD, or pt.

## 2021-05-12 ENCOUNTER — Encounter (INDEPENDENT_AMBULATORY_CARE_PROVIDER_SITE_OTHER): Payer: Medicare Other

## 2021-05-12 NOTE — Telephone Encounter (Addendum)
-   Per Dr. Lucianne Muss,     "This is not urgent at all and as long the wound is healing no angiogram needs to be performed. The only reason we had scheduled him was because Dr. Gerre Pebbles was not happy with the wound and had referred him for an angiogram in [place]. So as long the wound is healing and it is clean he should not undergo angiogram I will be more than glad to talk to the son if he wants me to please send me his phone number and I will call him"    - Returned call to pt's son and relayed above info. He voiced understanding and will continue to have pt f/u with Dr. Gerre Pebbles.   - Dr. Lucianne Muss notified and message sent to [facility] to cancel sx.

## 2021-05-12 NOTE — Telephone Encounter (Signed)
-   Secure chat sent to Dr. Lucianne Muss asking if he requires more information from Dr. Georgetta Haber office regarding L SFA stent in 10/2020. Awaiting response.

## 2021-05-12 NOTE — Nursing Progress Note (Signed)
Called son [name] and he requested that I reach out to MD's involved and so I sent a secured chat to Dr. Lucianne Muss, Dr. Gerre Pebbles, and Feliberto Gottron, RN  "

## 2021-05-12 NOTE — Telephone Encounter (Addendum)
-   Per Dr. Lucianne Muss, he does not need more information from Dr. Deno Lunger, as 11/01 [place] provides enough information.   - Relayed this to pt's son Dillon Wood), who asks why we are performing the "same exact surgery" that Dr. Deno Lunger did in May 2022.   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   - Per pt's son, Dr. Gerre Pebbles with podiatry saw pt on 11/16 and stated wound has healed and that pt no longer needs to be seen in wound clinic, only f/u with his office.   - Called Dr. Judeth Cornfield office (T: [phone]) and requested notes. Incoming via fax.   - Pt's son also requested to determine urgency of procedure with Dr. Lucianne Muss, as they are interested in postponing it given upcoming holidays.   - Message sent to Dr. Lucianne Muss. Awaiting response.

## 2021-05-17 ENCOUNTER — Ambulatory Visit: Admission: RE | Admit: 2021-05-17 | Payer: Medicare Other | Source: Ambulatory Visit | Admitting: Specialist

## 2021-05-17 ENCOUNTER — Encounter: Admission: RE | Payer: Self-pay | Source: Ambulatory Visit

## 2021-05-17 SURGERY — ARTERIAL-  LOWER EXTREMITY ANGIOGRAPHY POSS PTA
Anesthesia: Conscious Sedation | Site: Leg Lower | Laterality: Left

## 2021-05-23 ENCOUNTER — Telehealth (INDEPENDENT_AMBULATORY_CARE_PROVIDER_SITE_OTHER): Payer: Self-pay

## 2021-05-23 NOTE — Telephone Encounter (Signed)
Medical House Calls     SW attempted to reach the patient's daughter for the 3rd time to answer questions about the possibility of applying for Medicaid.      Arlyn Dunning, MSW  Social Work Sports coach  [employer]   [phone]

## 2021-06-02 ENCOUNTER — Telehealth (INDEPENDENT_AMBULATORY_CARE_PROVIDER_SITE_OTHER): Payer: Self-pay | Admitting: Physician Assistant

## 2021-06-02 DIAGNOSIS — I739 Peripheral vascular disease, unspecified: Secondary | ICD-10-CM

## 2021-06-02 DIAGNOSIS — R296 Repeated falls: Secondary | ICD-10-CM

## 2021-06-02 NOTE — Telephone Encounter (Signed)
LPN spoke to daughter heather and she is requesting PT, OT, and SN for the pt.  He did go to wound center in [facility], however needs wound care at facility.  LPN let her know we can send to [facility] or [place].      2. [name] would like to discuss the pt getting script for Meclizine which he had PRN from [place], and may need again.    3.  Received new Medicaid ins cards.  Uploaded into chart.

## 2021-06-07 NOTE — Telephone Encounter (Signed)
Faxed HH orders to [facility].  Will call to confirm received.

## 2021-06-08 NOTE — Telephone Encounter (Signed)
Called [place] and [facility] confirmed orders received and nurse will see pt tomorrow.

## 2021-06-09 ENCOUNTER — Telehealth (INDEPENDENT_AMBULATORY_CARE_PROVIDER_SITE_OTHER): Payer: Self-pay

## 2021-06-09 NOTE — Telephone Encounter (Signed)
Patient's wound is healing. No need to see Dr. Lucianne Muss on 06/20/21. Canceled ultrasound and appointment for that day. Patient will call back to schedule as needed.

## 2021-06-09 NOTE — Telephone Encounter (Signed)
Nurse Inocencio Homes from [facility] called to let PCP know that she saw patient today. Inocencio Homes stated that patient has non removable dressing and ortho advised patient not to ambulate and dressing change until he see patient next week.  Inocencio Homes stated that they can't start care until patient seen by ortho next week.

## 2021-06-15 NOTE — Telephone Encounter (Signed)
This lpn called pt dtr [name] to confirm if COVID booster administered; [name] informed booster not given advs was told pt shouldn't get booster due to pt having had COVID in last 90 days. [name] wasn't able to give exact date of pt being COVID(+), advs will contact [facility] office if she decides to move forward with scheduling COVID booster.

## 2021-06-20 ENCOUNTER — Other Ambulatory Visit (INDEPENDENT_AMBULATORY_CARE_PROVIDER_SITE_OTHER): Payer: Medicare Other

## 2021-06-20 ENCOUNTER — Encounter (INDEPENDENT_AMBULATORY_CARE_PROVIDER_SITE_OTHER): Payer: Self-pay | Admitting: Physician Assistant

## 2021-06-20 ENCOUNTER — Ambulatory Visit (INDEPENDENT_AMBULATORY_CARE_PROVIDER_SITE_OTHER): Payer: Medicare Other | Admitting: Specialist

## 2021-06-20 ENCOUNTER — Encounter (INDEPENDENT_AMBULATORY_CARE_PROVIDER_SITE_OTHER): Payer: Medicare Other | Admitting: Specialist

## 2021-06-21 ENCOUNTER — Encounter (INDEPENDENT_AMBULATORY_CARE_PROVIDER_SITE_OTHER): Payer: Self-pay | Admitting: Physician Assistant

## 2021-06-28 ENCOUNTER — Ambulatory Visit: Payer: Medicare Other | Admitting: Physician Assistant

## 2021-06-28 VITALS — BP 137/68 | HR 69

## 2021-06-28 DIAGNOSIS — L989 Disorder of the skin and subcutaneous tissue, unspecified: Secondary | ICD-10-CM

## 2021-06-28 DIAGNOSIS — R21 Rash and other nonspecific skin eruption: Secondary | ICD-10-CM

## 2021-06-28 DIAGNOSIS — S91302S Unspecified open wound, left foot, sequela: Secondary | ICD-10-CM

## 2021-06-30 ENCOUNTER — Encounter (INDEPENDENT_AMBULATORY_CARE_PROVIDER_SITE_OTHER): Payer: Self-pay | Admitting: Physician Assistant

## 2021-06-30 ENCOUNTER — Telehealth (INDEPENDENT_AMBULATORY_CARE_PROVIDER_SITE_OTHER): Payer: Self-pay | Admitting: Physician Assistant

## 2021-06-30 DIAGNOSIS — L989 Disorder of the skin and subcutaneous tissue, unspecified: Secondary | ICD-10-CM | POA: Insufficient documentation

## 2021-06-30 NOTE — Telephone Encounter (Signed)
LPN called daughter to inform of Dermatology referral ,informed that found a Dermatology close to pt's address([facility] [address], [place], [place] [address]), daughter asked if this is the specialist that PCP is recommending for pt's Hx Squamous cell carcinoma of head and neck ,LPN inform that PCP did not recommend  this specific Dermatologist but will consult and ask for PCP recommendations. Daughter stated that she will look for a Dermatologist and will take care of scheduling an appt;LPN advise daughter once she schedule this appt to share the specialist information for pt's PCP to follow up.Daughter voiced understanding,appreciated the info and has no further questions at the moment.

## 2021-06-30 NOTE — Assessment & Plan Note (Signed)
A/P: Continue wound care and avoiding ambulation.  Followed by podiatry.

## 2021-06-30 NOTE — Progress Notes (Signed)
Date: 06/28/2021    Patient Name: Dillon Wood, Dillon Wood    Patient was seen in their home (POS 12) in lieu of an office visit for the following reason:   Requires use of an assistive device in order to ambulate due to poor balance.    LEGAL DOCUMENTS and CODE STATUS:   Advance Directive Received: No     Type of Document Received:   needs new forms as moved from [place]        Code Status: FULL    Chief Complaint   Patient presents with    skin complaint        HPI:   History and ROS obtained from patient, wife, daughters on phone    Dillon Wood is a 88 y.o. year old male seen today in follow up.     Problem   Skin Lesions    Nonspecific but likely AC or early BCC/SCC with some excoriations.       Open Wound of Plantar Aspect of Foot, Left, Sequela    Patient with h/o vascular insufficiency s/p repair 11/2020 and had a similar left foot plantar MTP wound at that time. Patient walks on the front of his feet, "like he's tip-toeing" per daughter.  Podiatry follows and patient has a follow up appt tomorrow.  Wearing post-op shoe and no longer ambulating secondary to this.         HPI    Past Medical History:   Diagnosis Date    Closed fracture of left side of maxilla 04/03/2017    Facial contusion, initial encounter 04/03/2017    Gastroesophageal reflux disease     Hyperlipemia     Left orbit fracture, closed, initial encounter 04/03/2017     Past Surgical History:   Procedure Laterality Date    ABDOMINAL SURGERY      Hernia repair     Family History   Problem Relation Age of Onset    Heart disease Father     Stroke Father     Diabetes Maternal Aunt      Social History     Tobacco Use    Smoking status: Never     Passive exposure: Never    Smokeless tobacco: Never   Vaping Use    Vaping Use: Never used   Substance Use Topics    Alcohol use: Yes     Comment: 3 per week    Drug use: No     Allergies   Allergen Reactions    Penicillins      Disorientation       MEDICATIONS:     Current Outpatient Medications:     apixaban  (ELIQUIS) 5 MG, Take 1 tablet (5 mg) by mouth every 12 (twelve) hours, Disp: , Rfl:     aspirin EC 81 MG EC tablet, Take 1 tablet (81 mg total) by mouth daily, Disp: 30 tablet, Rfl: 1    atorvastatin (LIPITOR) 40 MG tablet, Take 1 tablet (40 mg total) by mouth daily, Disp: 90 tablet, Rfl: 3    tamsulosin (FLOMAX) 0.4 MG Cap, Take 1 capsule (0.4 mg) by mouth Daily after dinner, Disp: , Rfl:     Wound Dressings (Foam Dressing Circular Border) Pads, Apply 1 each topically daily, Disp: 30 each, Rfl: 5    ROS:   Review of Systems   Constitutional:  Negative for chills and fever.   Respiratory:  Negative for cough, choking and shortness of breath.    Cardiovascular:  Negative for  palpitations and leg swelling.   Gastrointestinal:  Negative for constipation and diarrhea.   Musculoskeletal:  Positive for gait problem (ambulates with cane).   Skin:  Positive for rash and wound.   Neurological:  Negative for headaches.   Psychiatric/Behavioral:  Negative for behavioral problems.      All other systems pertinent to the chief complaint reviewed and otherwise negative     PHYSICAL EXAM:   BP 137/68   Pulse 69   SpO2 98%    Wt Readings from Last 1 Encounters:   04/25/21 72.6 kg (160 lb)      Ht Readings from Last 1 Encounters:   04/25/21 1.778 m (5\' 10" )     Physical Exam  Constitutional:       General: He is not in acute distress.     Appearance: He is not ill-appearing, toxic-appearing or diaphoretic.      Comments: Conversant and clear   Eyes:      General: No scleral icterus.     Extraocular Movements: Extraocular movements intact.   Neck:      Vascular: No carotid bruit.   Cardiovascular:      Rate and Rhythm: Normal rate and regular rhythm.      Heart sounds: No murmur heard.  Pulmonary:      Effort: No respiratory distress.      Breath sounds: No wheezing, rhonchi or rales.   Abdominal:      General: There is no distension.      Tenderness: There is no abdominal tenderness. There is no guarding or rebound.    Musculoskeletal:      Cervical back: No rigidity or tenderness.      Right lower leg: No edema.      Left lower leg: No edema.      Comments: Cannot palpate L DP but foot warm and pink.  Scar at heel L from previous wound   Lymphadenopathy:      Cervical: No cervical adenopathy.   Skin:     Comments: Small raised rough 0.3cm lesions right superior lateral neck with some scabs x approx 4   Neurological:      General: No focal deficit present.      Mental Status: He is oriented to person, place, and time.      Comments: 3/3 word recall   Psychiatric:         Mood and Affect: Mood normal.         Behavior: Behavior normal.     DIAGNOSTICS:     Lab Results   Component Value Date    WBC 7.67 04/02/2017    HGB 12.8 (L) 04/02/2017    HCT 37.0 (L) 04/02/2017    PLT 136 (L) 04/02/2017    NA 140 04/02/2017    K 3.5 04/02/2017    CL 108 04/02/2017    CREAT 1.2 04/02/2017    BUN 14 04/02/2017    CO2 23 04/02/2017    INR 1.1 04/02/2017    GLU 96 04/02/2017     No results found.    ASSESSMENT and PLAN:   Skin lesions  A/P: Refer for dermatology eval    Open wound of plantar aspect of foot, left, sequela  A/P: Continue wound care and avoiding ambulation.  Followed by podiatry.      Orders Placed This Encounter   Procedures    Referral to Dermatology - EXTERNAL     Referral Priority:   Routine     Referral  Type:   Consultation     Referral Reason:   Specialty Services Required     Requested Specialty:   Dermatology     Number of Visits Requested:   1         Electronically Signed by Hilma Favors, PA

## 2021-06-30 NOTE — Telephone Encounter (Signed)
-----   Message from Hilma Favors, [place] sent at 06/28/2021  4:28 PM EST -----  Dermatology referral ordered.  Please fax to local [facility] dermatologist and assist daughter [name] with scheduling

## 2021-06-30 NOTE — Assessment & Plan Note (Signed)
A/P: Refer for dermatology eval

## 2021-06-30 NOTE — Telephone Encounter (Signed)
-----   Message from Stephanie A Shepard, PA sent at 06/28/2021  4:28 PM EST -----  Dermatology referral ordered.  Please fax to local [place] dermatologist and assist daughter heather with scheduling

## 2021-07-12 ENCOUNTER — Encounter (INDEPENDENT_AMBULATORY_CARE_PROVIDER_SITE_OTHER): Payer: Self-pay | Admitting: Physician Assistant

## 2021-07-18 ENCOUNTER — Encounter (INDEPENDENT_AMBULATORY_CARE_PROVIDER_SITE_OTHER): Payer: Self-pay | Admitting: Physician Assistant

## 2021-07-19 ENCOUNTER — Encounter (INDEPENDENT_AMBULATORY_CARE_PROVIDER_SITE_OTHER): Payer: Self-pay | Admitting: Physician Assistant

## 2021-07-20 ENCOUNTER — Encounter (INDEPENDENT_AMBULATORY_CARE_PROVIDER_SITE_OTHER): Payer: Self-pay | Admitting: Physician Assistant

## 2021-08-03 ENCOUNTER — Encounter (INDEPENDENT_AMBULATORY_CARE_PROVIDER_SITE_OTHER): Payer: Self-pay | Admitting: Physician Assistant

## 2021-08-04 ENCOUNTER — Other Ambulatory Visit: Payer: Self-pay

## 2021-08-04 ENCOUNTER — Telehealth (INDEPENDENT_AMBULATORY_CARE_PROVIDER_SITE_OTHER): Payer: Self-pay | Admitting: Physician Assistant

## 2021-08-04 ENCOUNTER — Other Ambulatory Visit (FREE_STANDING_LABORATORY_FACILITY): Payer: Medicare Other

## 2021-08-04 DIAGNOSIS — N3000 Acute cystitis without hematuria: Secondary | ICD-10-CM

## 2021-08-04 LAB — URINALYSIS REFLEX TO MICROSCOPIC EXAM - REFLEX TO CULTURE
Bilirubin, UA: NEGATIVE
Glucose, UA: NEGATIVE
Ketones UA: NEGATIVE
Nitrite, UA: NEGATIVE
Specific Gravity UA: 1.02 (ref 1.001–1.035)
Urine pH: 5.5 (ref 5.0–8.0)
Urobilinogen, UA: NORMAL mg/dL

## 2021-08-04 NOTE — Telephone Encounter (Signed)
Pt's daughter called in to report pt c/o confusion, daughter said pt seemed aggressive ,daughter suspect of recurrent UTI ,pt unable to express any other related symptoms.Daughter is requesting a visit from PCP.  LPN asked daughter if able to collect urine to send it out , daughter said patient is going to an appt to the [place] this morning and can drop a specimen at any [facility] location close to [facility]; also patient receives services from [facility], please advise.    [name] # [phone].

## 2021-08-04 NOTE — Telephone Encounter (Signed)
Spoke with daughter, [name],  on the phone, reports a change of condition with her father. He was acting strange with confusion. He has chronic  incomplete bladder emptying with hx of UTI.  Her son is taking him to [facility] lab for urine testing. Order placed for U/A with reflex.

## 2021-08-07 ENCOUNTER — Telehealth (INDEPENDENT_AMBULATORY_CARE_PROVIDER_SITE_OTHER): Payer: Self-pay | Admitting: Physician Assistant

## 2021-08-07 NOTE — Telephone Encounter (Signed)
-----   Message from Samuella Bruin, NP sent at 08/07/2021  7:40 AM EST -----  Regarding: Urine testing  Pls call to relay Urine testing is negative. Advise to increase fluid intake if able.    Thank you.      ----- Message -----  From: Leory Plowman, [facility] [place]  Sent: 08/04/2021   5:52 PM EST  To: Samuella Bruin, NP

## 2021-08-07 NOTE — Telephone Encounter (Signed)
Spoke with son informing him of NP message that pt urine test result negative and increase fluids if able to. Son voiced understanding and will inform pt and ALF staff.

## 2021-08-09 ENCOUNTER — Encounter (INDEPENDENT_AMBULATORY_CARE_PROVIDER_SITE_OTHER): Payer: Self-pay | Admitting: Physician Assistant

## 2021-08-10 ENCOUNTER — Encounter (INDEPENDENT_AMBULATORY_CARE_PROVIDER_SITE_OTHER): Payer: Self-pay | Admitting: Physician Assistant

## 2021-08-17 ENCOUNTER — Telehealth (INDEPENDENT_AMBULATORY_CARE_PROVIDER_SITE_OTHER): Payer: Self-pay | Admitting: Physician Assistant

## 2021-08-17 DIAGNOSIS — N390 Urinary tract infection, site not specified: Secondary | ICD-10-CM

## 2021-08-17 NOTE — Telephone Encounter (Signed)
Spoke with daughter and son of patient, unable to get in touch with [name] - left voice mail.  Will see patient tomorrow.

## 2021-08-17 NOTE — Telephone Encounter (Signed)
Discussed with daughter.  Patient somewhat more confused than previous, confusing times.      Daughter also concerned about surgery that has been discussed.      Requesting visit.  Will forward to PCP

## 2021-08-17 NOTE — Telephone Encounter (Signed)
Pt dtr [name] called [facility] office reports that that pt dementia has gotten worse; certain concepts pt isn't able to put together that pt generally can like appointments; advs pt reading clock wrong; [name] reports received call from pt spouse Meriam Sprague at 0100 with pt yelling in background to get dressed believed his son was headed to pick him up for his appt; [name] was able to reorient pt to recognize time of day.     [name] is requesting to have PCP [place] care for pt has concerns that pt isn't received care and attention needed with current PCP and would like 2nd opinion on pt. [name] advs never received follow up on Urine test; this lpn educate that RN [facility] spk with her sibling and advs test negative and pt should increase fluids if he could. [name] has concerns that there was no follow up after test to check on pt behavior and no follow up on pt wound on foot. Request to spk with PCP [place].     [name] [phone]

## 2021-08-18 ENCOUNTER — Ambulatory Visit: Payer: Medicare Other | Admitting: Physician Assistant

## 2021-08-18 ENCOUNTER — Encounter (INDEPENDENT_AMBULATORY_CARE_PROVIDER_SITE_OTHER): Payer: Self-pay | Admitting: Physician Assistant

## 2021-08-18 DIAGNOSIS — C760 Malignant neoplasm of head, face and neck: Secondary | ICD-10-CM

## 2021-08-18 DIAGNOSIS — R413 Other amnesia: Secondary | ICD-10-CM

## 2021-08-18 DIAGNOSIS — S91302S Unspecified open wound, left foot, sequela: Secondary | ICD-10-CM

## 2021-08-18 MED ORDER — LORAZEPAM 0.5 MG PO TABS
0.5000 mg | ORAL_TABLET | Freq: Four times a day (QID) | ORAL | 0 refills | Status: DC | PRN
Start: 2021-08-18 — End: 2022-02-14

## 2021-08-18 NOTE — Assessment & Plan Note (Signed)
A/P: Follow up with wound MD Dr. Gerre Pebbles next week for recheck.  Discussed that the foam wound cast should be discussed and families desire to avoid surgery also discussed.  Will continue to monitor and support.

## 2021-08-18 NOTE — Progress Notes (Signed)
Date: 08/18/2021    Patient Name: Dillon Wood    Patient was seen in their home (POS 12) in lieu of an office visit for the following reason:   Requires use of an assistive device in order to ambulate due to poor balance.    LEGAL DOCUMENTS and CODE STATUS:   Advance Directive Received: No     Type of Document Received:   needs new forms as moved from [place]        Code Status: FULL    No chief complaint on file.       HPI:   History and ROS obtained from patient, wife, daughters on phone    Dillon Wood is a 88 y.o. year old male seen today in follow up.     Problem   Open Wound of Plantar Aspect of Foot, Left, Sequela    Patient with h/o vascular insufficiency s/p repair 11/2020 and had a similar left foot plantar MTP wound at that time. Patient walks on the front of his feet, "like he's tip-toeing" per daughter.  Long discussion with daughters regarding surgical recommendation from wound physician which they do not want to proceed with at this time.  Felt uncomfortable with the MD they were referred to and pressure to do surgery without knowing patient's history.  Patient had success in healing with a foam cast for several weeks in the past and they are hopeful to have some success with similar treatment this time.     Squamous Cell Carcinoma of Head and Neck    Was very significant "down to the skull" 3 inch incision exposing the skull. Also had a significant lesion on the forearm that was removed.    S/P radiation and concern from dermatology that it may metastasize into skull.      Memory Loss    Worsened over past few months.  Episodes of agitation.  Concern expressed from daughters that [facility] could have metastasized to brain. Patient failed MMSE today. Unable to remember words or spell world backwards Did not attempt clock draw.            HPI    Past Medical History:   Diagnosis Date    Closed fracture of left side of maxilla 04/03/2017    Facial contusion, initial encounter 04/03/2017     Gastroesophageal reflux disease     Hyperlipemia     Left orbit fracture, closed, initial encounter 04/03/2017     Past Surgical History:   Procedure Laterality Date    ABDOMINAL SURGERY      Hernia repair     Family History   Problem Relation Age of Onset    Heart disease Father     Stroke Father     Diabetes Maternal Aunt      Social History     Tobacco Use    Smoking status: Never     Passive exposure: Never    Smokeless tobacco: Never   Vaping Use    Vaping Use: Never used   Substance Use Topics    Alcohol use: Yes     Comment: 3 per week    Drug use: No     Allergies   Allergen Reactions    Penicillins      Disorientation       MEDICATIONS:     Current Outpatient Medications:     apixaban (ELIQUIS) 5 MG, Take 1 tablet (5 mg) by mouth every 12 (twelve) hours, Disp: , Rfl:  aspirin EC 81 MG EC tablet, Take 1 tablet (81 mg total) by mouth daily, Disp: 30 tablet, Rfl: 1    atorvastatin (LIPITOR) 40 MG tablet, Take 1 tablet (40 mg total) by mouth daily, Disp: 90 tablet, Rfl: 3    LORazepam (ATIVAN) 0.5 MG tablet, Take 1 tablet (0.5 mg) by mouth every 6 (six) hours as needed for Anxiety, Disp: 45 tablet, Rfl: 0    tamsulosin (FLOMAX) 0.4 MG Cap, Take 1 capsule (0.4 mg) by mouth Daily after dinner, Disp: , Rfl:     Wound Dressings (Foam Dressing Circular Border) Pads, Apply 1 each topically daily, Disp: 30 each, Rfl: 5    ROS:   Review of Systems   Constitutional:  Negative for chills and fever.   Respiratory:  Negative for cough, choking and shortness of breath.    Cardiovascular:  Negative for palpitations and leg swelling.   Gastrointestinal:  Negative for constipation and diarrhea.   Musculoskeletal:  Positive for gait problem (ambulates with cane).   Skin:  Positive for rash and wound.   Neurological:  Negative for headaches.   Psychiatric/Behavioral:  Negative for behavioral problems.      All other systems pertinent to the chief complaint reviewed and otherwise negative     PHYSICAL EXAM:   There were no  vitals taken for this visit.   Wt Readings from Last 1 Encounters:   04/25/21 72.6 kg (160 lb)      Ht Readings from Last 1 Encounters:   04/25/21 1.778 m (5\' 10" )     Physical Exam  Constitutional:       General: He is not in acute distress.     Appearance: He is not ill-appearing, toxic-appearing or diaphoretic.      Comments: Conversant and clear   Eyes:      General: No scleral icterus.     Extraocular Movements: Extraocular movements intact.   Neck:      Vascular: No carotid bruit.   Cardiovascular:      Rate and Rhythm: Normal rate and regular rhythm.      Heart sounds: No murmur heard.  Pulmonary:      Effort: No respiratory distress.      Breath sounds: No wheezing, rhonchi or rales.   Abdominal:      General: There is no distension.      Tenderness: There is no abdominal tenderness. There is no guarding or rebound.   Musculoskeletal:      Cervical back: No rigidity or tenderness.      Right lower leg: No edema.      Left lower leg: No edema.      Comments: Cannot palpate L DP but foot warm and pink.  Scar at heel L from previous wound   Lymphadenopathy:      Cervical: No cervical adenopathy.   Skin:     Comments: Small raised rough 0.3cm lesions right superior lateral neck with some scabs x approx 4   Neurological:      General: No focal deficit present.      Mental Status: He is oriented to person, place, and time.      Comments: 3/3 word recall   Psychiatric:         Mood and Affect: Mood normal.         Behavior: Behavior normal.     DIAGNOSTICS:     Lab Results   Component Value Date    WBC 7.67 04/02/2017    HGB  12.8 (L) 04/02/2017    HCT 37.0 (L) 04/02/2017    PLT 136 (L) 04/02/2017    NA 140 04/02/2017    K 3.5 04/02/2017    CL 108 04/02/2017    CREAT 1.2 04/02/2017    BUN 14 04/02/2017    CO2 23 04/02/2017    INR 1.1 04/02/2017    GLU 96 04/02/2017     No results found.    ASSESSMENT and PLAN:   Open wound of plantar aspect of foot, left, sequela  A/P: Follow up with wound MD Dr. Gerre Pebbles next week  for recheck.  Discussed that the foam wound cast should be discussed and families desire to avoid surgery also discussed.  Will continue to monitor and support.    Squamous cell carcinoma of head and neck  A/P: Oncology follow up scheduled for 3/10.  Will monitor.     Memory loss  A/P: Pending oncology eval.  Discussed neurology consult and options and will decide this post oncology appt. Rx for ativan PRN.     Medications Ordered This Encounter         Disp Refills Start End    LORazepam (ATIVAN) 0.5 MG tablet 45 tablet 0 08/18/2021 02/14/2022    Take 1 tablet (0.5 mg) by mouth every 6 (six) hours as needed for Anxiety - Oral          No orders of the defined types were placed in this encounter.        Electronically Signed by Hilma Favors, PA

## 2021-08-18 NOTE — Assessment & Plan Note (Addendum)
A/P: Pending oncology eval.  Discussed neurology consult and options and will decide this post oncology appt. Rx for ativan PRN.

## 2021-08-18 NOTE — Assessment & Plan Note (Signed)
A/P: Oncology follow up scheduled for 3/10.  Will monitor.

## 2021-08-22 NOTE — Telephone Encounter (Signed)
Pt number [phone] highlighted in BOLD to reflect 1st contact number to call for pt medical care

## 2021-08-23 ENCOUNTER — Encounter (INDEPENDENT_AMBULATORY_CARE_PROVIDER_SITE_OTHER): Payer: Medicare Other | Admitting: Physician Assistant

## 2021-08-25 ENCOUNTER — Encounter (INDEPENDENT_AMBULATORY_CARE_PROVIDER_SITE_OTHER): Payer: Self-pay | Admitting: Physician Assistant

## 2021-08-25 ENCOUNTER — Telehealth (INDEPENDENT_AMBULATORY_CARE_PROVIDER_SITE_OTHER): Payer: Self-pay | Admitting: Physician Assistant

## 2021-08-25 NOTE — Telephone Encounter (Signed)
Received fax from [facility] stating that Dillon Wood has bruise on his left hand. Mr. Halterman does not remember how he got the bruise. Skin intact. Patient denied pain or discomfort the the area.   Bruise report imported into media.    Call back number [phone]

## 2021-09-20 ENCOUNTER — Ambulatory Visit: Payer: Medicare Other | Attending: Dermatology | Admitting: Dermatology

## 2021-09-20 ENCOUNTER — Other Ambulatory Visit: Payer: Self-pay | Admitting: Student in an Organized Health Care Education/Training Program

## 2021-09-20 VITALS — BP 109/70 | HR 68 | Temp 97.2°F | Resp 16

## 2021-09-20 DIAGNOSIS — C760 Malignant neoplasm of head, face and neck: Secondary | ICD-10-CM

## 2021-09-20 DIAGNOSIS — Z85828 Personal history of other malignant neoplasm of skin: Secondary | ICD-10-CM

## 2021-09-20 DIAGNOSIS — Z1283 Encounter for screening for malignant neoplasm of skin: Secondary | ICD-10-CM

## 2021-09-20 DIAGNOSIS — L570 Actinic keratosis: Secondary | ICD-10-CM

## 2021-09-20 NOTE — Progress Notes (Signed)
Dr. Kandice Moos    I had the pleasure of seeing your patient in our department today for a consult visit. Below, you will find his initial consultation note.      [facility]  [facility]       CC: New patient, History of SCC    HISTORY OF PRESENT ILLNESS  Dillon Wood is 88 y.o. male who comes in accompanied by daughter as a new patient for total body skin examination. 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     Reports he recently moved to the area from [place] and looking to establish care. Reports Dr Kandice Moos is his dermatologist and saw him a month ago. State he only wants lymph node examination and declines full skin check.    He had numerous AKs on his scalp, and states was given topical 5FU and calicpotriene to use to his face/scalp.     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 s/p Mohs with Dr Rosey Bath, 5/21 in [place]    Reports history multiple NMSC's over the years    Current Outpatient Medications   Medication Sig Dispense Refill    apixaban (ELIQUIS) 5 MG Take 1 tablet (5 mg) by mouth every 12 (twelve) hours      aspirin EC 81 MG EC tablet Take 1 tablet (81 mg total) by mouth daily 30 tablet 1    Aspirin Low Dose 81 MG chewable tablet       atorvastatin (LIPITOR) 40 MG tablet Take 1 tablet (40 mg total) by mouth daily 90 tablet 3    cefadroxil (DURICEF) 500 MG capsule Take 500 mg by mouth 2 (two) times daily      clindamycin (CLEOCIN) 300 MG capsule TAKE 1 CAPSULE (300 MG TOTAL) BY MOUTH FOUR TIMES DAILY TAKE WITH A FULL GLASS OF WATER.      clopidogrel (PLAVIX) 75 mg tablet Take 75 mg by mouth daily      doxycycline  (ADOXA) 100 MG tablet Take 100 mg by mouth 2 (two) times daily      doxycycline (MONODOX) 100 MG capsule TAKE 1 CAPSULE BY MOUTH TWICE DAILY FOR 5 DAYS.      finasteride (PROSCAR) 5 MG tablet TAKE 1 TABLET BY MOUTH EVERYDAY AT BEDTIME      fluorouracil (EFUDEX) 5 % cream       HYDROcodone-acetaminophen (NORCO) 5-325 MG per tablet PLEASE SEE ATTACHED FOR DETAILED DIRECTIONS      imiquimod (ALDARA) 5 % cream APPLY TO BIOPSY SITE ON MID-MANDIBLE 5 TIMES A WEEK FOR 6 WEEKS      levoFLOXacin (LEVAQUIN) 500 MG tablet Take 500 mg by mouth daily      levoFLOXacin (LEVAQUIN) 750 MG tablet Take 750 mg by mouth every other day      LORazepam (ATIVAN) 0.5 MG tablet Take 1 tablet (0.5 mg) by mouth every 6 (six) hours as needed for Anxiety 45 tablet 0    phenazopyridine (PYRIDIUM) 100 MG tablet TAKE 1 TABLET BY MOUTH 3 TIMES A DAY BEFORE MEALS AS NEEDED FOR DYSURIA (BLADDER PAIN)      tamsulosin (FLOMAX) 0.4 MG Cap Take 1 capsule (0.4 mg) by mouth  Daily after dinner      Wound Dressings (Foam Dressing Circular Border) Pads Apply 1 each topically daily 30 each 5    amoxicillin-clavulanate (AUGMENTIN) 875-125 MG per tablet TAKE 1 TABLET BY MOUTH IN THE MORNING AND 1 TABLET BEFORE BEDTIME. DO ALL THIS FOR 10 DAYS. (Patient not taking: Reported on 09/20/2021)      ampicillin (PRINCIPEN) 500 MG capsule TAKE 1 CAPSULE BY MOUTH EVERY 8 HOURS (Patient not taking: Reported on 09/20/2021)       No current facility-administered medications for this visit.     Allergies   Allergen Reactions    Penicillins      Disorientation     Past Medical History:   Diagnosis Date    Closed fracture of left side of maxilla 04/03/2017    Facial contusion, initial encounter 04/03/2017    Gastroesophageal reflux disease     Hyperlipemia     Left orbit fracture, closed, initial encounter 04/03/2017     Past Surgical History:   Procedure Laterality Date    ABDOMINAL SURGERY      Hernia repair     Family History   Problem Relation Age of Onset    Heart disease  Father     Stroke Father     Diabetes Maternal Aunt      Social History     Socioeconomic History    Marital status: Married    Number of children: 4   Occupational History    Occupation: Surveyor, quantity of schools   Tobacco Use    Smoking status: Never     Passive exposure: Never    Smokeless tobacco: Never   Vaping Use    Vaping status: Never Used   Substance and Sexual Activity    Alcohol use: Yes     Comment: 3 per week    Drug use: No       REVIEW OF SYSTEMS  Const - no fever no chills   Resp- no cough   Neuro - no headache   Abd - no pain   Integ - see HPI     All other systems reviewed and negative    Objective:     Vitals:    09/20/21 1334   BP: 109/70   Pulse: 68   Resp: 16   Temp: 97.2 F (36.2 C)   SpO2: 96%        Constitutional: General appearance: NAD, conversant      NEURO/PSYCH: alert and oriented, pleasant    HEENT: normocephalic, atraumatic. Conjunctiva, lids, lips, teeth, gums and oropharynx examined with no suspicious lesions      SKIN: A modified skin examination is completed including:   - palpation of scalp and inspection of hair of scalp, eyebrows, face  - inspection and/or palpation of the skin and subcutaneous tissues with the following anatomic skin sites examined with the following findings:     Head including face -   left posterior vertex scalp 6 cm well healed scar   right vertex is curvilinear scar 6 cm left temple graft site.   extensive actinic damage and keratosis scalp forehead ears.   Neck - no lesions of concern      Examination of lymph node basins is completed, including the H and N, axilla . There is no lymphadenopathy.    - Dermatoscopy was used to evaluate lesions  - General skin:, Extensive photodamage and rhytids      Pathology reports  from care everywhere  Final Diagnosis  A. Skin, biopsy, left vertex scalp:  -  SQUAMOUS CELL CARCINOMA, WELL DIFFERENTIATED      B. Skin, biopsy, left forearm:  -  SQUAMOUS CELL CARCINOMA, WELL DIFFERENTIATED (ACANTHOLYTIC)       Electronically signed by Lanice Schwab, MD on 08/16/2020 at 12:57 PM       Final Diagnosis     A. Skin, Mohs debulk, left vertex scalp:  -  SQUAMOUS CELL CARCINOMA, WELL DIFFERENTIATED     NOTE: The tumor invades to a depth of at least 5.5 mm and is present at the base of the specimen. There is perineural invasion, with maximum nerve caliber 0.09 mm. No lymphovascular invasion is seen.      B. Skin, Mohs stage 1, left vertex scalp:  -  SQUAMOUS CELL CARCINOMA, WELL DIFFERENTIATED     NOTE: No perineural or lymphovascular invasion is seen.       Electronically signed by Lanice Schwab, MD on 09/05/2020 at  4:19 PM       Procedure: Cryotherapy to 20 AK's  Obtained verbal consent to perform cryotherapy after discussing benefits, risk of developing a blister, crust or scab and possible need for multiple treatments, recurrence/ persistence of lesion, risk of scar or keloid, and/ or developing permanent brown or white spots. Liquid nitrogen applied. Patient tolerated the procedure well and was provided wound care instructions.    Assessment & Plan:   1. 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 We referred him to see Dr Dickey Gave on 09/22/21  - continue skin checks every 6 months with Dr Kandice Moos.     2. 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   - patient reports having been prescribed Efudex and been using it sporadically for a month.  Clinically no evidence of any response on the scalp/forehead lesions - he states his nursing home staff is applying it, but it doesn't appear so  - advised patient to stop the application. Also had RN fax a note to his assisted living extablishment  - cryotherapy to 20 lesions as above    4. Focused skin exam neck up  - No lesions suspicious for malignancy  - Sun protection reviewed  - ABCDEs of melanoma reviewed  - Monthly self-skin examination was advised and the patient was instructed to return to clinic prior to scheduled visit if any suspicious or worrisome findings are discovered    Nelva Nay, MSN, FNP-BC  Nurse Practitioner, [facility]       I personally reviewed the patients history, pathology when relevant, and completed an independent skin examination. I agree with all components of this written note. I have made addendums where/if needed.    Lily Lovings, MD, FAAD  Melanoma and Skin Cancer Specialist  [facility]  [facility]   [address]  [place], [place] [address]  T  [phone]  F [phone]             45 mins of time was spent in total preparing to see the patient, reviewing prior notes, performing the full body skin and lymph node examination, using dermoscopy to evaluate skin lesions, ordering medications/tests/procedures (such as skin biopsies or cryotherapy), counseling and educating the patient on sun protection and the importance of self-skin examination,  documenting the clinical information and coordinating future care and follow up.

## 2021-09-22 ENCOUNTER — Telehealth: Payer: Self-pay

## 2021-09-22 ENCOUNTER — Encounter: Payer: Self-pay | Admitting: Student in an Organized Health Care Education/Training Program

## 2021-09-22 ENCOUNTER — Ambulatory Visit
Payer: Medicare Other | Attending: Student in an Organized Health Care Education/Training Program | Admitting: Student in an Organized Health Care Education/Training Program

## 2021-09-22 VITALS — BP 110/70 | HR 71 | Temp 98.3°F | Resp 16 | Ht 70.0 in | Wt 174.0 lb

## 2021-09-22 DIAGNOSIS — C760 Malignant neoplasm of head, face and neck: Secondary | ICD-10-CM | POA: Insufficient documentation

## 2021-09-22 DIAGNOSIS — I6523 Occlusion and stenosis of bilateral carotid arteries: Secondary | ICD-10-CM | POA: Insufficient documentation

## 2021-09-22 NOTE — Telephone Encounter (Addendum)
Met with patient during office visit on 09/22/21.  Per patient, can call daughters [name] and [name] to discuss MRI/MRA appointment.    Called daughter to review upcoming appointment times for 10/12/2021.  Daughter did not pick up; left voicemail and will await call back.     Details are as follows:  Patient is scheduled for MRI Brain and MRA Neck on 10/12/21.  Patient should arrive at 10:15 AM to [address] [place].  Patient should bring ID, insurace card and list of medications on exam day.  No metal on day of exam.

## 2021-09-22 NOTE — Progress Notes (Signed)
[facility]       Patient Name: Dillon Wood   Referring Provider: Hilma Favors, PA    Primary Dx:   1. Squamous cell carcinoma of head and neck           ONCOLOGY CARE TEAM  Mohs Surgery: Rosey Bath  Dermatology: Doran Durand, MD   Derm Onc:  Lily Lovings, MD  Radiation Oncology:  Theadora Rama, MD ([facility])      HISTORY OF PRESENT ILLNESS  Dillon Wood is 88 y.o. 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     Recently moved into assisted living facility with his wife from [place]. He was recently seen by his geriatrics team 2/24, at which time the patient's family member noted worsening memory loss over the preceding months, with episodes of agitation. Family expressed concern that this cancer could have spread to his brain.    Presents today for initial consultation. Accompanied by his grandson with his daughter present by phone. Very pleasant and jovial on our interaction.    Oncology History   Squamous cell carcinoma of head and neck   08/23/2020 Initial Diagnosis    Squamous cell carcinoma of the scalp     09/14/2020 Surgery    Moh's resection (Dr. Rosey Bath, [facility], [place])  Size: 2.5cm  Invasion: 5.103mm, through subQ fat  PNI: + (maximum caliber 0.57mm)     BWH T2b     10/05/2020 - 11/01/2020 Radiation    50 Gy in 20 fractions (Dr. Theadora Rama, [facility], [place])     09/20/2021 Cancer Staged    Staging form: Cutaneous Carcinoma of the Head and Neck, AJCC 8th Edition  - Clinical stage from 09/20/2021: Stage III (cT3, cN0, cM0) - Signed by Algis Greenhouse, MD on 09/20/2021             Allergies   Allergen Reactions    Penicillins      Disorientation     Current Outpatient Medications   Medication  Sig Dispense Refill    amoxicillin-clavulanate (AUGMENTIN) 875-125 MG per tablet TAKE 1 TABLET BY MOUTH IN THE MORNING AND 1 TABLET BEFORE BEDTIME. DO ALL THIS FOR 10 DAYS. (Patient not taking: Reported on 09/20/2021)      ampicillin (PRINCIPEN) 500 MG capsule TAKE 1 CAPSULE BY MOUTH EVERY 8 HOURS (Patient not taking: Reported on 09/20/2021)      apixaban (ELIQUIS) 5 MG Take 1 tablet (5 mg) by mouth every 12 (twelve) hours      aspirin EC 81 MG EC tablet Take 1 tablet (81 mg total) by mouth daily 30 tablet 1    Aspirin Low Dose 81 MG chewable tablet       atorvastatin (LIPITOR) 40 MG tablet Take 1 tablet (40 mg total) by mouth daily 90 tablet 3    cefadroxil (DURICEF) 500 MG capsule Take 500 mg by mouth 2 (two) times daily      clindamycin (CLEOCIN) 300 MG capsule TAKE 1 CAPSULE (300 MG TOTAL) BY MOUTH FOUR TIMES DAILY TAKE WITH A FULL GLASS OF WATER.      clopidogrel (PLAVIX) 75 mg tablet Take 75 mg by mouth daily      doxycycline (ADOXA) 100 MG tablet Take 100  mg by mouth 2 (two) times daily      doxycycline (MONODOX) 100 MG capsule TAKE 1 CAPSULE BY MOUTH TWICE DAILY FOR 5 DAYS.      finasteride (PROSCAR) 5 MG tablet TAKE 1 TABLET BY MOUTH EVERYDAY AT BEDTIME      fluorouracil (EFUDEX) 5 % cream       HYDROcodone-acetaminophen (NORCO) 5-325 MG per tablet PLEASE SEE ATTACHED FOR DETAILED DIRECTIONS      imiquimod (ALDARA) 5 % cream APPLY TO BIOPSY SITE ON MID-MANDIBLE 5 TIMES A WEEK FOR 6 WEEKS      levoFLOXacin (LEVAQUIN) 500 MG tablet Take 500 mg by mouth daily      levoFLOXacin (LEVAQUIN) 750 MG tablet Take 750 mg by mouth every other day      LORazepam (ATIVAN) 0.5 MG tablet Take 1 tablet (0.5 mg) by mouth every 6 (six) hours as needed for Anxiety 45 tablet 0    phenazopyridine (PYRIDIUM) 100 MG tablet TAKE 1 TABLET BY MOUTH 3 TIMES A DAY BEFORE MEALS AS NEEDED FOR DYSURIA (BLADDER PAIN)      tamsulosin (FLOMAX) 0.4 MG Cap Take 1 capsule (0.4 mg) by mouth Daily after dinner      Wound Dressings (Foam Dressing  Circular Border) Pads Apply 1 each topically daily 30 each 5     No current facility-administered medications for this visit.     Past Medical History:   Diagnosis Date    Closed fracture of left side of maxilla 04/03/2017    Facial contusion, initial encounter 04/03/2017    Gastroesophageal reflux disease     Hyperlipemia     Left orbit fracture, closed, initial encounter 04/03/2017     Past Surgical History:   Procedure Laterality Date    ABDOMINAL SURGERY      Hernia repair     Family History   Problem Relation Age of Onset    Heart disease Father     Stroke Father     Diabetes Maternal Aunt      Social History     Socioeconomic History    Marital status: Married    Number of children: 4   Occupational History    Occupation: Surveyor, quantity of schools   Tobacco Use    Smoking status: Never     Passive exposure: Never    Smokeless tobacco: Never   Vaping Use    Vaping status: Never Used   Substance and Sexual Activity    Alcohol use: Yes     Comment: 3 per week    Drug use: No     Review of Systems  Negative except as noted above    Objective:     Vitals:    09/22/21 1136   BP: 110/70   Pulse: 71   Resp: 16   Temp: 98.3 F (36.8 C)   SpO2: 94%       ECOG PS: 1  General: NAD  Eyes: Anicteric, normal conjunctiva  ENT: Poor hearing  CV: RRR  RESP: CTA B  GI: NT/ND, no HSM  Neuro: AAO x 3  MS: no swelling in arms or legs  SKIN:   - left posterior vertex scalp 6 cm well healed scar   - right vertex is curvilinear scar 6 cm left temple graft site.   - extensive actinic damage and keratosis scalp forehead ears  LYMPH: no cervical suplarclavicular axillary lymphadenopathy    LAB DATA:   Lab Results   Component Value Date    WBC  7.67 04/02/2017    HGB 12.8 (L) 04/02/2017    HCT 37.0 (L) 04/02/2017    MCV 90.9 04/02/2017    PLT 136 (L) 04/02/2017     Lab Results   Component Value Date    CREAT 1.2 04/02/2017    BUN 14 04/02/2017    NA 140 04/02/2017    K 3.5 04/02/2017    CL 108 04/02/2017    CO2 23 04/02/2017     No  results found for: ALT, AST, GGT, ALKPHOS, BILITOTAL    RADIOLOGY DATA:   No valid procedures specified.    PATHOLOGY DATA:  Final Diagnosis      A. Skin, biopsy, left vertex scalp:  -  SQUAMOUS CELL CARCINOMA, WELL DIFFERENTIATED      B. Skin, biopsy, left forearm:  -  SQUAMOUS CELL CARCINOMA, WELL DIFFERENTIATED (ACANTHOLYTIC)      Electronically signed by Lanice Schwab, MD on 08/16/2020 at 12:57 PM         Final Diagnosis      A. Skin, Mohs debulk, left vertex scalp:  -  SQUAMOUS CELL CARCINOMA, WELL DIFFERENTIATED     NOTE: The tumor invades to a depth of at least 5.5 mm and is present at the base of the specimen. There is perineural invasion, with maximum nerve caliber 0.09 mm. No lymphovascular invasion is seen.      B. Skin, Mohs stage 1, left vertex scalp:  -  SQUAMOUS CELL CARCINOMA, WELL DIFFERENTIATED     NOTE: No perineural or lymphovascular invasion is seen.          Assessment & Plan:     1. 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 Kandice Moos and Dr. Sherlean Foot (can alternate between them).  - no role for routine cross-sectional imaging given the very low likelihood of distant metastatic spread; will order imaging only to assess specific areas of concern  - I explained that brain metastases are almost never seen with this disease, and certainly not in the absence of other widespread metastatic disease. Local recurrences are common however, and in the worst cases invasion into the skull may be seen, but this should present with pain rather than neurocognitive changes.    2. Worsening memory loss, agitation  - In the setting of a long term neurocognitive decline c/w dementia, which predates the cancer  -  More likely is that he is experiencing stepwise decline in his neurocognitive status, in a pattern more keeping with vascular dementia, which can have these more sudden drops in functioning  - I explained that scalp radiation can sometimes cause radiation scatter to the brain parenchyma itself, which can be symptomatic. Will order MRI brain + MRA neck to investigate. The latter is added to investigate his known carotid stenosis.  - Recent anesthesia experience with his PVD may also have accelerated this decline  - F/U with geriatrics for ongoing support    3. 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 Sherlean Foot  - Derm surveillance as above    RTC in 59mo  Partick Musselman Al-Mondhiry, MD, MA  Medical Oncologist, Cutaneous Malignancies Program  [facility]  [address], [id number]  [place], [place]  Phone: [phone]  Fax: [phone]

## 2021-09-26 ENCOUNTER — Telehealth: Payer: Self-pay

## 2021-09-26 NOTE — Telephone Encounter (Signed)
Daughter, [name] called back.  Reviewed 10/12/2021 appointment date/time/details; daughter verbalized understanding.  No additional needs identified at this time; daughter encouraged to call with any new questions or concerns.

## 2021-09-26 NOTE — Telephone Encounter (Signed)
Called daughter(s) to review appointment for patient for 10/12/2021.  Daughter(s) did not pick up; left voicemail with [name] and will await call back.

## 2021-10-11 ENCOUNTER — Encounter (INDEPENDENT_AMBULATORY_CARE_PROVIDER_SITE_OTHER): Payer: Medicare Other | Admitting: Physician Assistant

## 2021-10-12 ENCOUNTER — Ambulatory Visit: Payer: Medicare Other

## 2021-10-12 ENCOUNTER — Ambulatory Visit: Payer: Medicare Other | Attending: Student in an Organized Health Care Education/Training Program

## 2021-10-12 ENCOUNTER — Telehealth: Payer: Self-pay

## 2021-10-12 DIAGNOSIS — I6523 Occlusion and stenosis of bilateral carotid arteries: Secondary | ICD-10-CM

## 2021-10-12 DIAGNOSIS — C760 Malignant neoplasm of head, face and neck: Secondary | ICD-10-CM

## 2021-10-12 MED ORDER — GADOTERATE MEGLUMINE 7.5 MMOL/15ML IV SOLN (CLARISCAN)
15.0000 mL | Freq: Once | INTRAVENOUS | Status: AC | PRN
Start: 2021-10-12 — End: 2021-10-12
  Administered 2021-10-12: 15 mL via INTRAVENOUS

## 2021-10-12 NOTE — Telephone Encounter (Signed)
Patients daughter, [name], called to confirm location for MRI's scheduled 10/12/21. Provided addres: [id number] [id number] [phone] and direction instructions:  - Please park in the garage.Building is labeled [id number].   - Please use the Main Entrance then go straight down the hallways to [address] on your left.    [name] verbalized understanding and had no further questions at this time.

## 2021-10-17 ENCOUNTER — Ambulatory Visit: Payer: Medicare Other | Admitting: Physician Assistant

## 2021-10-17 VITALS — BP 117/69 | HR 73

## 2021-10-17 DIAGNOSIS — S91302S Unspecified open wound, left foot, sequela: Secondary | ICD-10-CM

## 2021-10-17 DIAGNOSIS — I739 Peripheral vascular disease, unspecified: Secondary | ICD-10-CM

## 2021-10-17 DIAGNOSIS — R296 Repeated falls: Secondary | ICD-10-CM

## 2021-10-18 ENCOUNTER — Encounter (INDEPENDENT_AMBULATORY_CARE_PROVIDER_SITE_OTHER): Payer: Self-pay | Admitting: Physician Assistant

## 2021-10-18 NOTE — Progress Notes (Signed)
Subjective     Patient was seen in their home (POS 12) in lieu of an office visit for the following reason:   Requires use of an assistive device in order to ambulate due to poor balance.    HPI:   Patient is seen today in the presence of his wife in follow up.    Review of Systems   Constitutional:  Negative for chills, fever and malaise/fatigue.   Respiratory:  Negative for cough and wheezing.    Cardiovascular:  Negative for chest pain and leg swelling.   Musculoskeletal:  Negative for falls.            Objective   Past Medical History:   Diagnosis Date   . Closed fracture of left side of maxilla 04/03/2017   . DVT (deep venous thrombosis)    . Facial contusion, initial encounter 04/03/2017   . Gastroesophageal reflux disease    . Hyperlipemia    . Left orbit fracture, closed, initial encounter 04/03/2017   . PVD (peripheral vascular disease)      Past Surgical History:   Procedure Laterality Date   . ABDOMINAL SURGERY      Hernia repair     Family History   Problem Relation Age of Onset   . Heart disease Father    . Stroke Father    . Diabetes Maternal Aunt      Social History     Tobacco Use   . Smoking status: Never     Passive exposure: Never   . Smokeless tobacco: Never   Vaping Use   . Vaping status: Never Used   Substance Use Topics   . Alcohol use: Yes     Comment: 3 per week   . Drug use: No     Allergies   Allergen Reactions   . Penicillins      Disorientation       MEDICATIONS:     Current Outpatient Medications:   .  ampicillin (PRINCIPEN) 500 MG capsule, , Disp: , Rfl:   .  apixaban (ELIQUIS) 5 MG, Take 1 tablet (5 mg) by mouth every 12 (twelve) hours, Disp: , Rfl:   .  aspirin EC 81 MG EC tablet, Take 1 tablet (81 mg total) by mouth daily, Disp: 30 tablet, Rfl: 1  .  Aspirin Low Dose 81 MG chewable tablet, , Disp: , Rfl:   .  atorvastatin (LIPITOR) 40 MG tablet, Take 1 tablet (40 mg total) by mouth daily, Disp: 90 tablet, Rfl: 3  .  clopidogrel (PLAVIX) 75 mg tablet, Take 75 mg by mouth daily, Disp:  , Rfl:   .  doxycycline (MONODOX) 100 MG capsule, , Disp: , Rfl:   .  finasteride (PROSCAR) 5 MG tablet, , Disp: , Rfl:   .  fluorouracil (EFUDEX) 5 % cream, , Disp: , Rfl:   .  imiquimod (ALDARA) 5 % cream, , Disp: , Rfl:   .  levoFLOXacin (LEVAQUIN) 750 MG tablet, Take 750 mg by mouth every other day, Disp: , Rfl:   .  LORazepam (ATIVAN) 0.5 MG tablet, Take 1 tablet (0.5 mg) by mouth every 6 (six) hours as needed for Anxiety, Disp: 45 tablet, Rfl: 0  .  phenazopyridine (PYRIDIUM) 100 MG tablet, , Disp: , Rfl:   .  tamsulosin (FLOMAX) 0.4 MG Cap, Take 1 capsule (0.4 mg) by mouth Daily after dinner, Disp: , Rfl:   .  Wound Dressings (Foam Dressing Circular Border) Pads, Apply 1 each  topically daily, Disp: 30 each, Rfl: 5    PHYSICAL EXAM:   BP 117/69   Pulse 73   SpO2 98%    Wt Readings from Last 1 Encounters:   09/22/21 78.9 kg (174 lb)      Ht Readings from Last 1 Encounters:   09/22/21 1.778 m (5\' 10" )     Physical Exam  Constitutional:       General: He is not in acute distress.     Appearance: Normal appearance.   HENT:      Head: Normocephalic and atraumatic.      Right Ear: External ear normal.      Left Ear: External ear normal.      Nose: Nose normal.      Mouth/Throat:      Mouth: Mucous membranes are dry.   Eyes:      Extraocular Movements: Extraocular movements intact.      Conjunctiva/sclera: Conjunctivae normal.   Cardiovascular:      Rate and Rhythm: Normal rate and regular rhythm.      Pulses: Normal pulses.      Heart sounds: Normal heart sounds. No murmur heard.  Pulmonary:      Effort: Pulmonary effort is normal. No respiratory distress.      Breath sounds: Normal breath sounds. No wheezing.   Abdominal:      General: Bowel sounds are normal. There is no distension.      Palpations: Abdomen is soft.   Musculoskeletal:         General: No swelling. Normal range of motion.      Cervical back: Neck supple.   Skin:     General: Skin is warm.      Capillary Refill: Capillary refill takes less than 2  seconds.      Coloration: Skin is not jaundiced.   Neurological:      General: No focal deficit present.      Mental Status: He is alert.   Psychiatric:         Mood and Affect: Mood normal.     DIAGNOSTICS:     Lab Results   Component Value Date    WBC 7.67 04/02/2017    HGB 12.8 (L) 04/02/2017    HCT 37.0 (L) 04/02/2017    PLT 136 (L) 04/02/2017    NA 140 04/02/2017    K 3.5 04/02/2017    CL 108 04/02/2017    CREAT 1.2 04/02/2017    BUN 14 04/02/2017    CO2 23 04/02/2017    INR 1.1 04/02/2017    GLU 96 04/02/2017     MRI brain with and without contrast    Result Date: 10/12/2021    1. No acute intracranial process. 2. No mass, hydrocephalus, or pathologic fluid collection. 3. No acute infarct. 4. Chronic ischemic changes and age-appropriate volume are present. 5. There is a 1.1 cm lesion in the right parotid gland deep lobe. Its imaging characteristics suggest a benign cyst however the lesion is nonspecific. Trilby Drummer, MD 10/12/2021 1:08 PM    MRA Neck W WO Contrast    Result Date: 10/12/2021  1.Stable MRA neck. 2.Again present is a greater than 90% stenosis of the right carotid bulb by NASCET criteria. Extensive atherosclerotic disease throughout the remainder of the cervical right ICA is noted with multifocal moderate grade stenoses. 3.There is 62% stenosis of the proximal left internal carotid artery based on NASCET criteria. 4.The vertebral arteries remain patent. Trilby Drummer, MD 10/12/2021 1:08  PM          Plan   ASSESSMENT and PLAN:       Shon was seen today for wound check.    Diagnoses and all orders for this visit:    PVD (peripheral vascular disease)   - History of repair in the past.  Now being followed by vascular as to whether have a repeat procedure.  Now has wound left plantar foot.     Frequent falls   - None recently.  Using scooter to get around.    Open wound of plantar aspect of foot, left, sequela   - Newly reopened.  Followed closely by wound MD who has a repeat appt at the end of this week  before patient goes on trip to [place].  Using post op shoe and scooter to avoid pressure.

## 2021-10-22 ENCOUNTER — Emergency Department: Payer: Medicare Other

## 2021-10-22 ENCOUNTER — Emergency Department
Admission: EM | Admit: 2021-10-22 | Discharge: 2021-10-23 | Disposition: A | Discharge status: Home / Self Care | Payer: Medicare Other | Attending: Emergency Medicine | Admitting: Emergency Medicine

## 2021-10-22 DIAGNOSIS — W19XXXA Unspecified fall, initial encounter: Secondary | ICD-10-CM

## 2021-10-22 DIAGNOSIS — G8929 Other chronic pain: Secondary | ICD-10-CM | POA: Insufficient documentation

## 2021-10-22 DIAGNOSIS — W052XXA Fall from non-moving motorized mobility scooter, initial encounter: Secondary | ICD-10-CM | POA: Insufficient documentation

## 2021-10-22 DIAGNOSIS — M79672 Pain in left foot: Secondary | ICD-10-CM | POA: Insufficient documentation

## 2021-10-22 DIAGNOSIS — S76012A Strain of muscle, fascia and tendon of left hip, initial encounter: Secondary | ICD-10-CM

## 2021-10-22 DIAGNOSIS — S76011A Strain of muscle, fascia and tendon of right hip, initial encounter: Secondary | ICD-10-CM | POA: Insufficient documentation

## 2021-10-22 NOTE — ED Triage Notes (Signed)
Dillon Wood is a 88 y.o. male BIBa from assisted living facility had a fall earlier this morning did hit his head but did notlose consciousness. Did fall on his right hip where he is complaining of 8/10 pain. Pt denies any other pain would like to get his hip checked out. A&Ox4 and is on blood thinners.

## 2021-10-22 NOTE — ED Notes (Signed)
Bed: BT59  Expected date:   Expected time:   Means of arrival:   Comments:  Medic 204

## 2021-10-23 ENCOUNTER — Encounter (INDEPENDENT_AMBULATORY_CARE_PROVIDER_SITE_OTHER): Payer: Self-pay | Admitting: Physician Assistant

## 2021-10-23 LAB — REFERRAL TO DISPATCH HEALTH - ED TO HOME: DISPATCHHEALTH: 20230501061125

## 2021-10-23 MED ORDER — ACETAMINOPHEN 500 MG PO TABS
1000.0000 mg | ORAL_TABLET | Freq: Once | ORAL | Status: AC
Start: 2021-10-23 — End: 2021-10-23
  Administered 2021-10-23: 1000 mg via ORAL
  Filled 2021-10-23: qty 2

## 2021-10-23 MED ORDER — TRAMADOL HCL 50 MG PO TABS
50.0000 mg | ORAL_TABLET | Freq: Four times a day (QID) | ORAL | Status: DC | PRN
Start: 2021-10-23 — End: 2021-10-23
  Administered 2021-10-23: 50 mg via ORAL
  Filled 2021-10-23: qty 1

## 2021-10-23 MED ORDER — NALOXONE HCL 2 MG/0.4ML IJ SOAJ
INTRAMUSCULAR | 0 refills | Status: DC
Start: 2021-10-23 — End: 2022-07-20

## 2021-10-23 MED ORDER — TRAMADOL HCL 50 MG PO TABS
50.0000 mg | ORAL_TABLET | Freq: Four times a day (QID) | ORAL | 0 refills | Status: DC | PRN
Start: 2021-10-23 — End: 2022-07-20

## 2021-10-23 NOTE — ED Notes (Signed)
VSS, NAD, Pt given DCI and prescriptions, knows to fill medications and take as directed, understands to follow up with PCP and referred MD.  Hospital care, health status, disposition, and follow up were explained.  All questions answered and understood, pt demonstrated knowledge about health status, walked out of ED with steady gait.

## 2021-10-26 NOTE — ED Provider Notes (Signed)
EMERGENCY DEPARTMENT HISTORY AND PHYSICAL EXAM     None        Date: 10/22/2021  Patient Name: Dillon Wood    History of Presenting Illness and Plan     Chief Complaint   Patient presents with    Fall       History Provided By: Patient and Patient's Wife    HPI:     HPI documented within ED course below.       Associated symptoms and pertinent negative listed in ROS.    PCP: Hilma Favors, PA  SPECIALISTS:    No current facility-administered medications for this encounter.     Current Outpatient Medications   Medication Sig Dispense Refill    ampicillin (PRINCIPEN) 500 MG capsule       apixaban (ELIQUIS) 5 MG Take 1 tablet (5 mg) by mouth every 12 (twelve) hours      aspirin EC 81 MG EC tablet Take 1 tablet (81 mg total) by mouth daily 30 tablet 1    Aspirin Low Dose 81 MG chewable tablet       atorvastatin (LIPITOR) 40 MG tablet Take 1 tablet (40 mg total) by mouth daily 90 tablet 3    clopidogrel (PLAVIX) 75 mg tablet Take 75 mg by mouth daily      doxycycline (MONODOX) 100 MG capsule       finasteride (PROSCAR) 5 MG tablet       fluorouracil (EFUDEX) 5 % cream       imiquimod (ALDARA) 5 % cream       levoFLOXacin (LEVAQUIN) 750 MG tablet Take 750 mg by mouth every other day      LORazepam (ATIVAN) 0.5 MG tablet Take 1 tablet (0.5 mg) by mouth every 6 (six) hours as needed for Anxiety 45 tablet 0    naloxone (EVZIO) 2 MG/0.4ML Solution Auto-injector injection Use one auto-injector upon signs of opioid overdose. Call 911. May repeat x 1. 2 each 0    phenazopyridine (PYRIDIUM) 100 MG tablet       tamsulosin (FLOMAX) 0.4 MG Cap Take 1 capsule (0.4 mg) by mouth Daily after dinner      traMADol (ULTRAM) 50 MG tablet Take 1 tablet (50 mg) by mouth every 6 (six) hours as needed for Pain Do not drive or operate machinery while taking this medication 12 tablet 0    Wound Dressings (Foam Dressing Circular Border) Pads Apply 1 each topically daily 30 each 5       Past History     Past Medical History:  Past Medical  History:   Diagnosis Date    Closed fracture of left side of maxilla 04/03/2017    DVT (deep venous thrombosis)     Facial contusion, initial encounter 04/03/2017    Gastroesophageal reflux disease     Hyperlipemia     Left orbit fracture, closed, initial encounter 04/03/2017    PVD (peripheral vascular disease)        Past Surgical History:  Past Surgical History:   Procedure Laterality Date    ABDOMINAL SURGERY      Hernia repair       Family History:  Family History   Problem Relation Age of Onset    Heart disease Father     Stroke Father     Diabetes Maternal Aunt        Social History:  Social History     Tobacco Use    Smoking status: Never  Passive exposure: Never    Smokeless tobacco: Never   Vaping Use    Vaping status: Never Used   Substance Use Topics    Alcohol use: Yes     Comment: 3 per week    Drug use: No       Allergies:  Allergies   Allergen Reactions    Penicillins      Disorientation       Review of Systems     Review of Systems   Constitutional:  Negative for chills and fever.   HENT:  Negative for congestion.    Eyes:  Negative for visual disturbance.   Respiratory:  Negative for cough.    Cardiovascular:  Negative for chest pain.   Gastrointestinal:  Negative for abdominal pain, nausea and vomiting.   Genitourinary:  Negative for dysuria.   Musculoskeletal:  Positive for arthralgias and gait problem. Negative for myalgias.        R hip pain. Chronic left foot pain in walking boot   Neurological:  Negative for dizziness, numbness and headaches.   Psychiatric/Behavioral:  Negative for confusion.        Physical Exam   BP 138/76   Pulse 78   Temp 98.1 F (36.7 C) (Oral)   Resp 18   SpO2 98%     Physical Exam  Vitals and nursing note reviewed.   Constitutional:       General: He is not in acute distress.     Appearance: He is well-developed.   HENT:      Head: Normocephalic and atraumatic.   Eyes:      Pupils: Pupils are equal, round, and reactive to light.   Cardiovascular:      Rate and  Rhythm: Normal rate and regular rhythm.      Heart sounds: Normal heart sounds.   Pulmonary:      Effort: Pulmonary effort is normal.      Breath sounds: Normal breath sounds. No wheezing.   Abdominal:      Palpations: Abdomen is soft.      Tenderness: There is no abdominal tenderness.   Musculoskeletal:         General: Normal range of motion.      Cervical back: Normal range of motion and neck supple.      Comments: R lateral hip TTP. No bruising. Normal R hip ROM. No R knee ROM. No swelling. Normal distal pulses of RLE. No sensory deficit. L foot in post op poot.    Skin:     General: Skin is warm and dry.   Neurological:      General: No focal deficit present.      Mental Status: He is alert and oriented to person, place, and time.      Cranial Nerves: No cranial nerve deficit.      Sensory: No sensory deficit.      Motor: No weakness.      Coordination: Coordination normal.      Gait: Gait normal.   Psychiatric:         Behavior: Behavior normal.         Diagnostic Study Results     Labs -     Results       ** No results found for the last 24 hours. **            Radiologic Studies -   Radiology Results (24 Hour)       Procedure Component Value Units Date/Time  Femur Right AP and Lateral [[id number]] Collected: 10/22/21 2331    Order Status: Completed Updated: 10/22/21 2334    Narrative:      HISTORY: eval for fx.    COMPARISON:  None     TECHNIQUE: XR FEMUR RIGHT AP AND LATERAL    FINDINGS:   The bones and joints are normal.  There is no evidence of fracture or dislocation. Vascular calcifications are present.      Impression:        No evidence of fracture.    Jorene Guest, MD  10/22/2021 11:32 PM    XR Hip right 2-3 vw with Pelvis [[id number]] Collected: 10/22/21 2329    Order Status: Completed Updated: 10/22/21 2333    Narrative:      Clinical History:    eval for fx    Technique:    XR HIP RIGHT 2-3 VW WITH PELVIS    Comparison:    None    Findings:    The soft tissues demonstrate scattered vascular  calcifications and the left-sided vascular stent. There is diffuse bony demineralization. There are degenerative changes of the lower lumbar spine. The bony pelvis is intact. There is mild right and severe   left hip joint arthrosis. There is no evidence of a fracture or dislocation.      Impression:          No acute osseous abnormality.    Osteoarthritic changes as described above.    Vascular stent noted within the proximal left thigh.    Alric Seton MD, MD  10/22/2021 11:31 PM    CT Head WO Contrast [[id number]] Collected: 10/22/21 2307    Order Status: Completed Updated: 10/22/21 2316    Narrative:      Clinical History:  Status post fall, on Eliquis. Rule out intracranial hemorrhage.    Examination:  CT HEAD WO CONTRAST    TECHNIQUE:  5 mm helical images obtained from the skull base through the vertex without contrast. 3 mm sagittal and coronal reformatted images provided.  CT images were acquired using Automated Exposure Control for dose reduction.     COMPARISON:   CT head dated 04/02/2017    FINDINGS:     There is no evidence of acute intracranial hemorrhage, extra-axial collection, mass effect, midline shift or herniation. The ventricles, sulci and cisterns are prominent, compatible with generalized volume loss.  There are periventricular white matter   hypodensities which most likely represent chronic microangiopathic changes. The visualized paranasal sinuses and mastoid air cells are clear.   The surrounding soft tissues and osseous structures are unremarkable.        Impression:          No acute intracranial hemorrhage.    Generalized volume loss and chronic microangiopathic changes.    Carleene Overlie, MD  10/22/2021 11:14 PM        .    Medical Decision Making   I am the first provider for this patient.    I reviewed the vital signs, available nursing notes, past medical history, past surgical history, family history and social history.      Vital Signs-Reviewed the patient's vital signs.   No data  found.    Pulse Oximetry Analysis - Normal       Xrays: Ordered, reviewed and interpreted by me, confirmed by radiology report.       Critical Care Time:     ED Course/ Provider Note/ MDM: :   Initial differential diagnosis to include  but not limited to: fracture, sprain, bruising, ligamentous injury, tendon injury, and muscle injury    ED Course as of 10/26/21 0948   Sun Oct 22, 2021   273 HPI 87 year old man status post fall while trying to get out of an [employer].  Patient currently wearing a boot on the left foot for treatment of a nonhealing ulcer.  Patient now complaining of right hip pain.  All occurred at noon.  Patient was able to stand up and bear weight on the right lower extremity.  Patient reports progressive worsening pain.  Patient able to flex and extend at the hip.  Patient does take Eliquis.  Patient's wife states that his head was lodged up near a wooden chair.  Patient states he cannot recall whether he hit his head or not.  No loss of consciousness.  No bruising or facial hematoma.  Patient denies any preceding dizziness lightheadedness prior to fall. [ST]   Mon Oct 23, 2021   0003 No evidence of fx on hip and femur xray. Pt complaining of left hip pain and cramps of pain. Daughter requesting something stronger for his pain. He has tolerated tramadol in the past. Pt is ambulating independently and bearing weight on the LLE. Safe for d/c home. Discussed risks of tramadol with patient and patient's daughter.  [ST]      ED Course User Index  [ST] Cherlyn Roberts, MD         Medical Decision Making  See ED course    Amount and/or Complexity of Data Reviewed  Radiology: ordered.    Risk  OTC drugs.  Prescription drug management.           Dr. Audley Hose is the primary emergency doctor of record.    Diagnosis     Clinical Impression:   1. Hip strain, left, initial encounter    2. Fall, initial encounter        Treatment Plan:   ED Disposition       ED Disposition   Discharge    Condition    --    Date/Time   Sun Oct 22, 2021 11:37 PM    Comment   [facility] discharge to home/self care.    Condition at disposition: Stable                   _______________________________      This note was generated by the Epic EMR system/ Dragon speech recognition and may contain inherent errors or omissions not intended by the user. Grammatical errors, random word insertions, deletions and pronoun errors  are occasional consequences of this technology due to software limitations. Not all errors are caught or corrected. If there are questions or concerns about the content of this note or information contained within the body of this dictation they should be addressed directly with the author for clarification. The use of the ED course in this note was to facilitate documentation. The time stamps of the HPI, ROS, physical exam,EKG interpretation within the ED course reflect the time these elements were documented in the chart, not the time they were physically completed.      _______________________________       Cherlyn Roberts, MD  10/26/21 [phone]

## 2021-11-29 ENCOUNTER — Telehealth: Payer: Self-pay

## 2021-11-29 NOTE — Telephone Encounter (Addendum)
Called patient to schedule 3 month follow up appointment.  Patient did not pick up; left voicemail and will await patient's call back.     Addendum: Marjean Donna called back stating that this is wrong number and should be removed from this patient's chart.  Number will be removed.

## 2021-11-29 NOTE — Telephone Encounter (Signed)
Called daughter, [name].  Unable to leave VM as mailbox is full    Called daughter, [name].  Daughter did not pick up; left voicemail and will await call back.

## 2021-12-12 ENCOUNTER — Telehealth (INDEPENDENT_AMBULATORY_CARE_PROVIDER_SITE_OTHER): Payer: Self-pay | Admitting: Physician Assistant

## 2021-12-12 ENCOUNTER — Ambulatory Visit: Payer: Medicare Other | Admitting: Physician Assistant

## 2021-12-12 VITALS — BP 118/65 | HR 81

## 2021-12-12 DIAGNOSIS — S91302S Unspecified open wound, left foot, sequela: Secondary | ICD-10-CM

## 2021-12-12 DIAGNOSIS — R3915 Urgency of urination: Secondary | ICD-10-CM

## 2021-12-12 DIAGNOSIS — N390 Urinary tract infection, site not specified: Secondary | ICD-10-CM

## 2021-12-12 DIAGNOSIS — M1990 Unspecified osteoarthritis, unspecified site: Secondary | ICD-10-CM

## 2021-12-12 DIAGNOSIS — R32 Unspecified urinary incontinence: Secondary | ICD-10-CM

## 2021-12-12 DIAGNOSIS — R300 Dysuria: Secondary | ICD-10-CM

## 2021-12-12 DIAGNOSIS — G629 Polyneuropathy, unspecified: Secondary | ICD-10-CM

## 2021-12-12 MED ORDER — DICLOFENAC SODIUM 1 % EX GEL
2.0000 g | Freq: Four times a day (QID) | CUTANEOUS | 3 refills | Status: DC
Start: 2021-12-12 — End: 2021-12-14

## 2021-12-12 NOTE — Telephone Encounter (Signed)
Dtr [name] called to inform PCP since there is a visit scheduled this afternoon Friday had numbness in mostly R arm and R leg, was sitting in back of car on trip, also reported chest pain and weakness, then said he was feeling ok, and he did not want to go.  Today [name] did not see pt,  her sister is trying to locate pt in the community he and spouse live.  No VS available.  Dtr [name] asking if PCP should consider testing.  RN send this message to PCP as priority and PCP replied she has seen pt today and will call dtr.

## 2021-12-12 NOTE — Telephone Encounter (Signed)
Called daughter and left VM

## 2021-12-13 ENCOUNTER — Telehealth (INDEPENDENT_AMBULATORY_CARE_PROVIDER_SITE_OTHER): Payer: Self-pay | Admitting: Physician Assistant

## 2021-12-13 ENCOUNTER — Encounter (INDEPENDENT_AMBULATORY_CARE_PROVIDER_SITE_OTHER): Payer: Self-pay | Admitting: Physician Assistant

## 2021-12-13 DIAGNOSIS — R3915 Urgency of urination: Secondary | ICD-10-CM | POA: Insufficient documentation

## 2021-12-14 ENCOUNTER — Telehealth (INDEPENDENT_AMBULATORY_CARE_PROVIDER_SITE_OTHER): Payer: Self-pay | Admitting: Physician Assistant

## 2021-12-14 MED ORDER — DICLOFENAC SODIUM 1 % EX GEL
2.0000 g | Freq: Two times a day (BID) | CUTANEOUS | 5 refills | Status: DC
Start: 2021-12-14 — End: 2022-05-15

## 2021-12-14 NOTE — Telephone Encounter (Signed)
Maricela Curet ,RN at [facility] called in to request Rx instructions clarifications , Rx diclofenac Sodium (VOLTAREN) 1 % Gel topical gel  instructions reads : "Apply 2 g topically 4 (four) times daily UE-apply 2g to affected area 4 times a day - Max 8mg /day; LE-apply 4g to affected area 4 times a day - Max 16mg /day" but nurse said patient unable to tell her where specific hurts ,ie: arms , hands, shoulder, elbow , knee , foot, leg . RN asking if should apply to Bilateral arms starting from shoulders to the hands and BLE starting from hips to the feet.    [facility]aga # [phone].    Please advise.

## 2021-12-15 NOTE — Telephone Encounter (Signed)
This lpn faxed order for diclofenac Sodium (VOLTAREN) 1 % Gel topical gel to [facility] [phone]

## 2022-01-02 ENCOUNTER — Telehealth (INDEPENDENT_AMBULATORY_CARE_PROVIDER_SITE_OTHER): Payer: Self-pay | Admitting: Physician Assistant

## 2022-01-02 NOTE — Telephone Encounter (Signed)
Called daughter to schedule followup with Dr. Dickey Gave.  Scheduled patient for 01/05/22 at 11:30 AM.  Provided daughter with address.

## 2022-01-02 NOTE — Telephone Encounter (Signed)
Daughter called to inform that patient had an appt with Dr Frederica Kuster Urologist on 7/11, daughter requesting to share H&P records with Urologist,LPN inform daughter that we can do that and will contact Dr Eldridge Scot office to request pts AVS.    LPN called [facility] office  spoke to [facility] [place] ,informed that patients H&P was faxed over to them and requested a copy of this patients office visit for todays 7/11 appt with them .  Marisue Ivan agreed will fax over pts AVS.

## 2022-01-05 ENCOUNTER — Ambulatory Visit
Payer: Medicare Other | Attending: Student in an Organized Health Care Education/Training Program | Admitting: Student in an Organized Health Care Education/Training Program

## 2022-01-05 VITALS — BP 107/63 | HR 75 | Temp 97.4°F | Wt 167.0 lb

## 2022-01-05 DIAGNOSIS — C760 Malignant neoplasm of head, face and neck: Secondary | ICD-10-CM | POA: Insufficient documentation

## 2022-01-05 NOTE — Progress Notes (Signed)
[facility]       Patient Name: Dillon Wood   Referring Provider: Hilma Favors, PA    Primary Dx:   1. Squamous cell carcinoma of head and neck           ONCOLOGY CARE TEAM  Mohs Surgery: Rosey Bath  Dermatology: Doran Durand, MD   Derm Onc:  Lily Lovings, MD  Radiation Oncology:  Theadora Rama, MD ([facility])      HISTORY OF PRESENT ILLNESS  Dillon Wood is 88 y.o. 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     Recently moved into assisted living facility with his wife from [place]. When he relocated here, he was seen by his geriatrics team 2/24, at which time the patient's family member noted worsening memory loss over the preceding months, with episodes of agitation. Family expressed concern that this cancer could have spread to his brain. 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 He was previously lined up to pursue endarterectomy in 05/2021 but family changed their minds at the last minute.    Presents today for follow up. Accompanied by his grandson Hoven. Very pleasant and jovial on our interaction. He has been more immobilized due to ulcers developing on the bottom of his L foot, can get up and transfer but walks very little. Follows with a Development worker, international aid for wound care. Lucila [facility] believes memory issues are about the same.    Oncology History   Squamous cell carcinoma of head and neck   08/23/2020 Initial Diagnosis    Squamous cell carcinoma of the scalp     09/14/2020 Surgery    Moh's resection (Dr. Rosey Bath, [facility], [place])  Size: 2.5cm  Invasion: 5.1mm, through subQ fat  PNI: +  (maximum caliber 0.30mm)     BWH T2b     10/05/2020 - 11/01/2020 Radiation    50 Gy in 20 fractions (Dr. Theadora Rama, [facility], [place])     09/20/2021 Cancer Staged    Staging form: Cutaneous Carcinoma of the Head and Neck, AJCC 8th Edition  - Clinical stage from 09/20/2021: Stage III (cT3, cN0, cM0) - Signed by Algis Greenhouse, MD on 09/20/2021           Allergies   Allergen Reactions    Penicillins      Disorientation     Current Outpatient Medications   Medication Sig Dispense Refill    ampicillin (PRINCIPEN) 500 MG capsule       apixaban (ELIQUIS) 5 MG Take 1 tablet (5 mg) by mouth every 12 (twelve) hours      aspirin EC 81 MG EC tablet Take 1 tablet (81 mg total) by mouth daily 30 tablet 1    Aspirin Low Dose 81 MG chewable tablet       atorvastatin (LIPITOR) 40 MG tablet Take 1 tablet (40 mg total) by mouth daily 90 tablet 3    clopidogrel (PLAVIX) 75 mg tablet Take 1 tablet (75 mg) by mouth daily      diclofenac Sodium (VOLTAREN) 1 % Gel topical gel Apply 2 g topically 2 (two) times daily Apply a dime  amount to left hand and wrist BID for pain and inflammation 100 g 5    doxycycline (MONODOX) 100 MG capsule       finasteride (PROSCAR) 5 MG tablet       fluorouracil (EFUDEX) 5 % cream       imiquimod (ALDARA) 5 % cream       levoFLOXacin (LEVAQUIN) 750 MG tablet Take 1 tablet (750 mg) by mouth every other day      LORazepam (ATIVAN) 0.5 MG tablet Take 1 tablet (0.5 mg) by mouth every 6 (six) hours as needed for Anxiety 45 tablet 0    naloxone (EVZIO) 2 MG/0.4ML Solution Auto-injector injection Use one auto-injector upon signs of opioid overdose. Call 911. May repeat x 1. 2 each 0    phenazopyridine (PYRIDIUM) 100 MG tablet       tamsulosin (FLOMAX) 0.4 MG Cap Take 1 capsule (0.4 mg) by mouth Daily after dinner      Wound Dressings (Foam Dressing Circular Border) Pads Apply 1 each topically daily 30 each 5     No current facility-administered medications for this visit.     Past Medical History:    Diagnosis Date    Closed fracture of left side of maxilla 04/03/2017    DVT (deep venous thrombosis)     Facial contusion, initial encounter 04/03/2017    Gastroesophageal reflux disease     Hyperlipemia     Left orbit fracture, closed, initial encounter 04/03/2017    PVD (peripheral vascular disease)      Past Surgical History:   Procedure Laterality Date    ABDOMINAL SURGERY      Hernia repair     Family History   Problem Relation Age of Onset    Heart disease Father     Stroke Father     Diabetes Maternal Aunt      Social History     Socioeconomic History    Marital status: Married    Number of children: 4   Occupational History    Occupation: Surveyor, quantity of schools   Tobacco Use    Smoking status: Never     Passive exposure: Never    Smokeless tobacco: Never   Vaping Use    Vaping Use: Never used   Substance and Sexual Activity    Alcohol use: Yes     Comment: 3 per week    Drug use: No     Review of Systems:  Negative except as noted above    Objective:     Vitals:    01/05/22 1138   BP: 107/63   Pulse: 75   Temp: 97.4 F (36.3 C)   SpO2: 97%     ECOG PS: 1-2  General: NAD  Eyes: Anicteric, normal conjunctiva  ENT: Poor hearing  CV: RRR  RESP: CTA B  GI: NT/ND, no HSM  Neuro: AAO x 3  MS: no swelling in arms or legs  SKIN:   - left posterior vertex scalp 6 cm well healed scar   - right vertex is curvilinear scar 6 cm left temple graft site.   - extensive actinic damage and keratosis scalp forehead ears and arms  LYMPH: no cervical suplarclavicular axillary lymphadenopathy    LAB DATA:   Lab Results   Component Value Date    WBC 7.67 04/02/2017    HGB 12.8 (L) 04/02/2017    HCT 37.0 (L) 04/02/2017    MCV 90.9 04/02/2017    PLT 136 (L) 04/02/2017  Lab Results   Component Value Date    CREAT 1.2 04/02/2017    BUN 14 04/02/2017    NA 140 04/02/2017    K 3.5 04/02/2017    CL 108 04/02/2017    CO2 23 04/02/2017     No results found for: "ALT", "AST", "GGT", "ALKPHOS", "BILITOTAL"    RADIOLOGY DATA:   MRI  Brain w/ and w/o 10/12/21  FINDINGS:   There are no abnormal fluid collections. There are no masses. There is  no mass effect or midline shift. Ventricles and sulci are  age-appropriate. Chronic ischemic changes are present.  Diffusion-weighted imaging is unremarkable. There is no evidence of  acute infarction. Scattered foci of hemosiderin deposition are present  secondary to chronic microhemorrhage. The gray-white matter junctions  are unremarkable. Clival and calvarial marrow signal is normal. Upon the  administration of intravenous contrast, there is no abnormal  enhancement.     There appears to be a 1.1 cm lesion in the right parotid gland deep  lobe.    IMPRESSION:   1. No acute intracranial process.  2. No mass, hydrocephalus, or pathologic fluid collection.  3. No acute infarct.  4. Chronic ischemic changes and age-appropriate volume are present.   5. There is a 1.1 cm lesion in the right parotid gland deep lobe. Its  imaging characteristics suggest a benign cyst however the lesion is  nonspecific      MRA Neck 10/12/21  IMPRESSION:   1.Stable MRA neck.  2.Again present is a greater than 90% stenosis of the right carotid bulb  by NASCET criteria. Extensive atherosclerotic disease throughout the  remainder of the cervical right ICA is noted with multifocal moderate  grade stenoses.  3.There is 62% stenosis of the proximal left internal carotid artery  based on NASCET criteria.  4.The vertebral arteries remain patent      PATHOLOGY DATA:  Final Diagnosis      A. Skin, biopsy, left vertex scalp:  -  SQUAMOUS CELL CARCINOMA, WELL DIFFERENTIATED      B. Skin, biopsy, left forearm:  -  SQUAMOUS CELL CARCINOMA, WELL DIFFERENTIATED (ACANTHOLYTIC)      Electronically signed by Lanice Schwab, MD on 08/16/2020 at 12:57 PM         Final Diagnosis      A. Skin, Mohs debulk, left vertex scalp:  -  SQUAMOUS CELL CARCINOMA, WELL DIFFERENTIATED     NOTE: The tumor invades to a depth of at least 5.5 mm and is present  at the base of the specimen. There is perineural invasion, with maximum nerve caliber 0.09 mm. No lymphovascular invasion is seen.      B. Skin, Mohs stage 1, left vertex scalp:  -  SQUAMOUS CELL CARCINOMA, WELL DIFFERENTIATED     NOTE: No perineural or lymphovascular invasion is seen.          Assessment & Plan:     1. 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 Kandice Moos and Dr. Sherlean Foot (can alternate between them).  - given >53yr since definitive treatment, no role for routine cross-sectional imaging given the very low likelihood of  distant metastatic spread; will order imaging only to assess specific areas of concern  - I explained that brain metastases are almost never seen with this disease, and certainly not in the absence of other widespread metastatic disease. Local recurrences are common however, and in the worst cases invasion into the skull may be seen, but this should present with pain rather than neurocognitive changes.    2. Worsening memory loss, agitation  - In the setting of a long term neurocognitive decline c/w dementia, which predates the cancer  - More likely is that he is experiencing stepwise decline in his neurocognitive status, in a pattern more keeping with vascular dementia, which can have these more sudden drops in functioning  - I explained that scalp radiation can sometimes cause radiation scatter to the brain parenchyma itself, which can be symptomatic, however this was not seen on MRI Brain 10/12/21  - MRA neck 10/12/21 re-demonstrating severe R carotid stenosis. I explained that he is at high risk of recurrent strokes, silent or  symptomatic, but the risks of surgery at his age and level of debility would be considerable. Defer to vascular surgery.  - F/U with geriatrics for ongoing support    3. 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 Sherlean Foot  - Derm surveillance as above    RTC in 51mo        Tramane Gorum Al-Mondhiry, MD, MA  Medical Oncologist, Cutaneous Malignancies Program  [facility]  [id number]  [place], [place] [address]  Phone: [phone]  Fax: [phone]

## 2022-01-17 ENCOUNTER — Encounter (INDEPENDENT_AMBULATORY_CARE_PROVIDER_SITE_OTHER): Payer: Medicare Other | Admitting: Physician Assistant

## 2022-02-07 ENCOUNTER — Ambulatory Visit: Payer: Medicare Other | Admitting: Physician Assistant

## 2022-02-07 DIAGNOSIS — S91302S Unspecified open wound, left foot, sequela: Secondary | ICD-10-CM

## 2022-02-07 DIAGNOSIS — S91302A Unspecified open wound, left foot, initial encounter: Secondary | ICD-10-CM

## 2022-02-07 DIAGNOSIS — G629 Polyneuropathy, unspecified: Secondary | ICD-10-CM

## 2022-02-08 ENCOUNTER — Encounter (INDEPENDENT_AMBULATORY_CARE_PROVIDER_SITE_OTHER): Payer: Self-pay | Admitting: Physician Assistant

## 2022-02-08 NOTE — Progress Notes (Signed)
Subjective     Patient was seen in an assisted living facility ([facility]) in lieu of an office visit for the following reason:   Requires use of an assistive device in order to ambulate due to poor balance.    HPI:   [name] is seen today in follow up.  He is doing well and at baseline.      Review of Systems   Constitutional:  Negative for chills, fever, malaise/fatigue and weight loss.   Respiratory:  Positive for shortness of breath. Negative for cough and wheezing.    Cardiovascular:  Negative for chest pain and leg swelling.   Gastrointestinal:  Negative for constipation, diarrhea, nausea and vomiting.   Genitourinary:  Negative for dysuria, frequency and urgency.   Musculoskeletal:  Negative for falls.   Skin:  Negative for itching and rash.   Psychiatric/Behavioral:  Positive for memory loss.             Objective   Past Medical History:   Diagnosis Date    Closed fracture of left side of maxilla 04/03/2017    DVT (deep venous thrombosis)     Facial contusion, initial encounter 04/03/2017    Gastroesophageal reflux disease     Hyperlipemia     Left orbit fracture, closed, initial encounter 04/03/2017    PVD (peripheral vascular disease)      Past Surgical History:   Procedure Laterality Date    ABDOMINAL SURGERY      Hernia repair     Family History   Problem Relation Age of Onset    Heart disease Father     Stroke Father     Diabetes Maternal Aunt      Social History     Tobacco Use    Smoking status: Never     Passive exposure: Never    Smokeless tobacco: Never   Vaping Use    Vaping Use: Never used   Substance Use Topics    Alcohol use: Yes     Comment: 3 per week    Drug use: No     Allergies   Allergen Reactions    Penicillins      Disorientation       MEDICATIONS:     Current Outpatient Medications:     ampicillin (PRINCIPEN) 500 MG capsule, , Disp: , Rfl:     apixaban (ELIQUIS) 5 MG, Take 1 tablet (5 mg) by mouth every 12 (twelve) hours, Disp: , Rfl:     aspirin EC 81 MG EC tablet, Take 1 tablet (81  mg total) by mouth daily, Disp: 30 tablet, Rfl: 1    Aspirin Low Dose 81 MG chewable tablet, , Disp: , Rfl:     atorvastatin (LIPITOR) 40 MG tablet, Take 1 tablet (40 mg total) by mouth daily, Disp: 90 tablet, Rfl: 3    clopidogrel (PLAVIX) 75 mg tablet, Take 1 tablet (75 mg) by mouth daily, Disp: , Rfl:     diclofenac Sodium (VOLTAREN) 1 % Gel topical gel, Apply 2 g topically 2 (two) times daily Apply a dime amount to left hand and wrist BID for pain and inflammation, Disp: 100 g, Rfl: 5    doxycycline (MONODOX) 100 MG capsule, , Disp: , Rfl:     finasteride (PROSCAR) 5 MG tablet, , Disp: , Rfl:     fluorouracil (EFUDEX) 5 % cream, , Disp: , Rfl:     imiquimod (ALDARA) 5 % cream, , Disp: , Rfl:  levoFLOXacin (LEVAQUIN) 750 MG tablet, Take 1 tablet (750 mg) by mouth every other day, Disp: , Rfl:     LORazepam (ATIVAN) 0.5 MG tablet, Take 1 tablet (0.5 mg) by mouth every 6 (six) hours as needed for Anxiety, Disp: 45 tablet, Rfl: 0    naloxone (EVZIO) 2 MG/0.4ML Solution Auto-injector injection, Use one auto-injector upon signs of opioid overdose. Call 911. May repeat x 1., Disp: 2 each, Rfl: 0    phenazopyridine (PYRIDIUM) 100 MG tablet, , Disp: , Rfl:     tamsulosin (FLOMAX) 0.4 MG Cap, Take 1 capsule (0.4 mg) by mouth Daily after dinner, Disp: , Rfl:     Wound Dressings (Foam Dressing Circular Border) Pads, Apply 1 each topically daily, Disp: 30 each, Rfl: 5    PHYSICAL EXAM:   There were no vitals taken for this visit.   Wt Readings from Last 1 Encounters:   01/05/22 75.8 kg (167 lb)      Ht Readings from Last 1 Encounters:   09/22/21 1.778 m (5\' 10" )     Physical Exam  Constitutional:       General: He is not in acute distress.     Appearance: Normal appearance.   HENT:      Head: Normocephalic and atraumatic.      Right Ear: External ear normal.      Left Ear: External ear normal.      Nose: Nose normal.      Mouth/Throat:      Mouth: Mucous membranes are dry.   Eyes:      Extraocular Movements: Extraocular  movements intact.      Conjunctiva/sclera: Conjunctivae normal.   Pulmonary:      Effort: Pulmonary effort is normal. No respiratory distress.   Abdominal:      General: There is no distension.   Musculoskeletal:         General: No swelling. Normal range of motion.      Cervical back: Neck supple.   Skin:     General: Skin is warm.      Capillary Refill: Capillary refill takes less than 2 seconds.      Coloration: Skin is not jaundiced.   Neurological:      General: No focal deficit present.      Mental Status: He is alert.   Psychiatric:         Mood and Affect: Mood normal.       DIAGNOSTICS:     Lab Results   Component Value Date    WBC 7.67 04/02/2017    HGB 12.8 (L) 04/02/2017    HCT 37.0 (L) 04/02/2017    PLT 136 (L) 04/02/2017    NA 140 04/02/2017    K 3.5 04/02/2017    CL 108 04/02/2017    CREAT 1.2 04/02/2017    BUN 14 04/02/2017    CO2 23 04/02/2017    INR 1.1 04/02/2017    GLU 96 04/02/2017     No results found.         Plan   ASSESSMENT and PLAN:       Merwyn was seen today for urinary urgency.    Diagnoses and all orders for this visit:    Left plantar foot wound   - In healing stages.  Patient continues to see wound clinic weekly and wear the boot.  He ambulates on it more frequently now.      Memory loss   - Unchanged.  Continue ALF care

## 2022-02-19 ENCOUNTER — Encounter (INDEPENDENT_AMBULATORY_CARE_PROVIDER_SITE_OTHER): Payer: Self-pay | Admitting: Physician Assistant

## 2022-02-21 ENCOUNTER — Telehealth (INDEPENDENT_AMBULATORY_CARE_PROVIDER_SITE_OTHER): Payer: Self-pay | Admitting: Physician Assistant

## 2022-02-21 MED ORDER — TRAMADOL HCL 50 MG PO TABS
50.0000 mg | ORAL_TABLET | Freq: Two times a day (BID) | ORAL | 1 refills | Status: DC | PRN
Start: 2022-02-21 — End: 2022-05-15

## 2022-02-21 NOTE — Telephone Encounter (Signed)
Patient has TRAMADOL in cart at ALF.    The LPN is reviewing the cart.      There is an order in ALF record.    If PCP agrees, please send order to [facility].    If not, please send discontinue order to [facility]    Mayme Genta [phone], Nurse ALF.

## 2022-02-21 NOTE — Telephone Encounter (Signed)
Rx for tramadol sent to pharmacy

## 2022-04-04 ENCOUNTER — Encounter (INDEPENDENT_AMBULATORY_CARE_PROVIDER_SITE_OTHER): Payer: Medicare Other | Admitting: Physician Assistant

## 2022-04-18 ENCOUNTER — Ambulatory Visit: Payer: Medicare Other | Admitting: Physician Assistant

## 2022-04-18 DIAGNOSIS — S91302D Unspecified open wound, left foot, subsequent encounter: Secondary | ICD-10-CM

## 2022-04-18 DIAGNOSIS — R413 Other amnesia: Secondary | ICD-10-CM

## 2022-04-19 ENCOUNTER — Encounter (INDEPENDENT_AMBULATORY_CARE_PROVIDER_SITE_OTHER): Payer: Self-pay | Admitting: Physician Assistant

## 2022-04-19 IMAGING — CT CT HEAD W/O CM
4 series · 16 of 47 positions shown, 18 images · non-contrast
Comparison: None.

CLINICAL DATA: Dizziness and unsteady gait

EXAM:
CT HEAD WITHOUT CONTRAST
TECHNIQUE: Contiguous axial images were obtained from the base of the skull
through the vertex without intravenous contrast.

[Series 3: head without · axial · non-contrast · 0.45mm/px · z∈[-101,+14]mm · 7 of 31 slices shown, 9 images]
[im 4/31  brain]
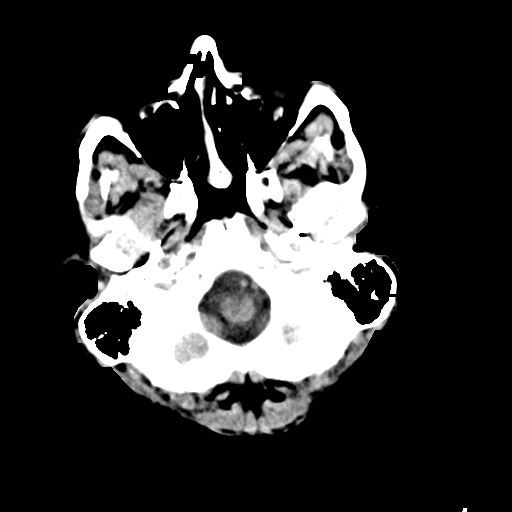
[im 4/31  bone]
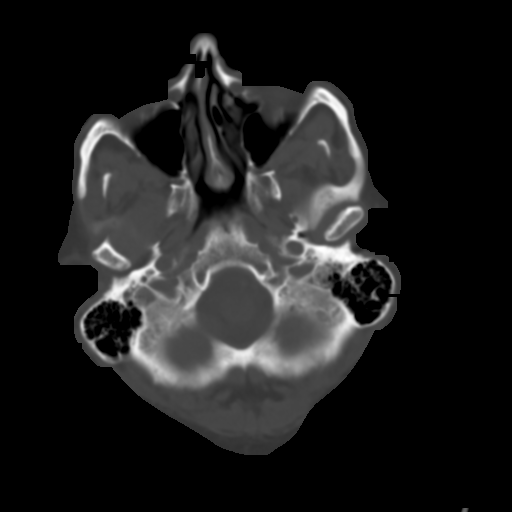
[im 8/31  brain]
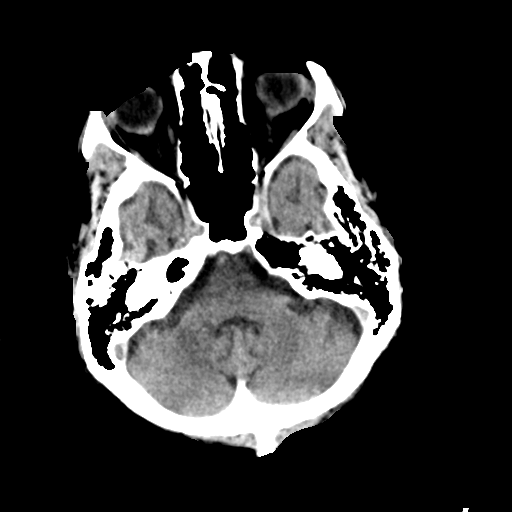
[im 12/31  brain]
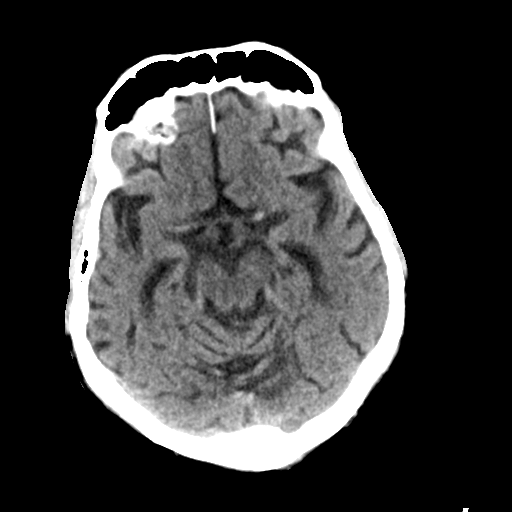
[im 16/31  brain]
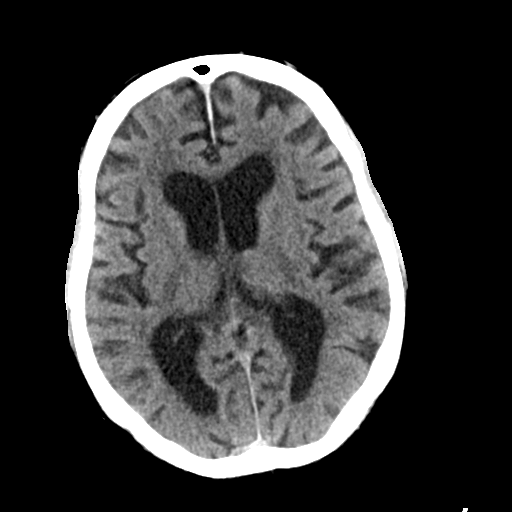
[im 19/31  brain]
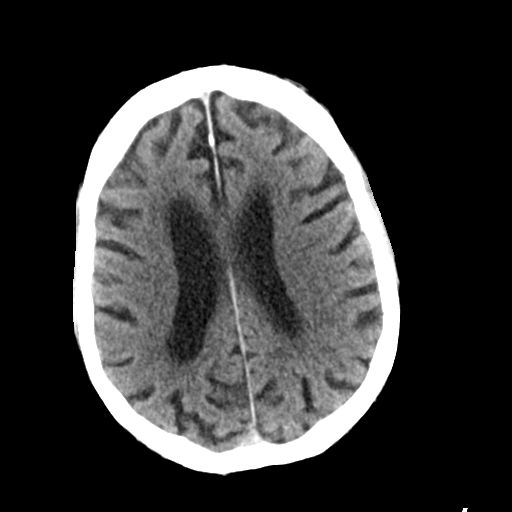
[im 19/31  bone]
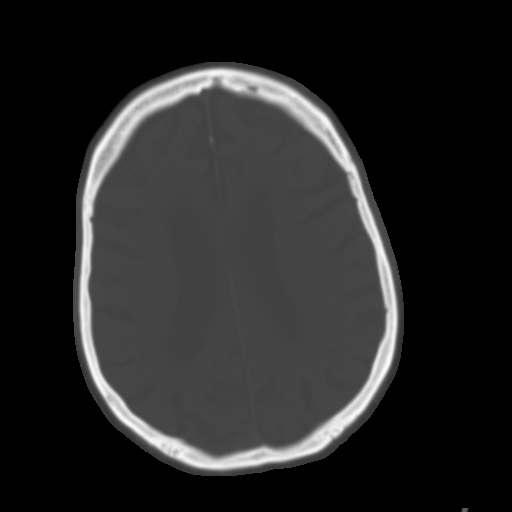
[im 23/31  brain]
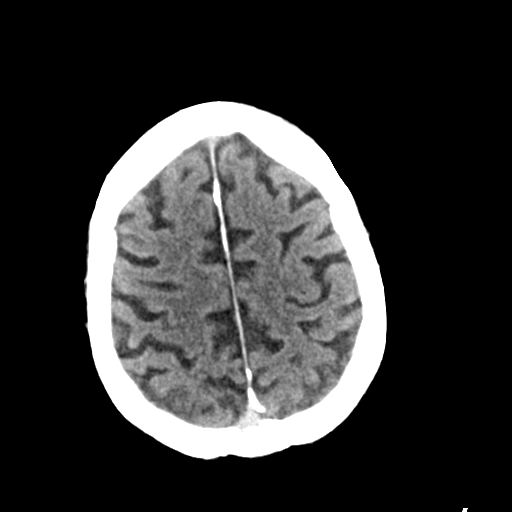
[im 27/31  brain]
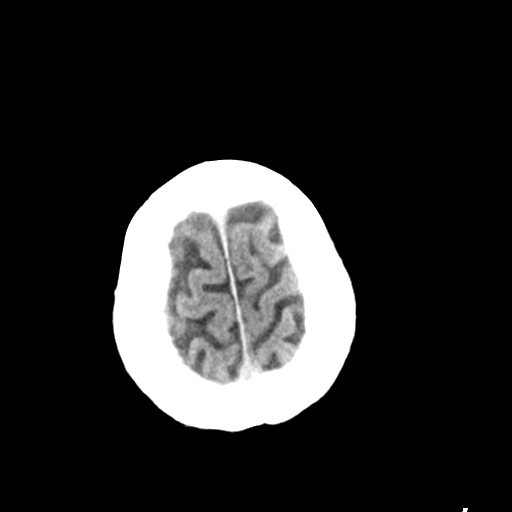

[Series 4: head bone · axial · 0.45mm/px · z∈[-102,-72]mm · 3 of 77 slices shown]
[im 8/77  bone]
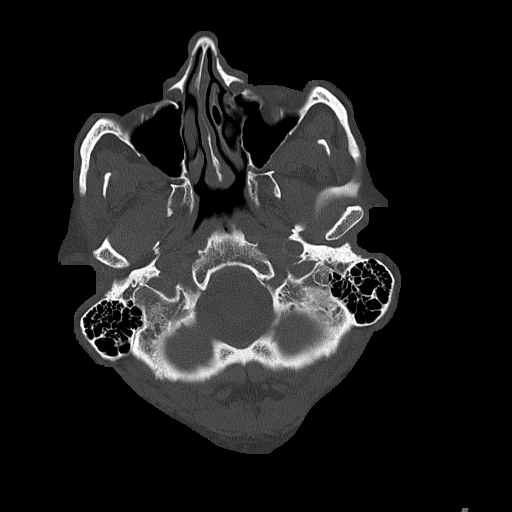
[im 16/77  bone]
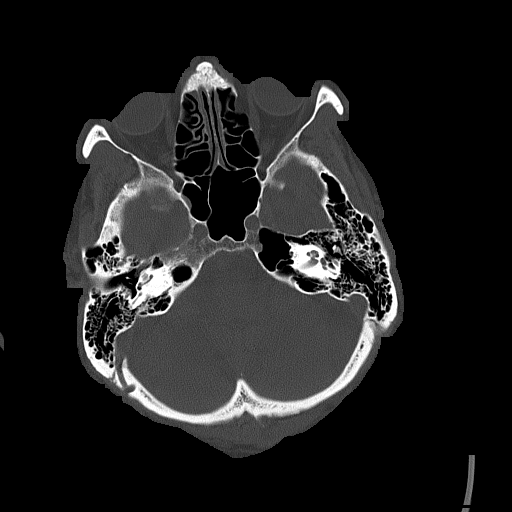
[im 23/77  bone]
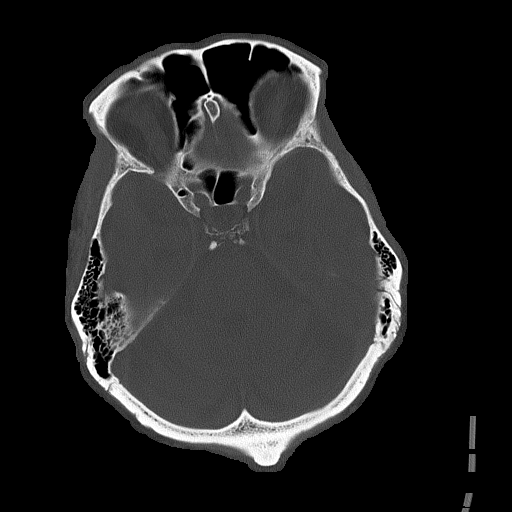

[Series 5: head without cor · coronal · non-contrast · 0.32mm/px · 3 of 67 slices shown]
[im 23/67  brain]
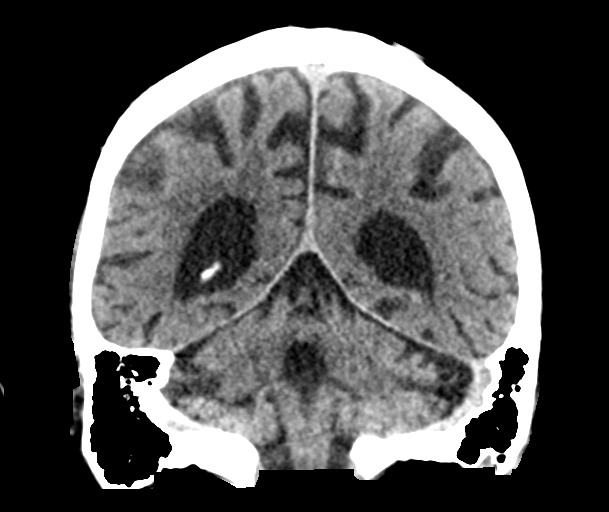
[im 30/67  brain]
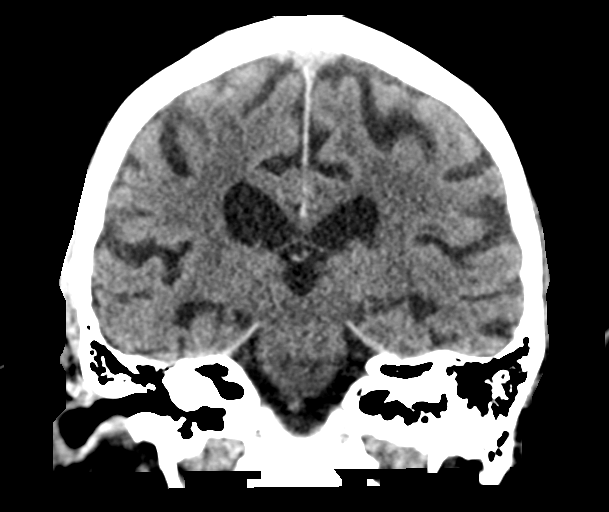
[im 37/67  brain]
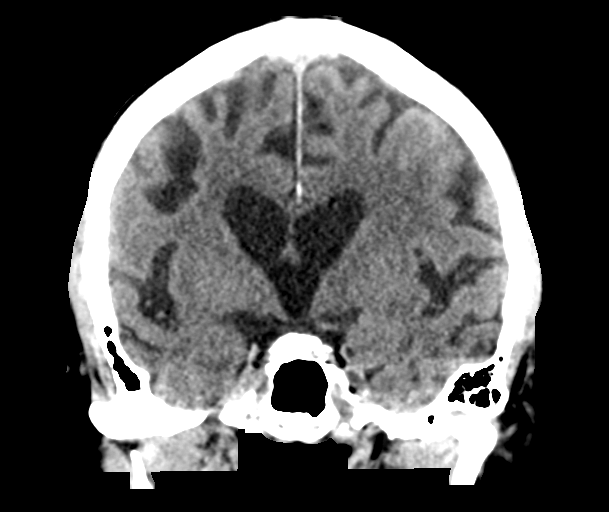

[Series 6: head without sag · sagittal · non-contrast · 0.33mm/px · 3 of 58 slices shown]
[im 20/58  brain]
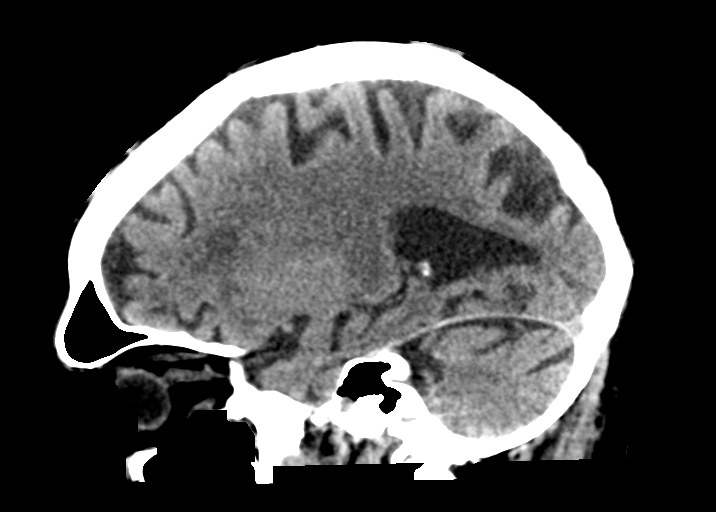
[im 29/58  brain]
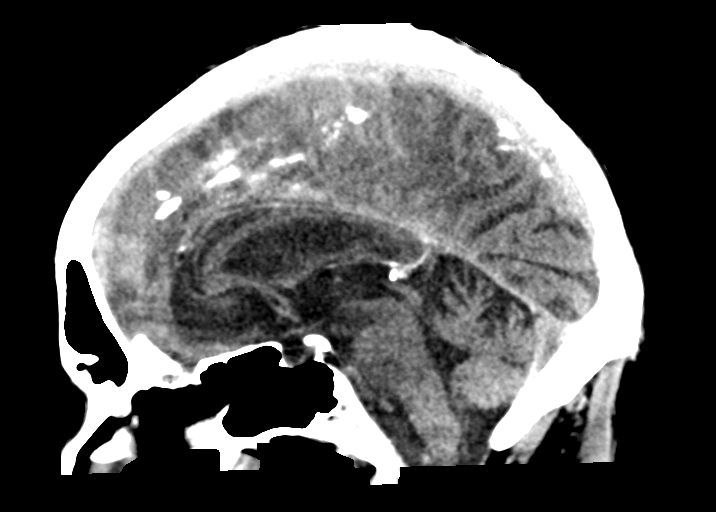
[im 39/58  brain]
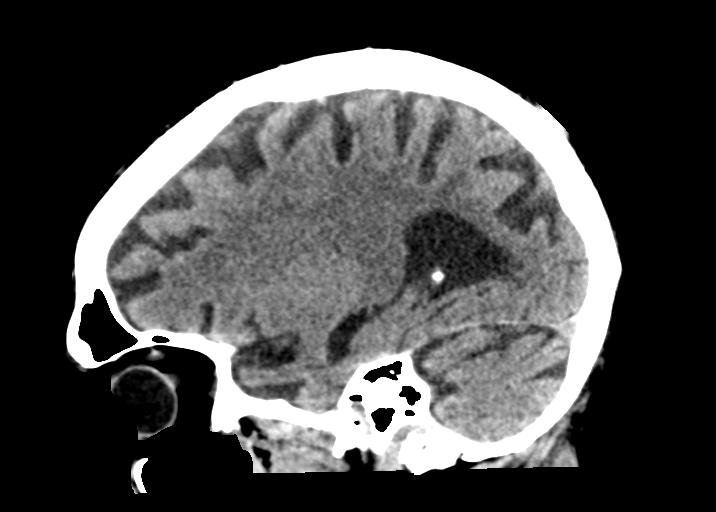

[16 of 47 positions shown; findings below may reference images not displayed]

FINDINGS: Brain: No evidence of acute territorial infarction, hemorrhage,
hydrocephalus,extra-axial collection or mass lesion/mass effect.
There is dilatation the ventricles and sulci consistent with
age-related atrophy. Low-attenuation changes in the deep white
matter consistent with small vessel ischemia.

Vascular: No hyperdense vessel or unexpected calcification.

Skull: The skull is intact. No fracture or focal lesion identified.

Sinuses/Orbits: The visualized paranasal sinuses and mastoid air
cells are clear. The orbits and globes intact.

Other: None
IMPRESSION: No acute intracranial abnormality.

Findings consistent with age related atrophy and chronic small
vessel ischemia

## 2022-04-19 NOTE — Progress Notes (Signed)
Subjective     Patient was seen in an assisted living facility ([facility]) in lieu of an office visit for the following reason:   Requires use of an assistive device in order to ambulate due to poor balance.    HPI:   Dillon Wood is seen today in follow up.  He is doing well and at baseline.      Review of Systems   Constitutional:  Negative for chills, fever, malaise/fatigue and weight loss.   Respiratory:  Negative for cough, shortness of breath and wheezing.    Cardiovascular:  Negative for chest pain and leg swelling.   Gastrointestinal:  Negative for constipation, diarrhea, nausea and vomiting.   Genitourinary:  Negative for dysuria, frequency and urgency.   Musculoskeletal:  Negative for falls.   Skin:  Negative for itching and rash.   Psychiatric/Behavioral:  Positive for memory loss.             Objective   Past Medical History:   Diagnosis Date    Closed fracture of left side of maxilla 04/03/2017    DVT (deep venous thrombosis)     Facial contusion, initial encounter 04/03/2017    Gastroesophageal reflux disease     Hyperlipemia     Left orbit fracture, closed, initial encounter 04/03/2017    PVD (peripheral vascular disease)      Past Surgical History:   Procedure Laterality Date    ABDOMINAL SURGERY      Hernia repair     Family History   Problem Relation Age of Onset    Heart disease Father     Stroke Father     Diabetes Maternal Aunt      Social History     Tobacco Use    Smoking status: Never     Passive exposure: Never    Smokeless tobacco: Never   Vaping Use    Vaping Use: Never used   Substance Use Topics    Alcohol use: Yes     Comment: 3 per week    Drug use: No     Allergies   Allergen Reactions    Penicillins      Disorientation       MEDICATIONS:     Current Outpatient Medications:     ampicillin (PRINCIPEN) 500 MG capsule, , Disp: , Rfl:     apixaban (ELIQUIS) 5 MG, Take 1 tablet (5 mg) by mouth every 12 (twelve) hours, Disp: , Rfl:     aspirin EC 81 MG EC tablet, Take 1 tablet (81 mg total) by  mouth daily, Disp: 30 tablet, Rfl: 1    Aspirin Low Dose 81 MG chewable tablet, , Disp: , Rfl:     atorvastatin (LIPITOR) 40 MG tablet, Take 1 tablet (40 mg total) by mouth daily, Disp: 90 tablet, Rfl: 3    clopidogrel (PLAVIX) 75 mg tablet, Take 1 tablet (75 mg) by mouth daily, Disp: , Rfl:     diclofenac Sodium (VOLTAREN) 1 % Gel topical gel, Apply 2 g topically 2 (two) times daily Apply a dime amount to left hand and wrist BID for pain and inflammation, Disp: 100 g, Rfl: 5    doxycycline (MONODOX) 100 MG capsule, , Disp: , Rfl:     finasteride (PROSCAR) 5 MG tablet, , Disp: , Rfl:     fluorouracil (EFUDEX) 5 % cream, , Disp: , Rfl:     imiquimod (ALDARA) 5 % cream, , Disp: , Rfl:     levoFLOXacin (LEVAQUIN)  750 MG tablet, Take 1 tablet (750 mg) by mouth every other day, Disp: , Rfl:     naloxone (EVZIO) 2 MG/0.4ML Solution Auto-injector injection, Use one auto-injector upon signs of opioid overdose. Call 911. May repeat x 1., Disp: 2 each, Rfl: 0    phenazopyridine (PYRIDIUM) 100 MG tablet, , Disp: , Rfl:     tamsulosin (FLOMAX) 0.4 MG Cap, Take 1 capsule (0.4 mg) by mouth Daily after dinner, Disp: , Rfl:     traMADol (ULTRAM) 50 MG tablet, Take 1 tablet (50 mg) by mouth every 6 (six) hours as needed for Pain Do not drive or operate machinery while taking this medication, Disp: 12 tablet, Rfl: 0    traMADol (ULTRAM) 50 MG tablet, Take 1 tablet (50 mg) by mouth 2 (two) times daily as needed for Pain, Disp: 180 tablet, Rfl: 1    Wound Dressings (Foam Dressing Circular Border) Pads, Apply 1 each topically daily, Disp: 30 each, Rfl: 5    PHYSICAL EXAM:   There were no vitals taken for this visit.   Wt Readings from Last 1 Encounters:   01/05/22 75.8 kg (167 lb)      Ht Readings from Last 1 Encounters:   09/22/21 1.778 m (5\' 10" )     Physical Exam  Constitutional:       General: He is not in acute distress.     Appearance: Normal appearance.   HENT:      Head: Normocephalic and atraumatic.      Right Ear: External ear  normal.      Left Ear: External ear normal.      Nose: Nose normal.      Mouth/Throat:      Mouth: Mucous membranes are dry.   Eyes:      Extraocular Movements: Extraocular movements intact.      Conjunctiva/sclera: Conjunctivae normal.   Pulmonary:      Effort: Pulmonary effort is normal. No respiratory distress.   Abdominal:      General: There is no distension.   Musculoskeletal:         General: No swelling. Normal range of motion.      Cervical back: Neck supple.   Skin:     General: Skin is warm.      Capillary Refill: Capillary refill takes less than 2 seconds.      Coloration: Skin is not jaundiced.   Neurological:      General: No focal deficit present.      Mental Status: He is alert.   Psychiatric:         Mood and Affect: Mood normal.       DIAGNOSTICS:     Lab Results   Component Value Date    WBC 7.67 04/02/2017    HGB 12.8 (L) 04/02/2017    HCT 37.0 (L) 04/02/2017    PLT 136 (L) 04/02/2017    NA 140 04/02/2017    K 3.5 04/02/2017    CL 108 04/02/2017    CREAT 1.2 04/02/2017    BUN 14 04/02/2017    CO2 23 04/02/2017    INR 1.1 04/02/2017    GLU 96 04/02/2017     No results found.         Plan   ASSESSMENT and PLAN:       Dillon Wood was seen today for urinary urgency.    Diagnoses and all orders for this visit:    Left plantar foot wound   - In healing  stages.  Patient continues to see wound clinic weekly and wear the boot.  He ambulates on it more frequently now.  Participating in ALF events.     Memory loss   - Unchanged.  Continue ALF care

## 2022-05-14 ENCOUNTER — Encounter (INDEPENDENT_AMBULATORY_CARE_PROVIDER_SITE_OTHER): Payer: Medicare Other | Admitting: Physician Assistant

## 2022-05-15 ENCOUNTER — Ambulatory Visit: Payer: Medicare Other | Admitting: Internal Medicine

## 2022-05-15 DIAGNOSIS — I82412 Acute embolism and thrombosis of left femoral vein: Secondary | ICD-10-CM

## 2022-05-15 DIAGNOSIS — I739 Peripheral vascular disease, unspecified: Secondary | ICD-10-CM

## 2022-05-15 DIAGNOSIS — S91302S Unspecified open wound, left foot, sequela: Secondary | ICD-10-CM

## 2022-05-15 NOTE — Progress Notes (Signed)
Date: 05/15/2022    Patient Name: Dillon Dillon Wood,Dillon Dillon Wood    Patient was seen in an assisted living facility ([facility]) in lieu of an office visit for the following reason:   Requires use of an assistive device in order to ambulate due to poor balance.        Code Status: FULL    HPI:   Dillon Dillon Wood is a [age] y.o. year old were are here to address chronic problems.    Problem   Open Wound of Plantar Aspect of Foot, Left, Sequela   Pvd (Peripheral Vascular Disease)    Sores on Dillon Wood foot.  Had Stent placed Dillon Wood artery.  Wound healed     Angiogram 2021:  Left lower extremity: Occlusion extending from the superficial femoral artery through the tibioperoneal trunk with reconstitution of the peroneal artery through collaterals and single vessel runoff through the foot.   2.       Right lower extremity: Multifocal stenoses as described with single vessel runoff to the foot through the peroneal artery.   3.       Possible component of basilar pulmonary fibrosis, though evaluation is compromised by respiratory motion. This can be further evaluated with nonemergent outpatient high-resolution chest CT.   4.       Course layering bladder calculi.   5.       Enhancement within the prostate is concerning for prostate cancer, further evaluation with PSA and consideration of MRI is advised to better assess.     RECOMMENDATIONS: No additional recommendations.     Preliminary report dictated by: Dillon Dillon Wood Preliminary Date: 04/05/2020 9:17 AM     I personally reviewed the image(s) and the report above and concur.     Final Signed by Dillon Dillon Wood Signed on 04/05/2020 11:30 AM       Acute Deep Vein Thrombosis (Dvt) of Femoral Vein of Left Lower Extremity    Small, non occlusive.  10/2020       Patient seen for follow up.  Feels very well and has no complaints.  Wound has healed on foot.  Denies foot pain    Eating well.  Regular bowel movements  Review of Systems    Past Medical History:   Diagnosis Date    Closed fracture of left side of  maxilla 04/03/2017    DVT (deep venous thrombosis)     Facial contusion, initial encounter 04/03/2017    Gastroesophageal reflux disease     Hyperlipemia     Left orbit fracture, closed, initial encounter 04/03/2017    PVD (peripheral vascular disease)      Past Surgical History:   Procedure Laterality Date    ABDOMINAL SURGERY      Hernia repair     Family History   Problem Relation Age of Onset    Heart disease Father     Stroke Father     Diabetes Maternal Aunt      Social History     Tobacco Use    Smoking status: Never     Passive exposure: Never    Smokeless tobacco: Never   Vaping Use    Vaping Use: Never used   Substance Use Topics    Alcohol use: Yes     Comment: 3 per week    Drug use: No     Allergies   Allergen Reactions    Penicillins      Disorientation       MEDICATIONS:     Current  Outpatient Medications:     apixaban (ELIQUIS) 5 MG, Take 1 tablet (5 mg) by mouth every 12 (twelve) hours, Disp: , Rfl:     aspirin EC 81 MG EC tablet, Take 1 tablet (81 mg total) by mouth daily, Disp: 30 tablet, Rfl: 1    atorvastatin (LIPITOR) 40 MG tablet, Take 1 tablet (40 mg total) by mouth daily, Disp: 90 tablet, Rfl: 3    tamsulosin (FLOMAX) 0.4 MG Cap, Take 1 capsule (0.4 mg) by mouth Daily after dinner, Disp: , Rfl:     traMADol (ULTRAM) 50 MG tablet, Take 1 tablet (50 mg) by mouth every 6 (six) hours as needed for Pain Do not drive or operate machinery while taking this medication (Patient taking differently: Take 1 tablet (50 mg) by mouth 2 (two) times daily as needed for Pain Do not drive or operate machinery while taking this medication), Disp: 12 tablet, Rfl: 0    naloxone (EVZIO) 2 MG/0.4ML Solution Auto-injector injection, Use one auto-injector upon signs of opioid overdose. Call 911. May repeat x 1., Disp: 2 each, Rfl: 0    PHYSICAL EXAM:   There were no vitals taken for this visit.   Wt Readings from Last 1 Encounters:   01/05/22 75.8 kg (167 lb)      Ht Readings from Last 1 Encounters:   09/22/21 1.778  m (5\' 10" )     Physical Exam  Constitutional:       General: He is not in acute distress.     Appearance: He is not ill-appearing, toxic-appearing or diaphoretic.   Cardiovascular:      Rate and Rhythm: Normal rate and regular rhythm.      Heart sounds: No murmur heard.  Pulmonary:      Effort: No respiratory distress.      Breath sounds: No wheezing, rhonchi or rales.   Skin:     Comments: No wound on Dillon Wood foot       DIAGNOSTICS:     Lab Results   Component Value Date    WBC 7.67 04/02/2017    HGB 12.8 (Dillon Wood) 04/02/2017    HCT 37.0 (Dillon Wood) 04/02/2017    PLT 136 (Dillon Wood) 04/02/2017    NA 140 04/02/2017    K 3.5 04/02/2017    CL 108 04/02/2017    CREAT 1.2 04/02/2017    BUN 14 04/02/2017    CO2 23 04/02/2017    INR 1.1 04/02/2017    GLU 96 04/02/2017     No results found.    ASSESSMENT and PLAN:   PVD (peripheral vascular disease)  A/P: Wound has healed and patient doing well.  On ASA and eliquis    Open wound of plantar aspect of foot, left, sequela  A/P: Wound has healed    Acute deep vein thrombosis (DVT) of femoral vein of left lower extremity  A/P: Remains on eliquis    No orders of the defined types were placed in this encounter.    Dillon Dillon Wood was seen today for follow-up, wound check and pvd.    Diagnoses and all orders for this visit:    PVD (peripheral vascular disease)    Open wound of plantar aspect of foot, left, sequela    Acute deep vein thrombosis (DVT) of femoral vein of left lower extremity        Electronically Signed by 06/02/2017, MD

## 2022-05-15 NOTE — Assessment & Plan Note (Addendum)
A/P: Wound has healed and patient doing well.  On ASA and eliquis

## 2022-05-15 NOTE — Assessment & Plan Note (Signed)
A/P: Wound has healed

## 2022-05-15 NOTE — Assessment & Plan Note (Signed)
A/P: Remains on eliquis.

## 2022-05-16 ENCOUNTER — Telehealth (INDEPENDENT_AMBULATORY_CARE_PROVIDER_SITE_OTHER): Payer: Self-pay | Admitting: Physician Assistant

## 2022-05-16 NOTE — Telephone Encounter (Signed)
Labs orders Faxed over to [facility].

## 2022-05-16 NOTE — Telephone Encounter (Signed)
-----   Message from Dutch Quint, MD sent at 05/15/2022  4:28 PM EST -----  Please fax lab orders to ALF

## 2022-05-18 NOTE — Telephone Encounter (Signed)
LPN spoke to RN Lurena Joiner who confirmed order receipt.

## 2022-06-06 ENCOUNTER — Encounter (INDEPENDENT_AMBULATORY_CARE_PROVIDER_SITE_OTHER): Payer: Self-pay | Admitting: Nurse Practitioner

## 2022-06-08 ENCOUNTER — Ambulatory Visit: Payer: Medicare Other | Admitting: Internal Medicine

## 2022-06-08 VITALS — BP 105/50 | HR 87

## 2022-06-08 DIAGNOSIS — I739 Peripheral vascular disease, unspecified: Secondary | ICD-10-CM

## 2022-06-08 DIAGNOSIS — S91302S Unspecified open wound, left foot, sequela: Secondary | ICD-10-CM

## 2022-06-09 NOTE — Assessment & Plan Note (Signed)
A/P: Dorsal lesion resolved but now with open wound at nail bed, great toe.  Declines wound RN as has follow up with wound MD next week.

## 2022-06-09 NOTE — Assessment & Plan Note (Signed)
A/P: dorsal lesion resolved but now with open sore near toenail.  Is seeing wound MD and declines any wound care from nursing at this time.  Is to see Wound MD next week and if not better at that time, will consider having wound RN come

## 2022-06-09 NOTE — Progress Notes (Signed)
Date: 06/08/2022    Patient Name: BURK, HOCTOR    Patient was seen in an assisted living facility ([facility]) in lieu of an office visit for the following reason:   Requires use of an assistive device in order to ambulate due to poor balance, weakness.        Code Status: FULL    HPI:   NOE GOYER is a 88 y.o. year old were are here to address chronic problems.    Problem   Open Wound of Plantar Aspect of Foot, Left, Sequela   Pvd (Peripheral Vascular Disease)    Sores on L foot.  Had Stent placed L artery.  Wound healed     Angiogram 2021:  Left lower extremity: Occlusion extending from the superficial femoral artery through the tibioperoneal trunk with reconstitution of the peroneal artery through collaterals and single vessel runoff through the foot.   2.       Right lower extremity: Multifocal stenoses as described with single vessel runoff to the foot through the peroneal artery.   3.       Possible component of basilar pulmonary fibrosis, though evaluation is compromised by respiratory motion. This can be further evaluated with nonemergent outpatient high-resolution chest CT.   4.       Course layering bladder calculi.   5.       Enhancement within the prostate is concerning for prostate cancer, further evaluation with PSA and consideration of MRI is advised to better assess.     RECOMMENDATIONS: No additional recommendations.     Preliminary report dictated by: Salomon Fick Preliminary Date: 04/05/2020 9:17 AM     I personally reviewed the image(s) and the report above and concur.     Final Signed by Ephriam Knuckles Signed on 04/05/2020 11:30 AM           Review of Systems    Past Medical History:   Diagnosis Date    Closed fracture of left side of maxilla 04/03/2017    DVT (deep venous thrombosis)     Facial contusion, initial encounter 04/03/2017    Gastroesophageal reflux disease     Hyperlipemia     Left orbit fracture, closed, initial encounter 04/03/2017    PVD (peripheral vascular disease)       Past Surgical History:   Procedure Laterality Date    ABDOMINAL SURGERY      Hernia repair     Family History   Problem Relation Age of Onset    Heart disease Father     Stroke Father     Diabetes Maternal Aunt      Social History     Tobacco Use    Smoking status: Never     Passive exposure: Never    Smokeless tobacco: Never   Vaping Use    Vaping Use: Never used   Substance Use Topics    Alcohol use: Yes     Comment: 3 per week    Drug use: No     Allergies   Allergen Reactions    Penicillins      Disorientation       MEDICATIONS:     Current Outpatient Medications:     apixaban (ELIQUIS) 5 MG, Take 1 tablet (5 mg) by mouth every 12 (twelve) hours, Disp: , Rfl:     aspirin EC 81 MG EC tablet, Take 1 tablet (81 mg total) by mouth daily, Disp: 30 tablet, Rfl: 1    atorvastatin (LIPITOR) 40  MG tablet, Take 1 tablet (40 mg total) by mouth daily, Disp: 90 tablet, Rfl: 3    niacinamide 500 MG tablet, Take 1 tablet (500 mg) by mouth 2 (two) times daily with meals, Disp: , Rfl:     tamsulosin (FLOMAX) 0.4 MG Cap, Take 1 capsule (0.4 mg) by mouth Daily after dinner, Disp: , Rfl:     traMADol (ULTRAM) 50 MG tablet, Take 1 tablet (50 mg) by mouth every 6 (six) hours as needed for Pain Do not drive or operate machinery while taking this medication (Patient taking differently: Take 1 tablet (50 mg) by mouth 2 (two) times daily as needed for Pain Do not drive or operate machinery while taking this medication), Disp: 12 tablet, Rfl: 0    naloxone (EVZIO) 2 MG/0.4ML Solution Auto-injector injection, Use one auto-injector upon signs of opioid overdose. Call 911. May repeat x 1. (Patient not taking: Reported on 06/09/2022), Disp: 2 each, Rfl: 0    PHYSICAL EXAM:   BP 105/50   Pulse 87   SpO2 99%    Wt Readings from Last 1 Encounters:   01/05/22 75.8 kg (167 lb)      Ht Readings from Last 1 Encounters:   09/22/21 1.778 m (5\' 10" )     Physical Exam  Constitutional:       General: He is not in acute distress.     Appearance: He  is not ill-appearing, toxic-appearing or diaphoretic.   Cardiovascular:      Rate and Rhythm: Normal rate.   Pulmonary:      Effort: No respiratory distress.      Breath sounds: No wheezing, rhonchi or rales.   Musculoskeletal:      Right lower leg: No edema.      Left lower leg: No edema.   Skin:     Comments: L great toe with open wound lateral to nail bed.  No evidence of infection.         DIAGNOSTICS:     Lab Results   Component Value Date    WBC 7.67 04/02/2017    HGB 12.8 (L) 04/02/2017    HCT 37.0 (L) 04/02/2017    PLT 136 (L) 04/02/2017    NA 140 04/02/2017    K 3.5 04/02/2017    CL 108 04/02/2017    CREAT 1.2 04/02/2017    BUN 14 04/02/2017    CO2 23 04/02/2017    INR 1.1 04/02/2017    GLU 96 04/02/2017     No results found.    ASSESSMENT and PLAN:   PVD (peripheral vascular disease)  A/P: dorsal lesion resolved but now with open sore near toenail.  Is seeing wound MD and declines any wound care from nursing at this time.  Is to see Wound MD next week and if not better at that time, will consider having wound RN come     Open wound of plantar aspect of foot, left, sequela  A/P: Dorsal lesion resolved but now with open wound at nail bed, great toe.  Declines wound RN as has follow up with wound MD next week.        No orders of the defined types were placed in this encounter.    Danuel was seen today for follow-up and wound check.    Diagnoses and all orders for this visit:    PVD (peripheral vascular disease)    Open wound of plantar aspect of foot, left, sequela  Electronically Signed by Rulon Abide, MD

## 2022-06-15 ENCOUNTER — Encounter (INDEPENDENT_AMBULATORY_CARE_PROVIDER_SITE_OTHER): Payer: Medicare Other | Admitting: Internal Medicine

## 2022-06-19 ENCOUNTER — Encounter (INDEPENDENT_AMBULATORY_CARE_PROVIDER_SITE_OTHER): Payer: Self-pay | Admitting: Nurse Practitioner

## 2022-07-10 ENCOUNTER — Ambulatory Visit: Payer: Medicare Other | Admitting: Student in an Organized Health Care Education/Training Program

## 2022-07-11 ENCOUNTER — Telehealth (INDEPENDENT_AMBULATORY_CARE_PROVIDER_SITE_OTHER): Payer: Self-pay | Admitting: Nurse Practitioner

## 2022-07-11 NOTE — Telephone Encounter (Signed)
[name] RN received email and then reached out to call and speak with Dillon Wood, patient's daughter. [phone]    Her father has psoriasis all over and needs a cream and for ALF staff to smooth it on his lower extremities.    Patient can assist by spreading it on both upper arms, stomach area.  He cannot bend over.    In addition, FYI - patient sees dermatologist at [facility] center on 1/22 since he has significant history of skin cancer on his head.    In addition, he has a wound on his foot, which he wears a boot covering it ordered by podiatrist Dr. [name].  The daughter, Dillon Wood, asked if PCP could look at it or check in with Dr. [name] to see how it is doing at her next visit on 07/20/22?  Patient has work the boot on right foot almost a year.    Daughter mentioned patient has some irritability sometimes, not his baseline, not sure the cause.  However, she is not in favor of adding medications to his regimen.  Some minor memory issues.    Would like on PCP appointment 1/26 to have referral for him to receive PT and OT if appropriate. He uses a motor scooter in ALF and his muscles are weak.    This week patient is receiving orthotics and special shoes which hopefully help his wound and mobility challenges.        He has

## 2022-07-16 ENCOUNTER — Ambulatory Visit: Payer: Medicare Other | Admitting: Student in an Organized Health Care Education/Training Program

## 2022-07-16 ENCOUNTER — Encounter: Payer: Self-pay | Admitting: Student in an Organized Health Care Education/Training Program

## 2022-07-16 ENCOUNTER — Other Ambulatory Visit: Payer: Self-pay

## 2022-07-16 ENCOUNTER — Ambulatory Visit
Payer: Medicare Other | Attending: Student in an Organized Health Care Education/Training Program | Admitting: Student in an Organized Health Care Education/Training Program

## 2022-07-16 VITALS — BP 117/71 | HR 90 | Temp 97.9°F | Ht 70.0 in | Wt 168.7 lb

## 2022-07-16 DIAGNOSIS — C760 Malignant neoplasm of head, face and neck: Secondary | ICD-10-CM

## 2022-07-16 NOTE — Progress Notes (Signed)
[facility]       Patient Name: Dillon Wood   Referring Provider: Referring, Not On File,*    Primary Dx:   1. Squamous cell carcinoma of head and neck           ONCOLOGY CARE TEAM  Mohs Surgery: Rosey Bath  Dermatology: Doran Durand, MD   Derm Onc:  Lily Lovings, MD  Radiation Oncology:  Theadora Rama, MD ([facility])      HISTORY OF PRESENT ILLNESS  Dillon Wood is 89 y.o. 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     Recently moved into assisted living facility with his wife from [place]. When he relocated here, he was seen by his geriatrics team 2/24, at which time the patient's family member noted worsening memory loss over the preceding months, with episodes of agitation. Family expressed concern that this cancer could have spread to his brain. 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 He was previously lined up to pursue endarterectomy in 05/2021 but family changed their minds at the last minute.    Subjective:  Presents today for follow up. Accompanied by his wife. Since his last visit, he has been feeling well. He has several scattered lesions on his scalp and face that could be suspicious for skin malignancy/lesions. He has not been following up with dermatology. No areas of concern near the previous high risk cSCC. No mention of worsening dementia symptoms/agitation by his wife.    Oncology History   Squamous cell carcinoma of head and neck   08/23/2020 Initial Diagnosis    Squamous cell carcinoma of the scalp     09/14/2020 Surgery    Moh's resection (Dr. Rosey Bath, [facility],  [place])  Size: 2.5cm  Invasion: 5.34mm, through subQ fat  PNI: + (maximum caliber 0.43mm)     BWH T2b     10/05/2020 - 11/01/2020 Radiation    50 Gy in 20 fractions (Dr. Theadora Rama, [facility], [place])     09/20/2021 Cancer Staged    Staging form: Cutaneous Carcinoma of the Head and Neck, AJCC 8th Edition  - Clinical stage from 09/20/2021: Stage III (cT3, cN0, cM0) - Signed by Algis Greenhouse, MD on 09/20/2021           Allergies   Allergen Reactions    Penicillins      Disorientation     Current Outpatient Medications   Medication Sig Dispense Refill    apixaban (ELIQUIS) 5 MG Take 1 tablet (5 mg) by mouth every 12 (twelve) hours      aspirin EC 81 MG EC tablet Take 1 tablet (81 mg total) by mouth daily 30 tablet 1    atorvastatin (LIPITOR) 40 MG tablet Take 1 tablet (40 mg total) by mouth daily 90 tablet 3    naloxone (EVZIO) 2 MG/0.4ML Solution Auto-injector injection Use one auto-injector upon signs of opioid overdose. Call 911. May repeat x 1. (Patient not taking: Reported on 06/09/2022) 2 each 0    niacinamide 500 MG tablet Take 1 tablet (500 mg) by mouth 2 (two) times daily with meals  tamsulosin (FLOMAX) 0.4 MG Cap Take 1 capsule (0.4 mg) by mouth Daily after dinner      traMADol (ULTRAM) 50 MG tablet Take 1 tablet (50 mg) by mouth every 6 (six) hours as needed for Pain Do not drive or operate machinery while taking this medication (Patient taking differently: Take 1 tablet (50 mg) by mouth 2 (two) times daily as needed for Pain Do not drive or operate machinery while taking this medication) 12 tablet 0     No current facility-administered medications for this visit.     Past Medical History:   Diagnosis Date    Closed fracture of left side of maxilla 04/03/2017    DVT (deep venous thrombosis)     Facial contusion, initial encounter 04/03/2017    Gastroesophageal reflux disease     Hyperlipemia     Left orbit fracture, closed, initial encounter 04/03/2017    PVD (peripheral vascular disease)      Past  Surgical History:   Procedure Laterality Date    ABDOMINAL SURGERY      Hernia repair     Family History   Problem Relation Age of Onset    Heart disease Father     Stroke Father     Diabetes Maternal Aunt      Social History     Socioeconomic History    Marital status: Married    Number of children: 4   Occupational History    Occupation: Associate Professor of schools   Tobacco Use    Smoking status: Never     Passive exposure: Never    Smokeless tobacco: Never   Vaping Use    Vaping Use: Never used   Substance and Sexual Activity    Alcohol use: Yes     Comment: 3 per week    Drug use: No     Review of Systems:  Negative except as noted above    Objective:     Vitals:    07/16/22 1425   BP: 117/71   Pulse: 90   Temp: 97.9 F (36.6 C)   SpO2: 95%     ECOG PS: 1-2  General: NAD  Eyes: Anicteric, normal conjunctiva  ENT: Poor hearing  CV: RRR  RESP: CTA B  GI: NT/ND, no HSM  Neuro: AAO x 3  MS: no swelling in arms or legs  SKIN:   - left posterior vertex scalp 6 cm well healed scar   - right vertex is curvilinear scar 6 cm left temple graft site.   - extensive actinic damage and AK vs cSCC on anterior scalp, forehead, pre-auricular skin, cheeks and arms  LYMPH: no cervical suplarclavicular axillary lymphadenopathy    LAB DATA:   Lab Results   Component Value Date    WBC 7.67 04/02/2017    HGB 12.8 (L) 04/02/2017    HCT 37.0 (L) 04/02/2017    MCV 90.9 04/02/2017    PLT 136 (L) 04/02/2017     Lab Results   Component Value Date    CREAT 1.2 04/02/2017    BUN 14 04/02/2017    NA 140 04/02/2017    K 3.5 04/02/2017    CL 108 04/02/2017    CO2 23 04/02/2017     No results found for: "ALT", "AST", "GGT", "ALKPHOS", "BILITOTAL"    RADIOLOGY DATA:   MRI Brain w/ and w/o 10/12/21  FINDINGS:   There are no abnormal fluid collections. There are no masses. There is  no mass effect or midline  shift. Ventricles and sulci are  age-appropriate. Chronic ischemic changes are present.  Diffusion-weighted imaging is unremarkable. There is no  evidence of  acute infarction. Scattered foci of hemosiderin deposition are present  secondary to chronic microhemorrhage. The gray-white matter junctions  are unremarkable. Clival and calvarial marrow signal is normal. Upon the  administration of intravenous contrast, there is no abnormal  enhancement.     There appears to be a 1.1 cm lesion in the right parotid gland deep  lobe.    IMPRESSION:   1. No acute intracranial process.  2. No mass, hydrocephalus, or pathologic fluid collection.  3. No acute infarct.  4. Chronic ischemic changes and age-appropriate volume are present.   5. There is a 1.1 cm lesion in the right parotid gland deep lobe. Its  imaging characteristics suggest a benign cyst however the lesion is  nonspecific      MRA Neck 10/12/21  IMPRESSION:   1.Stable MRA neck.  2.Again present is a greater than 90% stenosis of the right carotid bulb  by NASCET criteria. Extensive atherosclerotic disease throughout the  remainder of the cervical right ICA is noted with multifocal moderate  grade stenoses.  3.There is 62% stenosis of the proximal left internal carotid artery  based on NASCET criteria.  4.The vertebral arteries remain patent      PATHOLOGY DATA:  Final Diagnosis      A. Skin, biopsy, left vertex scalp:  -  SQUAMOUS CELL CARCINOMA, WELL DIFFERENTIATED      B. Skin, biopsy, left forearm:  -  SQUAMOUS CELL CARCINOMA, WELL DIFFERENTIATED (ACANTHOLYTIC)      Electronically signed by Dorthula Rue, MD on 08/16/2020 at 12:57 PM         Final Diagnosis      A. Skin, Mohs debulk, left vertex scalp:  -  SQUAMOUS CELL CARCINOMA, WELL DIFFERENTIATED     NOTE: The tumor invades to a depth of at least 5.5 mm and is present at the base of the specimen. There is perineural invasion, with maximum nerve caliber 0.09 mm. No lymphovascular invasion is seen.      B. Skin, Mohs stage 1, left vertex scalp:  -  SQUAMOUS CELL CARCINOMA, WELL DIFFERENTIATED     NOTE: No perineural or lymphovascular invasion is  seen.          Assessment & Plan:     1. 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 Will arrange for him to see Dr. Ronnie Derby.  - We discussed previously that brain metastases are almost never seen with this disease, and certainly not in the absence of other widespread metastatic disease. Local recurrences are common however, and in the worst cases invasion into the skull may be seen, but this should present with pain rather than neurocognitive changes. This has been worked up as above  - Will order one final PET/CT to complete surveillance imaging, ordered today    2. Worsening memory loss, agitation (improving)  - In the setting of a long term neurocognitive decline c/w dementia, which predates the cancer  - More likely is that he is experiencing stepwise decline  in his neurocognitive status, in a pattern more keeping with vascular dementia, which can have these more sudden drops in functioning. As with all dementia, it may be prone to moments of delirium and sundowning.   - I explained that scalp radiation can sometimes cause radiation scatter to the brain parenchyma itself, which can be symptomatic, however this was not seen on MRI Brain 10/12/21  - MRA neck 10/12/21 re-demonstrating severe R carotid stenosis. I explained that he is at high risk of recurrent strokes, silent or symptomatic, but the risks of surgery at his age and level of debility would be considerable. Defer to vascular surgery.  - F/U with geriatrics for ongoing support    3. 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 Sherlean Foot  - Consider field therapy (eg effudex, blue light) for heavy burden of diffuse AK  - Derm surveillance as above    RTC in 4wks for scan review      Rainey Pines, MD  PGY2- IM resident   [facility]  [id number]  [place], [place] [address]    ATTENDING ATTESTATION:  I have personally interviewed and examined the patient.  I have reviewed the provider's history, exam, assessment and management plans. I concur with or have edited all elements of the provider's note. We spent in the care of this patient, over 50% of which was dedicated to counseling and coordination of care. Discussions included the diagnosis, prognosis and treatment options for this disease.      Jafar Al-Mondhiry, MD, MA  Medical Oncologist, Cutaneous Malignancies Program  Assistant Professor of Medical Education, [employer]  [facility]  [id number]  [place], [place] [address]  Phone: [phone]  Fax: [phone]

## 2022-07-19 NOTE — Progress Notes (Signed)
Date: 07/20/2022    Patient Name: Dillon Wood, Dillon Wood    Patient was seen in an assisted living facility ([facility]) in lieu of an office visit for the following reason:   Requires use of an assistive device in order to ambulate due to poor balance, weakness.        Code Status: FULL    HPI:   Dillon Wood is a 89 y.o. year old were are here to address chronic problems.    Seen at their home, alert and responsive. Calm and cooperative. No problem recalling long term memory. He had a vivid recollection on how he met his wife, which the wife verified. He is forgetful.    He denies pain. Not taking tramadol.  Agreed to stop it.    He has chronic skin lesions on his face/scalp with hx of skin CA. Has an appointment at [facility] for skin CA on 07/23/22 with Dr. Lily Lovings. He also has lesions on LLE, mostly on lateral area > medial, lateral knee, L. Non tender, non erythematous, thick actinic lesions. Discussed to defer all treatment to- Dr. Sherlean Foot for now, agreed.    Further skin assessment showed stable scab to L foot, medial adjacent to big toe and plantar area below Big toe.    He has ortho boot for his L foot.     GOC/ACP discussed. He wants to remain full code.      Review of Systems    Past Medical History:   Diagnosis Date    Closed fracture of left side of maxilla 04/03/2017    DVT (deep venous thrombosis)     Facial contusion, initial encounter 04/03/2017    Gastroesophageal reflux disease     Hyperlipemia     Left orbit fracture, closed, initial encounter 04/03/2017    PVD (peripheral vascular disease)      Past Surgical History:   Procedure Laterality Date    ABDOMINAL SURGERY      Hernia repair     Family History   Problem Relation Age of Onset    Heart disease Father     Stroke Father     Diabetes Maternal Aunt      Social History     Tobacco Use    Smoking status: Never     Passive exposure: Never    Smokeless tobacco: Never   Vaping Use    Vaping Use: Never used   Substance Use Topics    Alcohol use:  Yes     Comment: 3 per week    Drug use: No     Allergies   Allergen Reactions    Penicillins      Disorientation       MEDICATIONS:     Current Outpatient Medications:     apixaban (ELIQUIS) 5 MG, Take 1 tablet (5 mg) by mouth every 12 (twelve) hours, Disp: , Rfl:     aspirin EC 81 MG EC tablet, Take 1 tablet (81 mg total) by mouth daily, Disp: 30 tablet, Rfl: 1    atorvastatin (LIPITOR) 40 MG tablet, Take 1 tablet (40 mg total) by mouth daily, Disp: 90 tablet, Rfl: 3    naloxone (EVZIO) 2 MG/0.4ML Solution Auto-injector injection, Use one auto-injector upon signs of opioid overdose. Call 911. May repeat x 1. (Patient not taking: Reported on 06/09/2022), Disp: 2 each, Rfl: 0    niacinamide 500 MG tablet, Take 1 tablet (500 mg) by mouth 2 (two) times daily with meals, Disp: ,  Rfl:     tamsulosin (FLOMAX) 0.4 MG Cap, Take 1 capsule (0.4 mg) by mouth Daily after dinner, Disp: , Rfl:     traMADol (ULTRAM) 50 MG tablet, Take 1 tablet (50 mg) by mouth every 6 (six) hours as needed for Pain Do not drive or operate machinery while taking this medication (Patient not taking: Reported on 07/20/2022), Disp: 12 tablet, Rfl: 0    PHYSICAL EXAM:   BP 110/60   Pulse 65   Temp 98.5 F (36.9 C)   SpO2 94%    Wt Readings from Last 1 Encounters:   07/16/22 76.5 kg (168 lb 11.2 oz)      Ht Readings from Last 1 Encounters:   07/16/22 1.778 m (5\' 10" )     Physical Exam  Constitutional:       General: He is not in acute distress.     Appearance: Normal appearance. He is not ill-appearing, toxic-appearing or diaphoretic.   HENT:      Head: Normocephalic and atraumatic.   Cardiovascular:      Rate and Rhythm: Normal rate.   Pulmonary:      Effort: Pulmonary effort is normal. No respiratory distress.      Breath sounds: Normal breath sounds. No wheezing, rhonchi or rales.   Abdominal:      General: Bowel sounds are normal.      Palpations: Abdomen is soft.   Musculoskeletal:      Right lower leg: No edema.      Left lower leg: No edema.    Skin:     Comments: L great toe with open wound lateral to nail bed.  No evidence of infection.      Actinic lesions on the scalp/face.   Neurological:      General: No focal deficit present.      Mental Status: He is alert. Mental status is at baseline.       DIAGNOSTICS:     Lab Results   Component Value Date    WBC 7.67 04/02/2017    HGB 12.8 (L) 04/02/2017    HCT 37.0 (L) 04/02/2017    PLT 136 (L) 04/02/2017    NA 140 04/02/2017    K 3.5 04/02/2017    CL 108 04/02/2017    CREAT 1.2 04/02/2017    BUN 14 04/02/2017    CO2 23 04/02/2017    INR 1.1 04/02/2017    GLU 96 04/02/2017     No results found.    ASSESSMENT and PLAN:   No problem-specific Assessment & Plan notes found for this encounter.      Orders Placed This Encounter   Procedures    Home Health Face to Face Enc/Home Health     Please send orders to appropriate agency..     Order Specific Question:   Date I saw the patient face-to-face:     Answer:   07/20/2022     Order Specific Question:   Medical conditions that necessitate Home Health care:     Answer:   D.  Chronic illness & risk for re-hospitalization due to unstable disease status     Order Specific Question:   Clinical findings that support the need for Skilled Nursing. SN will:     Answer:   C. Monitor for signs and symptoms of exacerbation of disease and management     Order Specific Question:   Clinical findings that support the need for Physical Therapy. PT will     Answer:  D.  Provide services to help restore function, mobility, and releive pain     Order Specific Question:   Clinical findings that support the need for SLP. ST will     Answer:   F.  N/A     Order Specific Question:   Per clinical findings, following services are medically necessary:     Answer:   PT     Order Specific Question:   Per clinical findings, following services are medically necessary:     Answer:   OT     Order Specific Question:   Evidence this patient is homebound because:     Answer:   F.  Deconditioned due  to advance disease process requiring assistance to leave home     Dillon Wood was seen today for follow-up.    Diagnoses and all orders for this visit:    Acute deep vein thrombosis (DVT) of femoral vein of left lower extremity: On eliquis 5 mg q 12 hrs.    PVD (peripheral vascular disease)    Neuropathy: controlled.    Eczematous/actinic skin lesions: Appointment at [facility] on 07/23/22, Dr. Exie Parody.    Gait instability: Will order PT/OT, per daughter's request.    Generalized muscle weakness: PT/OT.    Frequent falls: PT/OT.    ACP (advance care planning): He wants to remain full code. POST form signed.    He was seen by Dr. [name], podiatrist who prescribed him the ortho boot.     D/C tramadol and narcan prn.      Electronically Signed by Ahmed Prima, NP

## 2022-07-20 ENCOUNTER — Telehealth: Payer: Self-pay

## 2022-07-20 ENCOUNTER — Encounter (INDEPENDENT_AMBULATORY_CARE_PROVIDER_SITE_OTHER): Payer: Self-pay | Admitting: Nurse Practitioner

## 2022-07-20 ENCOUNTER — Ambulatory Visit: Payer: Medicare Other | Admitting: Nurse Practitioner

## 2022-07-20 VITALS — BP 110/60 | HR 65 | Temp 98.5°F

## 2022-07-20 DIAGNOSIS — I739 Peripheral vascular disease, unspecified: Secondary | ICD-10-CM

## 2022-07-20 DIAGNOSIS — R2681 Unsteadiness on feet: Secondary | ICD-10-CM

## 2022-07-20 DIAGNOSIS — M6281 Muscle weakness (generalized): Secondary | ICD-10-CM

## 2022-07-20 DIAGNOSIS — L989 Disorder of the skin and subcutaneous tissue, unspecified: Secondary | ICD-10-CM

## 2022-07-20 DIAGNOSIS — R296 Repeated falls: Secondary | ICD-10-CM

## 2022-07-20 DIAGNOSIS — Z7189 Other specified counseling: Secondary | ICD-10-CM

## 2022-07-20 DIAGNOSIS — G629 Polyneuropathy, unspecified: Secondary | ICD-10-CM

## 2022-07-20 DIAGNOSIS — I82412 Acute embolism and thrombosis of left femoral vein: Secondary | ICD-10-CM

## 2022-07-20 NOTE — Telephone Encounter (Signed)
Called patient to review upcoming PET/CT at 08/10/22 at 9:30 AM at [facility].  Daughter picked up and requested to be called in ~30 minutes.    Called daughter back as requested; daughter did not pick up; left voicemail and will monitor triage line for her call back to review PET/CT details.

## 2022-07-23 ENCOUNTER — Telehealth (INDEPENDENT_AMBULATORY_CARE_PROVIDER_SITE_OTHER): Payer: Self-pay | Admitting: Nurse Practitioner

## 2022-07-23 ENCOUNTER — Encounter: Payer: Self-pay | Admitting: Dermatology

## 2022-07-23 ENCOUNTER — Ambulatory Visit: Payer: Medicare Other | Attending: Dermatology | Admitting: Dermatology

## 2022-07-23 VITALS — BP 98/66 | HR 91 | Temp 97.6°F | Resp 14 | Ht 70.0 in | Wt 162.8 lb

## 2022-07-23 DIAGNOSIS — L570 Actinic keratosis: Secondary | ICD-10-CM | POA: Insufficient documentation

## 2022-07-23 DIAGNOSIS — Z85828 Personal history of other malignant neoplasm of skin: Secondary | ICD-10-CM | POA: Insufficient documentation

## 2022-07-23 DIAGNOSIS — L308 Other specified dermatitis: Secondary | ICD-10-CM | POA: Insufficient documentation

## 2022-07-23 DIAGNOSIS — Z1283 Encounter for screening for malignant neoplasm of skin: Secondary | ICD-10-CM | POA: Insufficient documentation

## 2022-07-23 MED ORDER — TRIAMCINOLONE ACETONIDE 0.1 % EX OINT
TOPICAL_OINTMENT | CUTANEOUS | 4 refills | Status: AC
Start: 2022-07-23 — End: ?

## 2022-07-23 MED ORDER — FLUOROURACIL 5 % EX CREA
TOPICAL_CREAM | CUTANEOUS | 4 refills | Status: DC
Start: 2022-07-23 — End: 2022-09-14

## 2022-07-23 NOTE — Telephone Encounter (Signed)
-----  Message from Ahmed Prima, NP sent at 07/20/2022  3:55 PM EST -----  Dillon Wood Med pool: Pls send PT/OT order. Might be more convenient if he goes to facility's gym.    Geri Front: Pls upload POST form.

## 2022-07-23 NOTE — Telephone Encounter (Signed)
PT/OT referral order, PCP visit note faxed to [facility] nurse at [facility] and she will forward to onsite PT/OT dept.

## 2022-07-23 NOTE — Progress Notes (Signed)
[facility]        CC: follow up, History of SCC    HISTORY OF PRESENT ILLNESS  Dillon Wood is 89 y.o. male who comes in accompanied by wife for a follow up and FBSE. He was last seen by me in 3/23 and declined a FBSE at that time. He has not seen Dr. Samuel Germany, his prior dermatologist since for a skin check. His daughter is on the phone today as well to listen and ask questions as she states both her parents have memory issues.         . 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 He is also seeing Dr. Aileen Fass every 58months - last imaging 4/23 WNL.     He had numerous AKs on his scalp, and states was given topical 5FU and calicpotriene to use to his face/scalp. He hasn't used anything in a while.     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 s/p Mohs with Dr Ivette Loyal, 5/21 in [place]    Reports history multiple NMSC's over the years    Current Outpatient Medications   Medication Sig Dispense Refill    apixaban (ELIQUIS) 5 MG Take 1 tablet (5 mg) by mouth every 12 (twelve) hours      aspirin EC 81 MG EC tablet Take 1 tablet (81 mg total) by mouth daily 30 tablet 1    atorvastatin (LIPITOR) 40 MG tablet Take 1 tablet (40 mg total) by mouth daily 90 tablet 3    fluorouracil (Efudex) 5 % cream Apply BID to scalp, forehead, sides of cheeks, ears x 2-4 weeks, expect irritation 40 g 4    niacinamide 500 MG tablet Take 1 tablet (500 mg) by mouth 2 (two) times daily with meals      tamsulosin (FLOMAX) 0.4 MG Cap Take 1 capsule (0.4 mg) by mouth Daily after dinner      triamcinolone (KENALOG) 0.1 % ointment Apply bid to eczema on lower body until clear, then use PRN  80 g 4     No current facility-administered medications for this visit.     Allergies   Allergen Reactions    Penicillins      Disorientation     Past Medical History:   Diagnosis Date    Closed fracture of left side of maxilla 04/03/2017    DVT (deep venous thrombosis)     Facial contusion, initial encounter 04/03/2017    Gastroesophageal reflux disease     Hyperlipemia     Left orbit fracture, closed, initial encounter 04/03/2017    PVD (peripheral vascular disease)      Past Surgical History:   Procedure Laterality Date    ABDOMINAL SURGERY      Hernia repair     Family History   Problem Relation Age of Onset    Heart disease Father     Stroke Father     Diabetes Maternal Aunt      Social History     Socioeconomic History    Marital status: Married    Number of children: 4   Occupational History    Occupation: Associate Professor of schools   Tobacco Use  Smoking status: Never     Passive exposure: Never    Smokeless tobacco: Never   Vaping Use    Vaping Use: Never used   Substance and Sexual Activity    Alcohol use: Yes     Comment: 3 per week    Drug use: No       REVIEW OF SYSTEMS  Const - no fever no chills   Resp- no cough   Neuro - no headache   Abd - no pain   Integ - see HPI     All other systems reviewed and negative    Objective:     Vitals:    07/23/22 1001   BP: 98/66   Pulse:    Resp:    Temp:    SpO2:      Note.  Constitutional: General appearance: NAD, conversant    HEENT: normocephalic, atraumatic. Conjunctiva, lids, lips, teeth, gums and oropharynx examined with no suspicious lesions    SKIN:A total body skin examination is completed including:   - palpation of scalp and inspection of hair of scalp, eyebrows, face, chest and extremities   - inspection of eccrine and apocrine glands.   - inspection and/or palpation of the skin and subcutaneous tissues with the following anatomic skin sites examined with the following findings:   Head including face -   L posterior vertex scalp 6 cm well healed scar -  NER  R vertex curvilinear scar 6 cm left temple graft site  Extensive, TNTC, AKs all over the scalp/forehead/temples/lateral cheeks/ears, cryotherapy x 20 lesions  Neck  - no lesions of concern  Chest including breasts, and axillae - no lesions of concern  Abdomen - no lesions of concern  Genitalia, groin, buttocks - no lesions of concern  Back - no lesions of concern  Right upper extremity - no lesions of concern  Left upper extremity - no lesions of concern  Right lower extremity -asteatotic dermatitis on the BL LEs  Left lower extremity - asteatotic dermatitis on the BL LEs     - Dermatoscopy was used to evaluate the nevi.   - General skin: multiple brown macules, size 2-16mm, approximately 100 in number, distributed primarily on the trunk. Extensive photodamage and rhytids    LYMPHATIC:Examination of lymph node basins is completed, including the H and N, axilla and groin. There is no lymphadenopathy.    NECK: supple, no masses    CVS: no swelling, varicosities or edema    EXTREMITIES: inspection and palpation of digits and nails revealed no abnormalities    GI:  External anus without suspicious lesions    NEURO/PSYCH: alert and oriented, pleasant       Pathology reports  from care everywhere  Final Diagnosis     A. Skin, biopsy, left vertex scalp:  -  SQUAMOUS CELL CARCINOMA, WELL DIFFERENTIATED      B. Skin, biopsy, left forearm:  -  SQUAMOUS CELL CARCINOMA, WELL DIFFERENTIATED (ACANTHOLYTIC)      Electronically signed by Dorthula Rue, MD on 08/16/2020 at 12:57 PM       Final Diagnosis     A. Skin, Mohs debulk, left vertex scalp:  -  SQUAMOUS CELL CARCINOMA, WELL DIFFERENTIATED     NOTE: The tumor invades to a depth of at least 5.5 mm and is present at the base of the specimen. There is perineural invasion, with maximum nerve caliber 0.09 mm. No lymphovascular invasion is seen.      B. Skin, Mohs stage  1, left vertex scalp:  -  SQUAMOUS CELL CARCINOMA, WELL DIFFERENTIATED     NOTE: No perineural or  lymphovascular invasion is seen.       Electronically signed by Dorthula Rue, MD on 09/05/2020 at  4:19 PM       Procedure: Cryotherapy to 20 AK's  Obtained verbal consent to perform cryotherapy after discussing benefits, risk of developing a blister, crust or scab and possible need for multiple treatments, recurrence/ persistence of lesion, risk of scar or keloid, and/ or developing permanent brown or white spots. Liquid nitrogen applied. Patient tolerated the procedure well and was provided wound care instructions.    Assessment & Plan:   1. 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 Aileen Fass - had PET/CT scheduled for this month    2. 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   - cryotherapy to 20 lesions as above    - once healed, topical 5FU bid x 2-4 weeks, instructions printed out for nursing home     5. Asteatotic dermatitis on the lower legs   - dry skin care reviewed in detail, needs vaseline daily   - rx for triamcinolone ointment to use BID until clear    6. Skin cancer screening   - total body skin examination performed today   - no lesions concerning to warrant biopsy today  - Several have been documented as above and will be followed  - Sun protection reviewed  - ABCDEs of melanoma  reviewed  - Monthly self-skin examination was advised and the patient was instructed to return to clinic prior to scheduled visit if any suspicious or worrisome findings are discovered      4. Focused skin exam neck up  - No lesions suspicious for malignancy  - Sun protection reviewed  - ABCDEs of melanoma reviewed  - Monthly self-skin examination was advised and the patient was instructed to return to clinic prior to scheduled visit if any suspicious or worrisome findings are discovered    FU 6 months    Exie Parody, MD, FAAD  [facility]  [facility]   [address]  [place], [place] [id number]  T [phone]  F [phone]             30 mins of time was spent in total preparing to see the patient, reviewing prior notes, performing the full body skin and lymph node examination, using dermoscopy to evaluate skin lesions, ordering medications/tests/procedures (such as skin biopsies or cryotherapy), counseling and educating the patient on sun protection and the importance of self-skin examination,  documenting the clinical information and coordinating future care and follow up.

## 2022-07-23 NOTE — Patient Instructions (Addendum)
Pt had a lot of precancers frozen on his face/ears/scalp today. Please apply vaseline BID x 2 weeks to help them heal.   After 2 weeks, please apply topical efudex (5-fluorouracil) to entire scalp, forehead, sides of face, tops of ears (avoid areas around mouth, nose, eyes) x 4 weeks. Expect irritation. If there is any blisters or crusting, please take a break and apply vaseline.   He has eczema on his lower legs, please apply triamcinolone ointment BID to lower legs until better, then switch to vaseline daily  He will follow up with [place] in 6 months for a skin check       Directions for 5-fluorouracil creams to treat sun damage  1. apply a thin layer to the entire scalp and face and ears, avoiding area around the eyes.  2.  Apply the cream every morning.    3.  Wash the area and apply again before bed.  4.  Continue twice daily applications for 2-4 weeks  5. Expected side effects include redness, crusting, scabbing, inflammation. Stop and call office during treatment for any pain, blisters.  6. I will prescribe a topical steroid as a rescue medication (triamcinolone ointment twice daily for 4 days) to use as a rescue medication that you may use if needed after treatment or if you have to stop treatment early due to side effects.         Please call our office it the creams are too expensive or you have any questions regarding your treatment [phone])

## 2022-07-26 NOTE — Telephone Encounter (Addendum)
[facility] RN sent fax to [facility] of Dr. Ruel Favors instructions on pages 5 -7 of visit note 07/23/22.    On cover sheet -provided Dr. Ruel Favors # for questions.    [facility] fax [phone]    Fax machine confirms transmission.

## 2022-08-10 ENCOUNTER — Telehealth (INDEPENDENT_AMBULATORY_CARE_PROVIDER_SITE_OTHER): Payer: Self-pay | Admitting: Nurse Practitioner

## 2022-08-10 ENCOUNTER — Encounter (INDEPENDENT_AMBULATORY_CARE_PROVIDER_SITE_OTHER): Payer: Self-pay | Admitting: Nurse Practitioner

## 2022-08-10 ENCOUNTER — Other Ambulatory Visit: Payer: Self-pay

## 2022-08-10 ENCOUNTER — Other Ambulatory Visit (INDEPENDENT_AMBULATORY_CARE_PROVIDER_SITE_OTHER): Payer: Self-pay | Admitting: Student in an Organized Health Care Education/Training Program

## 2022-08-10 ENCOUNTER — Encounter: Payer: Self-pay | Admitting: Internal Medicine

## 2022-08-10 DIAGNOSIS — C760 Malignant neoplasm of head, face and neck: Secondary | ICD-10-CM

## 2022-08-10 NOTE — Telephone Encounter (Signed)
Daughter reports that pt's medication is being applied inappropriately resulting in "tearing up his face"   Pt was prescribed flouoracil 5% cream to be applied BID to scalp, sides of cheek and ears.  Per physician note the cream was to be applied after skin healed following cryotherapy treatment on 1/29   ALF was not notified of the need to delay application until face was healed and has been applying cream for multiple weeks as prescribed.   PCP gave verbal order to hold fluouricil 5% until pt able to get assessed by dermatology.    Nursing order faxed to ALF.

## 2022-08-14 ENCOUNTER — Ambulatory Visit
Payer: Medicare Other | Attending: Student in an Organized Health Care Education/Training Program | Admitting: Student in an Organized Health Care Education/Training Program

## 2022-08-14 ENCOUNTER — Encounter: Payer: Self-pay | Admitting: Student in an Organized Health Care Education/Training Program

## 2022-08-14 VITALS — BP 115/73 | HR 86 | Temp 98.2°F | Resp 16 | Ht 70.0 in | Wt 163.6 lb

## 2022-08-14 DIAGNOSIS — C760 Malignant neoplasm of head, face and neck: Secondary | ICD-10-CM | POA: Insufficient documentation

## 2022-08-14 NOTE — Progress Notes (Signed)
[facility]       Patient Name: Dillon Wood   Referring Provider: Corlis Leak Ind*    Primary Dx:   1. Squamous cell carcinoma of head and neck           ONCOLOGY CARE TEAM  Mohs Surgery: Ivette Loyal  Dermatology: Ray Church, MD   Derm Onc:  Exie Parody, MD  Radiation Oncology:  Kimberlee Nearing, MD ([facility])      HISTORY OF PRESENT ILLNESS  Dillon Wood is 89 y.o. 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     [place] he was seen by his geriatrics team 2/24, at which time the patient's family member noted worsening memory loss over the preceding months, with episodes of agitation. Family expressed concern that this cancer could have spread to his brain. 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 He was previously lined up to pursue endarterectomy in 05/2021 but family changed their minds at the last minute.    Subjective:  Presents today for follow up. Accompanied by his wife today. Very pleasant and jovial on our interaction.    Saw Dr. LRonnie Derbyon 1/29, at which time LN02 x20 applied across his scalp to multiple suspicious lesions. Instructed to start topical 5-FU after the areas had healed, but the techs at his ALF applied it immediately, which caused a great deal of burning and discomfort for him, thus he discontinued it after a few days.    Otherwise feeling well, no major changes in health. No palpable lymphadenopathy, night sweats, weight loss, fatigue, headache, visual  changes, dizziness, balance issues, cough, SOB, abd pain or other symptoms at this time. Mobility limited by ulcers on bilateral feet.       Oncology History   Squamous cell carcinoma of head and neck   08/23/2020 Initial Diagnosis    Squamous cell carcinoma of the scalp     09/14/2020 Surgery    [place]'s [place]  Size: 2.5cm  Invasion: 5.534m through subQ fat  PNI: + (maximum caliber 0.0947m    BWH T2b     10/05/2020 - 11/01/2020 Radiation    50 Gy in 20 fractions [place]   09/20/2021 Cancer Staged    Staging form: Cutaneous Carcinoma of the Head and Neck, AJCC 8th Edition  - Clinical stage from 09/20/2021: Stage III (cT3, cN0, cM0) - Signed by Al-Colonel BaldD on 09/20/2021           Allergies   Allergen Reactions    Penicillins      Disorientation     Current Outpatient Medications   Medication Sig Dispense Refill    apixaban (ELIQUIS) 5 MG Take 1 tablet (5 mg) by mouth every 12 (twelve) hours (Patient not taking: Reported on 08/14/2022)      aspirin EC 81 MG EC tablet Take 1 tablet (81 mg total) by mouth daily (Patient not taking: Reported on 08/14/2022) 30 tablet 1  atorvastatin (LIPITOR) 40 MG tablet Take 1 tablet (40 mg total) by mouth daily (Patient not taking: Reported on 08/14/2022) 90 tablet 3    fluorouracil (Efudex) 5 % cream Apply BID to scalp, forehead, sides of cheeks, ears x 2-4 weeks, expect irritation (Patient not taking: Reported on 08/14/2022) 40 g 4    niacinamide 500 MG tablet Take 1 tablet (500 mg) by mouth 2 (two) times daily with meals (Patient not taking: Reported on 08/14/2022)      tamsulosin (FLOMAX) 0.4 MG Cap Take 1 capsule (0.4 mg) by mouth Daily after dinner (Patient not taking: Reported on 08/14/2022)      triamcinolone (KENALOG) 0.1 % ointment Apply bid to eczema on lower body until clear, then use PRN (Patient not taking: Reported on 08/14/2022) 80 g 4     No current facility-administered medications for  this visit.     Past Medical History:   Diagnosis Date    Closed fracture of left side of maxilla 04/03/2017    DVT (deep venous thrombosis)     Facial contusion, initial encounter 04/03/2017    Gastroesophageal reflux disease     Hyperlipemia     Left orbit fracture, closed, initial encounter 04/03/2017    PVD (peripheral vascular disease)      Past Surgical History:   Procedure Laterality Date    ABDOMINAL SURGERY      Hernia repair     Family History   Problem Relation Age of Onset    Heart disease Father     Stroke Father     Diabetes Maternal Aunt      Social History     Socioeconomic History    Marital status: Married    Number of children: 4   Occupational History    Occupation: Associate Professor of schools   Tobacco Use    Smoking status: Never     Passive exposure: Never    Smokeless tobacco: Never   Vaping Use    Vaping Use: Never used   Substance and Sexual Activity    Alcohol use: Yes     Comment: 3 per week    Drug use: No     Review of Systems:  Negative except as noted above    Objective:     Vitals:    08/14/22 1312   BP: 115/73   Pulse: 86   Resp: 16   Temp: 98.2 F (36.8 C)   SpO2: 100%     ECOG PS: 1-2  General: NAD  Eyes: Anicteric, normal conjunctiva  ENT: Poor hearing  CV: RRR  RESP: CTA B  GI: NT/ND, no HSM  Neuro: AAO x 3  MS: no swelling in arms or legs  SKIN:   - left posterior vertex scalp 6 cm well healed scar   - right vertex is curvilinear scar 6 cm left temple graft site.   - extensive actinic damage and keratosis on scalp forehead ears face and arms, with underlying erythema  - L temple resection scar w/skin graft  LYMPH: no cervical suplarclavicular axillary lymphadenopathy    08/14/22                LAB DATA:   Lab Results   Component Value Date    WBC 7.67 04/02/2017    HGB 12.8 (L) 04/02/2017    HCT 37.0 (L) 04/02/2017    MCV 90.9 04/02/2017    PLT 136 (L) 04/02/2017     Lab Results   Component Value Date  CREAT 1.2 04/02/2017    BUN 14 04/02/2017    NA 140 04/02/2017    K 3.5  04/02/2017    CL 108 04/02/2017    CO2 23 04/02/2017     No results found for: "ALT", "AST", "GGT", "ALKPHOS", "BILITOTAL"    RADIOLOGY DATA:   PET/CT 08/14/22  FINDINGS:   Mediastinum: SUV max 3.0  Liver: SUV max 3.6     HEAD AND NECK: Normal FDG uptake. There is no FDG avid lymphadenopathy.  There is nasal septal deviation to the right.     THORAX: There are multiple FDG avid nodular opacities within the right  upper lobe and bilateral lower lobes. The most FDG avid (SUV max 15.5)  nodular opacity is located within the right upper lobe (image 127)  measuring 1.3 cm. Additional FDG avid opacities are located within the  right upper lobe (SUV max 4.8) (image 132) measuring 1.1 x 0.9 cm, right  upper lobe (SUV max 4.5 laterally) (image 122) measuring 1.0 x 0.8 cm and  (SUV max 5.0 medially) (image 122) measuring 0.5 cm and left lower lobe  (SUV max 6.7) (image 150) measuring 1.6 x 1.2 cm. There are scattered  increased interstitial lung markings compatible with chronic  change/fibrosis.     ABDOMEN AND PELVIS: Normal FDG uptake. There are bilateral renal cysts.  There are colonic diverticula without evidence of diverticulitis. The  prostate is enlarged. There is herniation of fat into the left inguinal  canal. There is a urinary bladder diverticulum located anterosuperiorly on  the left.     MUSCULOSKELETAL/EXTREMITIES: Normal FDG uptake. Multilevel degenerative  changes are noted within the spine.     IMPRESSION:   Multiple FDG avid nodular opacities within the lungs as  described compatible with inflammation/infection and/or malignancy. No  evidence of FDG avid lymphadenopathy.      MRI Brain w/ and w/o 10/12/21  FINDINGS:   There are no abnormal fluid collections. There are no masses. There is  no mass effect or midline shift. Ventricles and sulci are  age-appropriate. Chronic ischemic changes are present.  Diffusion-weighted imaging is unremarkable. There is no evidence of  acute infarction. Scattered foci of  hemosiderin deposition are present  secondary to chronic microhemorrhage. The gray-white matter junctions  are unremarkable. Clival and calvarial marrow signal is normal. Upon the  administration of intravenous contrast, there is no abnormal  enhancement.     There appears to be a 1.1 cm lesion in the right parotid gland deep  lobe.    IMPRESSION:   1. No acute intracranial process.  2. No mass, hydrocephalus, or pathologic fluid collection.  3. No acute infarct.  4. Chronic ischemic changes and age-appropriate volume are present.   5. There is a 1.1 cm lesion in the right parotid gland deep lobe. Its  imaging characteristics suggest a benign cyst however the lesion is  nonspecific      MRA Neck 10/12/21  IMPRESSION:   1.Stable MRA neck.  2.Again present is a greater than 90% stenosis of the right carotid bulb  by NASCET criteria. Extensive atherosclerotic disease throughout the  remainder of the cervical right ICA is noted with multifocal moderate  grade stenoses.  3.There is 62% stenosis of the proximal left internal carotid artery  based on NASCET criteria.  4.The vertebral arteries remain patent      PATHOLOGY DATA:  Final Diagnosis      A. Skin, biopsy, left vertex scalp:  -  SQUAMOUS CELL CARCINOMA,  WELL DIFFERENTIATED      B. Skin, biopsy, left forearm:  -  SQUAMOUS CELL CARCINOMA, WELL DIFFERENTIATED (ACANTHOLYTIC)      Electronically signed by Dorthula Rue, MD on 08/16/2020 at 12:57 PM         Final Diagnosis      A. Skin, Mohs debulk, left vertex scalp:  -  SQUAMOUS CELL CARCINOMA, WELL DIFFERENTIATED     NOTE: The tumor invades to a depth of at least 5.5 mm and is present at the base of the specimen. There is perineural invasion, with maximum nerve caliber 0.09 mm. No lymphovascular invasion is seen.      B. Skin, Mohs stage 1, left vertex scalp:  -  SQUAMOUS CELL CARCINOMA, WELL DIFFERENTIATED     NOTE: No perineural or lymphovascular invasion is seen.          Assessment & Plan:     1. 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 s/p Mohs resection (09/14/20) with small caliber PNI noted on debulk (0.48m), and now s/p adjuvant RT (EOT 11/01/20)  - No evidence of local or regional lymph node recurrence seen on exam today, however PET/CT showing concerning lung lesions c/w metastatic cSCC vs primary NSCLC vs inflammatory/infectious etiology (possible aspiration in the s/o dementia)   - Will arrange for CT-guided biopsy to confirm etiology; currently discussing with IR whether study can be done w/o general anesthesia, as this would not be favorable in a 89yowith advanced dementia  - Briefly discussed that single agent PD1-inhibitors could be considered for treatment of metastatic disease, with due caution given advanced age, comorbidities, and poor tolerance to any irAE    2. 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 LRonnie Derby-- message sent for her to f/u with family for recommendations in the setting of poor tolerance to Effudex currently  - continue dermatologic surveillance with [facility] [id number] Dr. AbSamuel Germanynd Dr. LuRonnie Derbycan alternate between them).    3. LE DVT  - developed ~Jan 2022 i/s/o LE vascular instrumentation, though hx unclear  - currently taking apixaban 32m59mO BID, explained that this can be safely suspended in the perioperative period for bx  - will obtain PT/INR/aPTT closer to time of procedure once meds are held  - will consider risks/benefits of continuing as we move forward, better to keep on for now given prior DVT and possible active metastatic malignancy    RTC in 4wks        Sylus Stgermain Al-Mondhiry, MD, MA  Medical Oncologist, Cutaneous Malignancies Program  [id number], VirHomer Cityhone: [phone]: [phone]

## 2022-08-15 ENCOUNTER — Telehealth (INDEPENDENT_AMBULATORY_CARE_PROVIDER_SITE_OTHER): Payer: Self-pay

## 2022-08-15 DIAGNOSIS — G629 Polyneuropathy, unspecified: Secondary | ICD-10-CM

## 2022-08-15 NOTE — Addendum Note (Signed)
Addended by: Delcie Roch FE I. on: 08/15/2022 05:57 PM     Modules accepted: Orders

## 2022-08-15 NOTE — Telephone Encounter (Signed)
Medical House Calls     SW called the patient's daughter, Nira Conn, in response to an appointment request that was received by the office via My Chart. The family will be in town on 3/1 and had requested that the patient and his spouse be evaluated by one of [facility]'s doctors. This worker called Nira Conn to get more information on what the family was hoping to achieve during the meeting. It appears that they would like a med rec done to assess the appropriateness of the patient's current medications and discuss the current health status of both patients.     As this is not a social work request, Risk manager communicated with the provider and she will discuss this request with the doctor.     This worker called Nira Conn to let her know that the request will be passed on to the doctor, but that she is not available on 3/1. The office will contact the family with the doctor's availability.    Nira Conn also asked that the patient's PT be discontinued for now as he is in too much pain to participate.     Gloriann Loan, MSW  Social Work Tourist information centre manager  [employer]   [phone]

## 2022-08-16 ENCOUNTER — Other Ambulatory Visit: Payer: Self-pay

## 2022-08-16 ENCOUNTER — Telehealth: Payer: Self-pay | Admitting: Dermatology

## 2022-08-16 ENCOUNTER — Telehealth: Payer: Self-pay

## 2022-08-16 DIAGNOSIS — Z86718 Personal history of other venous thrombosis and embolism: Secondary | ICD-10-CM

## 2022-08-16 DIAGNOSIS — C760 Malignant neoplasm of head, face and neck: Secondary | ICD-10-CM

## 2022-08-16 DIAGNOSIS — I82409 Acute embolism and thrombosis of unspecified deep veins of unspecified lower extremity: Secondary | ICD-10-CM

## 2022-08-16 DIAGNOSIS — Z9229 Personal history of other drug therapy: Secondary | ICD-10-CM

## 2022-08-16 MED ORDER — DESONIDE 0.05 % EX OINT
TOPICAL_OINTMENT | Freq: Two times a day (BID) | CUTANEOUS | 5 refills | Status: AC
Start: 2022-08-16 — End: ?

## 2022-08-16 MED ORDER — MUPIROCIN 2 % EX OINT
TOPICAL_OINTMENT | CUTANEOUS | 3 refills | Status: AC
Start: 2022-08-16 — End: ?

## 2022-08-16 NOTE — Telephone Encounter (Signed)
[facility] placed fax to [facility], with order for d/c PT, per dtr request, confirmed receipt with right fax.

## 2022-08-16 NOTE — Telephone Encounter (Signed)
Spoke to daughter Nira Conn, [place] [id number]    She said the [facility], has been applying efudex to all face/scalp immediately after he had extensive cryotherapy done to face/scalp. They did not wait 2 weeks as instructed to let cryotherapy sites heal. He has extensive crusting and scabbing and pain.   (Pics in chart under oncology note)    Spoke to daughter  Plan to stop efudex asap  Cool compresses  Rx sent for mupirocin and desonide ointment to use both BID to face/scalp until healed.   Will also call [facility], [phone], and will try to speak to the director of nursing, to let them know of these instructions.     Daughter appreciated the phone call         Exie Parody, MD, FAAD  Melanoma, Pigmented Lesion and Skin Cancer Specialist  [facility]   [address]  [place], [place] [id number]  [email or web address]  T [phone]  F [phone]

## 2022-08-16 NOTE — Telephone Encounter (Signed)
PET CT 2.20.24 with concerning lung lesion. Dr. Aileen Fass recommended CT guided biopsy for further evaluation/to confirm etiology.     CT guided biopsy without anesthesia scheduled 08/28/22 at 10 AM with IR [facility].     Prep:  Blood work (CBC/CMP/PT/INR/aPTT) prior, on 08/27/22 scheduled at 1045 AM at [facility].   Hold aspirin, aleve, NSAIDs, motrin, etc. 5 days prior, starting 08/23/22.   Hold apixiban 3 days prior, starting 08/26/22.   Nothing to eat or drink after midnight the night before your procedure this includes water, ice chips, mints, candy, and gum. Ok to take any medication (other than blood thinners) with enough water to successfully swallow the pills.  Do not smoke 24 hours prior to procedure.  Bring only insurance card and your photo ID. Leave all valuables at home.   A driver will be needed for [place] to and from his procedure.     CT guided appointment details and patient prep discussed with patients daughter, Nira Conn. Nira Conn will be in town for procedure and labs, she will be transporting patient. [name] had no further questions surrounding procedure/time/date/location/prep. Will send [facility] for reference.     Nira Conn explains that the patient is still experiencing pain from efudex reaction to facial/scalp lesions. See 08/14/22 note and photos. Nira Conn also states the patient is not using any topicals to the reaction areas and is using tylenol for pain relief. Will discuss further with Dr. Al-Mondhiry/Dr. Ronnie Derby. Once further discussed will plan to call ALF [facility] for recommendation confirmation.

## 2022-08-21 NOTE — Telephone Encounter (Addendum)
Called ALF, [facility] to follow up and to confirm fax number. Spoke with the Nursing Team at [facility], [place]: [phone].     Efaxed Dr. Ruel Favors 08/16/22 telephone note to [facility] Nursing Team.   Also placed nursing communication order stating the following, "Treatment plan for crusting, scabbing, painful face and scalp lesions post cryotherapy and efudex application. Dr. Exie Parody recommends stopping efudex application ASAP. Apply cool compress as needed. Apply prescription topicals, mupirocin and desonide, twice daily to face/scalp until healed. "  Nursing communication order efaxed to [facility] Nursing Team too for reference. Efax's sent successfully. The Nursing Team at [facility] confirmed receipt.     Spoke with [facility] Nurse Jaqueline who states they have stopped efudex application and the patients facial and scalp lesions look so much better, still with lingering redness but crusting resolved. Jaqueline/Nursing Team will call back with questions/concerns.

## 2022-08-21 NOTE — Telephone Encounter (Signed)
Pt's daughter called in to f/u on the visit from a MD request for this patient, states would like an MD to do a complete examination.     [facility] # [phone].

## 2022-08-21 NOTE — Addendum Note (Signed)
Addended by: Kathrine Cords on: 08/21/2022 12:23 PM     Modules accepted: Orders

## 2022-08-22 ENCOUNTER — Telehealth (INDEPENDENT_AMBULATORY_CARE_PROVIDER_SITE_OTHER): Payer: Self-pay | Admitting: Nurse Practitioner

## 2022-08-22 MED ORDER — LORAZEPAM 0.5 MG PO TABS
0.5000 mg | ORAL_TABLET | Freq: Two times a day (BID) | ORAL | 0 refills | Status: DC | PRN
Start: 2022-08-22 — End: 2022-09-13

## 2022-08-22 NOTE — Telephone Encounter (Signed)
Lorazepam 0.5 mg q 12 hrs prn for anxiety, sent to pharm, requested by Staff at [facility].

## 2022-08-22 NOTE — Telephone Encounter (Addendum)
Nurse at ALF called for a refill.    Lorazepam 0.5 mg take one tab by mouth every 6 hours as needed for anxiety    RX was written 08/19/2021   30 pills were ordered - he never asked for them  - it is on his PRN orders on MAR at ALF, so they need a new supply.    The order was written Geri Seminole, PA.    ALF nurse [facility] LPN [phone]    Fax # [phone]    Please send to [place]

## 2022-08-23 ENCOUNTER — Other Ambulatory Visit: Payer: Self-pay

## 2022-08-23 ENCOUNTER — Telehealth: Payer: Self-pay

## 2022-08-23 DIAGNOSIS — C760 Malignant neoplasm of head, face and neck: Secondary | ICD-10-CM

## 2022-08-23 NOTE — Telephone Encounter (Addendum)
Patients daughter, Nira Conn, called. [name] explains [facility] needs CT guided biopsy prep instructions to follow.     Nursing communication order completed containing prep instructions that were provided to [facility] on 08/16/22. Efaxed nursing communication order to [facility] nursing team at Fax: [phone]. Marked as STAT/URGENT review.     Addendum:   Efax sent successfully

## 2022-08-27 ENCOUNTER — Other Ambulatory Visit: Payer: Medicare Other

## 2022-08-27 ENCOUNTER — Other Ambulatory Visit (FREE_STANDING_LABORATORY_FACILITY): Payer: Medicare Other

## 2022-08-27 DIAGNOSIS — Z9229 Personal history of other drug therapy: Secondary | ICD-10-CM

## 2022-08-27 DIAGNOSIS — I82409 Acute embolism and thrombosis of unspecified deep veins of unspecified lower extremity: Secondary | ICD-10-CM

## 2022-08-27 DIAGNOSIS — C760 Malignant neoplasm of head, face and neck: Secondary | ICD-10-CM

## 2022-08-27 DIAGNOSIS — Z86718 Personal history of other venous thrombosis and embolism: Secondary | ICD-10-CM

## 2022-08-27 LAB — CBC AND DIFFERENTIAL
Absolute NRBC: 0 10*3/uL (ref 0.00–0.00)
Basophils Absolute Automated: 0.03 10*3/uL (ref 0.00–0.08)
Basophils Automated: 0.4 %
Eosinophils Absolute Automated: 0.14 10*3/uL (ref 0.00–0.44)
Eosinophils Automated: 1.8 %
Hematocrit: 37.2 % — ABNORMAL LOW (ref 37.6–49.6)
Hgb: 12.4 g/dL — ABNORMAL LOW (ref 12.5–17.1)
Immature Granulocytes Absolute: 0.06 10*3/uL (ref 0.00–0.07)
Immature Granulocytes: 0.8 %
Instrument Absolute Neutrophil Count: 5.99 10*3/uL (ref 1.10–6.33)
Lymphocytes Absolute Automated: 1.07 10*3/uL (ref 0.42–3.22)
Lymphocytes Automated: 13.8 %
MCH: 31.1 pg (ref 25.1–33.5)
MCHC: 33.3 g/dL (ref 31.5–35.8)
MCV: 93.2 fL (ref 78.0–96.0)
MPV: 8.5 fL — ABNORMAL LOW (ref 8.9–12.5)
Monocytes Absolute Automated: 0.47 10*3/uL (ref 0.21–0.85)
Monocytes: 6.1 %
Neutrophils Absolute: 5.99 10*3/uL (ref 1.10–6.33)
Neutrophils: 77.1 %
Nucleated RBC: 0 /100 WBC (ref 0.0–0.0)
Platelets: 117 10*3/uL — ABNORMAL LOW (ref 142–346)
RBC: 3.99 10*6/uL — ABNORMAL LOW (ref 4.20–5.90)
RDW: 15 % (ref 11–15)
WBC: 7.76 10*3/uL (ref 3.10–9.50)

## 2022-08-27 LAB — COMPREHENSIVE METABOLIC PANEL
ALT: 15 U/L (ref 0–55)
AST (SGOT): 15 U/L (ref 5–41)
Albumin/Globulin Ratio: 1.1 (ref 0.9–2.2)
Albumin: 3.5 g/dL (ref 3.5–5.0)
Alkaline Phosphatase: 86 U/L (ref 37–117)
Anion Gap: 7 (ref 5.0–15.0)
BUN: 26 mg/dL (ref 9.0–28.0)
Bilirubin, Total: 0.6 mg/dL (ref 0.2–1.2)
CO2: 23 mEq/L (ref 17–29)
Calcium: 9 mg/dL (ref 7.9–10.2)
Chloride: 110 mEq/L (ref 99–111)
Creatinine: 1.5 mg/dL (ref 0.5–1.5)
Globulin: 3.2 g/dL (ref 2.0–3.6)
Glucose: 104 mg/dL — ABNORMAL HIGH (ref 70–100)
Potassium: 4.3 mEq/L (ref 3.5–5.3)
Protein, Total: 6.7 g/dL (ref 6.0–8.3)
Sodium: 140 mEq/L (ref 135–145)
eGFR: 43.6 mL/min/{1.73_m2} — AB (ref >=60)

## 2022-08-27 LAB — PT/INR
PT INR: 1.4 — ABNORMAL HIGH (ref 0.9–1.1)
PT: 16.5 s — ABNORMAL HIGH (ref 10.1–12.9)

## 2022-08-27 LAB — APTT: PTT: 32 s (ref 27–39)

## 2022-08-27 LAB — HEMOLYSIS INDEX: Hemolysis Index: 8 Index (ref 0–24)

## 2022-08-28 ENCOUNTER — Ambulatory Visit: Payer: Self-pay

## 2022-08-28 ENCOUNTER — Ambulatory Visit
Admission: RE | Admit: 2022-08-28 | Discharge: 2022-08-28 | Disposition: A | Discharge status: Home / Self Care | Payer: Medicare Other | Source: Ambulatory Visit | Attending: Body Imaging | Admitting: Body Imaging

## 2022-08-28 ENCOUNTER — Ambulatory Visit
Admission: RE | Admit: 2022-08-28 | Discharge: 2022-08-28 | Disposition: A | Discharge status: Home / Self Care | Payer: Medicare Other | Source: Ambulatory Visit | Attending: Student in an Organized Health Care Education/Training Program | Admitting: Student in an Organized Health Care Education/Training Program

## 2022-08-28 DIAGNOSIS — J189 Pneumonia, unspecified organism: Secondary | ICD-10-CM

## 2022-08-28 DIAGNOSIS — R918 Other nonspecific abnormal finding of lung field: Secondary | ICD-10-CM | POA: Insufficient documentation

## 2022-08-28 DIAGNOSIS — C760 Malignant neoplasm of head, face and neck: Secondary | ICD-10-CM

## 2022-08-28 MED ORDER — PROMETHAZINE HCL 25 MG/ML IJ SOLN
INTRAMUSCULAR | Status: AC | PRN
Start: 2022-08-28 — End: 2022-08-28
  Administered 2022-08-28: 6.25 mg via INTRAVENOUS

## 2022-08-28 MED ORDER — FENTANYL CITRATE (PF) 50 MCG/ML IJ SOLN (WRAP)
INTRAMUSCULAR | Status: AC | PRN
Start: 2022-08-28 — End: 2022-08-28
  Administered 2022-08-28: 25 ug via INTRAVENOUS

## 2022-08-28 MED ORDER — MIDAZOLAM HCL 1 MG/ML IJ SOLN (WRAP)
INTRAMUSCULAR | Status: AC | PRN
Start: 2022-08-28 — End: 2022-08-28
  Administered 2022-08-28: .5 mg via INTRAVENOUS

## 2022-08-28 MED ORDER — LIDOCAINE HCL 1 % IJ SOLN
INTRAMUSCULAR | Status: AC | PRN
Start: 2022-08-28 — End: 2022-08-28
  Administered 2022-08-28: 3 mL

## 2022-08-28 MED ORDER — LIDOCAINE HCL 1 % IJ SOLN
INTRAMUSCULAR | Status: AC | PRN
Start: 2022-08-28 — End: 2022-08-28
  Administered 2022-08-28: 2 mL

## 2022-08-28 NOTE — Sedation Documentation (Signed)
Patient arrived to CT suite for R lung biopsy. Patient AAOx4. Threshold pause completed.   Patient transferred independently to CT table to prone position.   Monitors attached. Nasal canula applied. Patient draped and prepped in sterile fashion.   Site prepped by Dr..per SOP.

## 2022-08-28 NOTE — Sedation Documentation (Signed)
CT guided lung biopsy complete.     Medications given:  12.'5mg'$  IV phenergan  0.5 mg IV versed  153mg IV fentanyl given for procedure.     Patient tolerated procedure well.     Puncture site dressed with gauze and tegaderm     Patient to return to recovery post procedure.

## 2022-08-28 NOTE — Discharge Instr - AVS First Page (Signed)
Interventional Radiology  Discharge Instructions following Biopsy    You have had a Lung biopsy performed by Dr. Cameron Proud on 08/28/22.    A biopsy is a small sample of tissue or fluid taken from the body.   Image-guided biopsy allows an Interventional Radiologist take a sample from an organ or mass without surgery.  This sample can then be studied in a laboratory.     General Instructions:  Rest today post procedure.  No heavy lifting (greater than 5 pounds) or straining for the next 72 hours (3 days).  No driving for 24 hours post procedure due to medication/sedation you may have received during your procedure.  Avoid alcohol for the next 24 hours after the procedure if you received sedation.    Site Care:   You may shower and remove the dressing tomorrow.    You may leave the site uncovered or replace with a new dry dressing or Band-Aid each day until healed.  Do NOT use creams, powders, lotions, or ointments at the site.   Bruising sometimes occurs at the site where the needle was inserted.    Call  the Interventional Radiologist if you observe:  Prolonged oozing of blood from the biopsy site  Pain at the site for more than 3 days    Increased pain or unrelieved pain  Dizziness or lightheadedness  Difficulty breathing or shortness of breath  Redness, warmth to touch, or discharge from biopsy site  Coughing up blood (more than 3 teaspoons of blood is significant)  Chest pain      If you have questions or concerns, please call an Interventional Radiologist:    Contact Numbers:    Regular business hours (8A-5P M-F):  [facility]:  [phone] option 3    After hours:  Answering service:  [phone]

## 2022-08-28 NOTE — H&P (Signed)
BRIEF PRE PROCEDURE H&P      PROCEDURALIST COMMENTS BELOW:     CT guided lung nodule biopsy side tbd    INDICATIONS:   Lung nodules      REVIEW OF SYSTEMS:   YES  ( x )         ALLERGIES:     NO  (  )   YES  ( x )          PHYSICAL EXAM     AIRWAY CLASSIFICATION:    CLASS I   (  )     CLASS II  (x  )    CLASS III  (  )     CLASS IV  (  )    CARDIAC :   ( x )  RRR  (  )  IRREG  (  )  MURMUR      LUNGS:   ( x )  CLEAR  (  )  DIMINISHED    (  ) LEFT   (  )  RIGHT  (  )  ABSENT          (  ) LEFT   (  )  RIGHT  (  )  TUBES            (  ) LEFT   (  )  RIGHT          ABDOMEN:   neg      NEURO:   neg      OTHER:   neg    LABS:     Lab Results   Component Value Date/Time    WBC 7.76 08/27/2022 10:40 AM    HCT 37.2 (L) 08/27/2022 10:40 AM    PLT 117 (L) 08/27/2022 10:40 AM    INR 1.4 (H) 08/27/2022 10:40 AM    PT 16.5 (H) 08/27/2022 10:40 AM    PTT 32 08/27/2022 10:40 AM    BUN 26.0 08/27/2022 10:40 AM    GLU 104 (H) 08/27/2022 10:40 AM    K 4.3 08/27/2022 10:40 AM           ASA PHYSICAL STATUS   (  )  ASA 1   HEALTHY PATIENT  (  )  ASA 2   MILD SYSTEMIC ILLNESS  ( x )  ASA 3   SYSTEMIC DISEASE, NOT INCAPACITATING  (  )  ASA 4   SEVERE SYSTEMIC DISEASE, DISEASE IS CONSTANT THREAT TO                         LIFE  (  )  ASA 5   MORIBUND CONDITION, NOT EXPECTED TO LIVE >24 HOURS            IRRESPECTIVE OF PROCEDURE  (  )  E           EMERGENCY PROCEDURE       PLANNED SEDATION:   (  ) NO SEDATION  ( x ) MODERATE SEDATION  (  ) DEEP SEDATION WITH ANESTHESIA      CONCLUSION:   PATIENT HAS BEEN REASSESSED IMMEDIATELY PRIOR TO THE PROCEDURE   AND IS AN APPROPRIATE CANDIDATE FOR THE PLANNED SEDATION AND   PROCEDURE.  RISKS, BENEFITS AND ALTERNATIVES TO THE PLANNED   PROCEDURE AND SEDATION HAVE BEEN EXPLAINED TO THE PATIENT   OR GUARDIAN.    ( x )  YES  (  )  EMERGENCY  CONSENT         Signed by Maryclare Labrador, MD.

## 2022-08-28 NOTE — Procedures (Signed)
HISTORY: History of squamous cell carcinoma of the head and neck. PET positive lung nodules    TECHNIQUE:    The following dose reduction techniques were utilized: automated exposure control and/or adjustment of the mA and/or KV according to patient size, and the use of an iterative reconstruction technique.    PROCEDURE:    The nature of the procedure including risks, alternatives, and indications were discussed. All questions were answered. Consent was obtained.    Patient identification was verified. Procedure site was documented and verified. A procedure time out immediately prior to beginning the procedure to verify correct patient, correct procedure and correct site with all members of the procedure team was performed and documented.    Moderate sedation was provided by the Interventional Radiology Nurse under my direct supervision for 45 minutes. Limited CT imaging performed to localize one of the patient's right lower lobe nodules. Skin was prepped and draped in the usual sterile fashion. 1% lidocaine administered as a local anesthetic. Under CT guidance, a 17 gauge coaxial needle was placed. 2 passes were made with an 18 gauge biopsy needle. Patient experienced some hemoptysis after the second pass and the procedure was therefore terminated. Microbiology analysis was sent as well as histology.    Complications: None. Estimated blood loss 20 cc    IMPRESSION:   Technically successful CT guided right lower lobe lung nodule biopsy.

## 2022-08-28 NOTE — Progress Notes (Signed)
Post procedure cxr clear per Dr Cameron Proud. Patient ok to discharge.

## 2022-08-29 ENCOUNTER — Encounter (INDEPENDENT_AMBULATORY_CARE_PROVIDER_SITE_OTHER): Payer: Self-pay | Admitting: Nurse Practitioner

## 2022-08-31 LAB — LAB USE ONLY - HISTORICAL SURGICAL PATHOLOGY

## 2022-09-03 ENCOUNTER — Telehealth: Payer: Self-pay | Admitting: Student in an Organized Health Care Education/Training Program

## 2022-09-03 DIAGNOSIS — C760 Malignant neoplasm of head, face and neck: Secondary | ICD-10-CM

## 2022-09-03 DIAGNOSIS — J15211 Pneumonia due to Methicillin susceptible Staphylococcus aureus: Secondary | ICD-10-CM

## 2022-09-03 MED ORDER — QUETIAPINE FUMARATE 25 MG PO TABS
25.0000 mg | ORAL_TABLET | Freq: Every evening | ORAL | 0 refills | Status: DC
Start: 2022-09-03 — End: 2022-09-03

## 2022-09-03 MED ORDER — DOXYCYCLINE HYCLATE 100 MG PO CAPS
100.0000 mg | ORAL_CAPSULE | Freq: Two times a day (BID) | ORAL | 0 refills | Status: AC
Start: 2022-09-03 — End: 2022-09-13

## 2022-09-03 MED ORDER — DOXYCYCLINE HYCLATE 100 MG PO CAPS
100.0000 mg | ORAL_CAPSULE | Freq: Two times a day (BID) | ORAL | 0 refills | Status: DC
Start: 2022-09-03 — End: 2022-09-03

## 2022-09-03 MED ORDER — QUETIAPINE FUMARATE 25 MG PO TABS
25.0000 mg | ORAL_TABLET | Freq: Every evening | ORAL | 0 refills | Status: DC | PRN
Start: 2022-09-03 — End: 2022-09-13

## 2022-09-03 NOTE — Telephone Encounter (Signed)
Called patient's daughter Dillon Wood to discuss recent RUL lung nodule biopsy, which did NOT show any metastatic SCC. Bacterial culture grew out Staph warneri, a common skin and oral flora bacteria, which may have been aspirated (particularly with his advancing dementia) causing a pneumonia picture on CT scans.     I cautioned her that this species may in fact just be a contaminant captured in the culturing process, and given the highly worrisome picture for cancer seen by PET/CT with a fairly small nodule that was targeted for biopsy (though also the hottest by FDG-avidity), this may be a false-negative result. Thus, we discussed repeating PET/CT imaging in 18motime to look for improvements after a trial of oral antibiotic treatment. Given his penicillin allergy documented and his advanced age, and based on sensitivities documented in the culture, we chose doxycycline '200mg'$  PO BID x10d. Instructions around taking with a full glass of water and staying upright after intake for 370m, as well as risk of photosensitivity, explained by phone and included on the prescription label.    [name] re-iterated past concerns about her father's worsening dementia, with episodes of agitation or aggression. He is seen by a visiting geriatrics NP that evaluates [place] at home, however no medications provided for this purpose beyond ativan 0.'5mg'$  PO PRN, which is not ideal for older adults. Rx for Seroquel '25mg'$  PO QHS PRN for agitation provided. This can be trialed and potentially up-titrated to maximum efficacy (up to '75mg'$  PO BID), monitoring for over-sedation.    [name] expressed understanding and appreciation for the call. Will f/u with Britt in 40m30moth a repeat PET/CT at that time.    Merranda Bolls Al-Mondhiry, MD, MA  Medical Oncologist, Cutaneous Malignancies Program  Assistant Professor of Medical Education, [id number][id number], [place]:  [phone]: [phone]

## 2022-09-11 ENCOUNTER — Ambulatory Visit: Payer: Medicare Other | Admitting: Student in an Organized Health Care Education/Training Program

## 2022-09-13 ENCOUNTER — Encounter (INDEPENDENT_AMBULATORY_CARE_PROVIDER_SITE_OTHER): Payer: Self-pay | Admitting: Nurse Practitioner

## 2022-09-13 ENCOUNTER — Ambulatory Visit: Payer: Medicare Other | Admitting: Nurse Practitioner

## 2022-09-13 VITALS — BP 100/58 | HR 86 | Temp 97.6°F

## 2022-09-13 DIAGNOSIS — R3912 Poor urinary stream: Secondary | ICD-10-CM

## 2022-09-13 DIAGNOSIS — N401 Enlarged prostate with lower urinary tract symptoms: Secondary | ICD-10-CM

## 2022-09-13 DIAGNOSIS — L600 Ingrowing nail: Secondary | ICD-10-CM

## 2022-09-13 DIAGNOSIS — R413 Other amnesia: Secondary | ICD-10-CM

## 2022-09-13 DIAGNOSIS — C760 Malignant neoplasm of head, face and neck: Secondary | ICD-10-CM

## 2022-09-13 DIAGNOSIS — Z0120 Encounter for dental examination and cleaning without abnormal findings: Secondary | ICD-10-CM

## 2022-09-13 DIAGNOSIS — R32 Unspecified urinary incontinence: Secondary | ICD-10-CM

## 2022-09-13 DIAGNOSIS — I82512 Chronic embolism and thrombosis of left femoral vein: Secondary | ICD-10-CM

## 2022-09-13 DIAGNOSIS — F03B18 Unspecified dementia, moderate, with other behavioral disturbance: Secondary | ICD-10-CM

## 2022-09-13 MED ORDER — QUETIAPINE FUMARATE 25 MG PO TABS
25.0000 mg | ORAL_TABLET | Freq: Every evening | ORAL | 1 refills | Status: AC
Start: 2022-09-13 — End: 2023-03-12

## 2022-09-13 NOTE — Addendum Note (Signed)
Addended by: Delcie Roch FE I. on: 09/13/2022 05:04 PM     Modules accepted: Orders

## 2022-09-13 NOTE — Procedures (Addendum)
Date: 09/13/2022    Patient Name: Dillon Wood, Dillon Wood    Patient was seen in an assisted living facility (POS 13) in lieu of an office visit for the following reason:   Requires use of an assistive device in order to ambulate due to poor balance, weakness.        Code Status: FULL    HPI:   Dillon Wood is a 89 y.o. year old were are here to address chronic problems.    Seen at their home, alert and responsive. Calm and cooperative. Forgetful. Unable to recall current month/year.    He denies pain.     He has skin melanoma. On 07/23/22- Seen by Dr. Ronnie Derby, [place] and skin CA specialist at [facility]. On 08/14/22- Seen by Colonel Bald, MD, Medical Oncologist, Cutaneous Malignancies Program. [place]. On 09/03/22, Dr. Truitt Merle, spoke with pt's daughter about the result of his lung biopsy (RUL nodule biopsy, did not show mets). He was placed on doxycycline 200 mg BID x 10 days due to bacterial culture,  he is tolerating. No GI symptoms so far.     He was placed on seroquel 25 mg q HS prn for agitation. Still on PRN ativan. Will stop ativan. He was calm during entire visit.    His skin lesions on his face/scalp has cleared up.     On exam, still noticed with black, stable scab to L foot, plantar,  indurated and L hallux medial area, crusty lesion. Non tender. He also have L toenail ingrown. He is followed by Dr. [name], podiatrist.    Takes flomax 0.4 mg after dinner for BPH. No retention. Followed by Dr. Vonita Moss, at [facility] urology.      Review of Systems    Past Medical History:   Diagnosis Date    Closed fracture of left side of maxilla 04/03/2017    DVT (deep venous thrombosis)     Facial contusion, initial encounter 04/03/2017    Gastroesophageal reflux disease     Hyperlipemia     Left orbit fracture, closed, initial encounter 04/03/2017    PVD (peripheral vascular disease)      Past Surgical History:   Procedure Laterality Date    ABDOMINAL SURGERY      Hernia  repair     Family History   Problem Relation Age of Onset    Heart disease Father     Stroke Father     Diabetes Maternal Aunt      Social History     Tobacco Use    Smoking status: Never     Passive exposure: Never    Smokeless tobacco: Never   Vaping Use    Vaping status: Never Used   Substance Use Topics    Alcohol use: Yes     Comment: 3 per week    Drug use: No     Allergies   Allergen Reactions    Penicillins      Disorientation       MEDICATIONS:     Current Outpatient Medications:     apixaban (ELIQUIS) 5 MG, Take 1 tablet (5 mg) by mouth every 12 (twelve) hours, Disp: , Rfl:     aspirin EC 81 MG EC tablet, Take 1 tablet (81 mg total) by mouth daily, Disp: 30 tablet, Rfl: 1    atorvastatin (LIPITOR) 40 MG tablet, Take 1 tablet (40 mg total) by mouth daily, Disp: 90 tablet, Rfl: 3    desonide (DESOWEN) 0.05 %  ointment, Apply topically 2 (two) times daily Apply bid to scalp/face until reaction improved, Disp: 60 g, Rfl: 5    doxycycline (VIBRAMYCIN) 100 MG capsule, Take 1 capsule (100 mg) by mouth 2 (two) times daily for 10 days Please take with a full glass of water, stay upright for at least 38min after taking the medication. Avoid direct sunlight exposure while taking the medication, as it can cause photo-sensitivity., Disp: 20 capsule, Rfl: 0    fluorouracil (Efudex) 5 % cream, Apply BID to scalp, forehead, sides of cheeks, ears x 2-4 weeks, expect irritation (Patient not taking: Reported on 08/14/2022), Disp: 40 g, Rfl: 4    mupirocin (BACTROBAN) 2 % ointment, Apply bid to face/scalp lesions until healed, Disp: 22 g, Rfl: 3    niacinamide 500 MG tablet, Take 1 tablet (500 mg) by mouth 2 (two) times daily with meals, Disp: , Rfl:     QUEtiapine (SEROquel) 25 MG tablet, Take 1 tablet (25 mg) by mouth nightly, Disp: 90 tablet, Rfl: 1    tamsulosin (FLOMAX) 0.4 MG Cap, Take 1 capsule (0.4 mg) by mouth Daily after dinner, Disp: , Rfl:     triamcinolone (KENALOG) 0.1 % ointment, Apply bid to eczema on lower body  until clear, then use PRN (Patient not taking: Reported on 08/14/2022), Disp: 80 g, Rfl: 4    PHYSICAL EXAM:   BP 100/58   Pulse 86   Temp 97.6 F (36.4 C)   SpO2 98%    Wt Readings from Last 1 Encounters:   08/14/22 74.2 kg (163 lb 9.6 oz)      Ht Readings from Last 1 Encounters:   08/14/22 1.778 m (5\' 10" )     Physical Exam  Constitutional:       General: He is not in acute distress.     Appearance: Normal appearance. He is not ill-appearing, toxic-appearing or diaphoretic.   HENT:      Head: Normocephalic and atraumatic.      Comments: + Hearing aids, bilat.  Eyes:      Conjunctiva/sclera: Conjunctivae normal.   Cardiovascular:      Rate and Rhythm: Normal rate.   Pulmonary:      Effort: Pulmonary effort is normal. No respiratory distress.      Breath sounds: Normal breath sounds. No wheezing, rhonchi or rales.   Abdominal:      General: Bowel sounds are normal.      Palpations: Abdomen is soft.   Musculoskeletal:      Right lower leg: No edema.      Left lower leg: No edema.      Comments: See HPI.   Skin:     Comments: See HPI.   Neurological:      General: No focal deficit present.      Mental Status: He is alert. Mental status is at baseline.       DIAGNOSTICS:     Lab Results   Component Value Date    WBC 7.76 08/27/2022    HGB 12.4 (L) 08/27/2022    HCT 37.2 (L) 08/27/2022    PLT 117 (L) 08/27/2022    ALT 15 08/27/2022    AST 15 08/27/2022    NA 140 08/27/2022    K 4.3 08/27/2022    CL 110 08/27/2022    CREAT 1.5 08/27/2022    BUN 26.0 08/27/2022    CO2 23 08/27/2022    INR 1.4 (H) 08/27/2022    GLU 104 (H) 08/27/2022  ALKPHOS 86 08/27/2022     CT Guided Biopsy Lung Pq Ndl    Result Date: 08/28/2022   Technically successful CT guided right lower lobe lung nodule biopsy. Elwanda Brooklyn, MD 08/28/2022 2:32 PM    XR Chest AP Portable    Result Date: 08/28/2022   No evidence of pneumothorax. Elwanda Brooklyn, MD 08/28/2022 12:53 PM     ASSESSMENT and PLAN:   No problem-specific Assessment & Plan notes found for  this encounter.    Medications Ordered This Encounter         Disp Refills Start End    QUEtiapine (SEROquel) 25 MG tablet 90 tablet 1 09/13/2022 03/12/2023    Take 1 tablet (25 mg) by mouth nightly - Oral          No orders of the defined types were placed in this encounter.    Careem was seen today for follow-up.    Diagnoses and all orders for this visit:    Acute deep vein thrombosis (DVT) of femoral vein of left lower extremity: On eliquis 5 mg q 12 hrs.    Eczematous/actinic skin lesions: Followed at [facility] skin CA institute, Dr. Exie Parody and Dr. Aileen Fass.    Gait instability: Will order PT/OT, per daughter's request.    Dementia/memory loss: refer to neurologist.    Agitation: schedule seroquel 25 mg Q HS. D/C ativan.    Ingrown toenail: He is followed by Dr. [name], podiatrist. Upcoming appt, 09/17/22.    BPH: on flomax 0.4 mg daily.    Addendum: I spoke with her daughter, Nira Conn. POC discussed. [name] wants a referral for Dentist.        Electronically Signed by Ahmed Prima, NP

## 2022-09-14 ENCOUNTER — Telehealth (INDEPENDENT_AMBULATORY_CARE_PROVIDER_SITE_OTHER): Payer: Self-pay | Admitting: Nurse Practitioner

## 2022-09-14 NOTE — Telephone Encounter (Unsigned)
[facility] RN received orders from PCP.    No Seroquel and Ativan prn. I stopped Ativan, scheduled Seroquel q HS. Stop effudex,

## 2022-09-14 NOTE — Telephone Encounter (Unsigned)
[facility] RN faxed individual orders to d/c efudex 5 % cream, d/c Ativan 0.5 mg tab PRN and d/c Seroquel 25 mg PRN to ALF [facility].

## 2022-09-14 NOTE — Telephone Encounter (Signed)
-----   Message from Ahmed Prima, NP sent at 09/13/2022  4:34 PM EDT -----  Refer to neurologist: [phone]

## 2022-09-14 NOTE — Telephone Encounter (Signed)
This LPN faxed new medlist to ALF, faxed demo and nuero referral to [facility] Neurology [phone] (F)

## 2022-09-14 NOTE — Telephone Encounter (Signed)
ALF med list reviewed by [facility] RN:    Tamsulosin 0.4 cap one a bedtime    Quetiapine 25 mg tab take one nightly    Atorvastatin 40 mg nightly    Eliquis 5 mg twice daily    Aspirin 81 mg daily    Niacinamide 500 mg tab one tab twice daily for supplement    Triamcinolone acetonide 0.1 % ointment apply topically to lower   body every 12 hours as needed for ezcema    Fluocinolone acetonide 0.025% ointment Apply twice daily to scalp and face    Doxycycline 100 mg cap twice daily (ending on 3/22 in ALF orders)    ALF also has orders for:    Quetiapine 25 mg tab take one nightly as needed    Lorazepam 0.5 mg tab every 12 hours

## 2022-10-25 ENCOUNTER — Telehealth (INDEPENDENT_AMBULATORY_CARE_PROVIDER_SITE_OTHER): Payer: Self-pay | Admitting: Nurse Practitioner

## 2022-10-25 NOTE — Telephone Encounter (Signed)
LVM for [name] informing of change in PCP from Fe to Dr Girmay starting 5/15.

## 2022-10-29 ENCOUNTER — Telehealth (INDEPENDENT_AMBULATORY_CARE_PROVIDER_SITE_OTHER): Payer: Self-pay | Admitting: Nurse Practitioner

## 2022-10-29 NOTE — Telephone Encounter (Signed)
[facility] RN received a phone call message over the weekend from nurse [name] from [facility].  She reported patient had a red eye.    [facility] RN called [facility] to learn more about the patient's condition.  Had to leave a message, the nurse was not available.

## 2022-11-01 NOTE — Telephone Encounter (Signed)
[facility] placed call to [facility], spoke with nurse, to inquire more details about pts eye. [name] states his eye has cleared up, pt has no issues to report at this time.         [facility] [phone]        5 minutes

## 2022-11-06 NOTE — Progress Notes (Deleted)
Date: 11/07/2022    Patient Name: Dillon Wood, Dillon Wood  Code Status: FULL    Patient was seen in an assisted living facility (POS 13) in lieu of an office visit for the following reason:   Requires use of an assistive device in order to ambulate due to unsteady gait.    HPI:   Dillon Wood is a 89 y.o. year old who I am seeing for primary care follow up.     Dillon Wood is a 89 yo M with hx DVT on AC, dementia, BPH, melanoma. Lives with his wife at the [facility]. Ambulates with a walker.      Geriatric focused ROS (yes/no)  Falls in the last year -***  Numbness extremity -***  Mood -***  Memory loss -***  Vision loss -***  Hearing loss -***  Appetite -***  Weight loss -***  Swallowing difficulty -***  Constipation/Diarrhea -***  Incontinence bladder/bowels -***  Arthritis -***  Easy bleeding -***  Pressure ulcer/rash -***  Chest pain -***  Sob/orthopnea/DOE -***     Patient Active Problem List   Diagnosis    Traumatic subarachnoid hemorrhage without loss of consciousness, initial encounter    Squamous cell carcinoma of head and neck    Frequent falls    Carotid artery stenosis    Bladder stone    Memory loss    PVD (peripheral vascular disease)    Neuropathy    Acute deep vein thrombosis (DVT) of femoral vein of left lower extremity    Advanced directives, counseling/discussion    Open wound of plantar aspect of foot, left, sequela    Skin lesions      Past Surgical History:   Procedure Laterality Date    ABDOMINAL SURGERY      Hernia repair     Family History   Problem Relation Age of Onset    Heart disease Father     Stroke Father     Diabetes Maternal Aunt      Social History     Tobacco Use    Smoking status: Never     Passive exposure: Never    Smokeless tobacco: Never   Vaping Use    Vaping status: Never Used   Substance Use Topics    Alcohol use: Yes     Comment: 3 per week    Drug use: No     Allergies   Allergen Reactions    Penicillins      Disorientation       MEDICATIONS:     Current  Outpatient Medications:     apixaban (ELIQUIS) 5 MG, Take 1 tablet (5 mg) by mouth every 12 (twelve) hours, Disp: , Rfl:     aspirin EC 81 MG EC tablet, Take 1 tablet (81 mg total) by mouth daily, Disp: 30 tablet, Rfl: 1    atorvastatin (LIPITOR) 40 MG tablet, Take 1 tablet (40 mg total) by mouth daily, Disp: 90 tablet, Rfl: 3    desonide (DESOWEN) 0.05 % ointment, Apply topically 2 (two) times daily Apply bid to scalp/face until reaction improved, Disp: 60 g, Rfl: 5    mupirocin (BACTROBAN) 2 % ointment, Apply bid to face/scalp lesions until healed, Disp: 22 g, Rfl: 3    niacinamide 500 MG tablet, Take 1 tablet (500 mg) by mouth 2 (two) times daily with meals, Disp: , Rfl:     QUEtiapine (SEROquel) 25 MG tablet, Take 1 tablet (25 mg) by mouth nightly, Disp: 90 tablet, Rfl: 1  tamsulosin (FLOMAX) 0.4 MG Cap, Take 1 capsule (0.4 mg) by mouth Daily after dinner, Disp: , Rfl:     triamcinolone (KENALOG) 0.1 % ointment, Apply bid to eczema on lower body until clear, then use PRN (Patient not taking: Reported on 08/14/2022), Disp: 80 g, Rfl: 4    PHYSICAL EXAM:   There were no vitals taken for this visit.   Wt Readings from Last 3 Encounters:   08/14/22 74.2 kg (163 lb 9.6 oz)   07/23/22 73.8 kg (162 lb 12.8 oz)   07/16/22 76.5 kg (168 lb 11.2 oz)      Ht Readings from Last 3 Encounters:   08/14/22 1.778 m (5\' 10" )   07/23/22 1.778 m (5\' 10" )   07/16/22 1.778 m (5\' 10" )     ***  General: older, frail appearing, alert, no acute distress  HEENT: perrla, eomi, sclera anicteric, oropharynx clear without lesions, mucous membranes moist  Neck: supple, no lymphadenopathy  CV: s1, s2, regular rate and rhythm  Lungs: breathing comfortably, no accessory muscle use  Abd: soft, NT, ND, +bs, no rebound or guarding  Ext: wwp, no edema  Skin: no rashes or lesions noted    DIAGNOSTICS:     Lab Results   Component Value Date    WBC 7.76 08/27/2022    HGB 12.4 (L) 08/27/2022    HCT 37.2 (L) 08/27/2022    PLT 117 (L) 08/27/2022    ALT 15  08/27/2022    AST 15 08/27/2022    NA 140 08/27/2022    K 4.3 08/27/2022    CL 110 08/27/2022    CREAT 1.5 08/27/2022    BUN 26.0 08/27/2022    CO2 23 08/27/2022    INR 1.4 (H) 08/27/2022    GLU 104 (H) 08/27/2022    ALKPHOS 86 08/27/2022     No results found.    ASSESSMENT and PLAN:   No problem-specific Assessment & Plan notes found for this encounter.        There are no diagnoses linked to this encounter.    Electronically Signed by Raeanne Gathers, MD  Geriatric Medicine   [facility] Calls

## 2022-11-07 ENCOUNTER — Encounter (INDEPENDENT_AMBULATORY_CARE_PROVIDER_SITE_OTHER): Payer: Medicare Other | Admitting: Student in an Organized Health Care Education/Training Program

## 2022-11-08 ENCOUNTER — Encounter (INDEPENDENT_AMBULATORY_CARE_PROVIDER_SITE_OTHER): Payer: Medicare Other | Admitting: Nurse Practitioner

## 2022-11-12 ENCOUNTER — Encounter (INDEPENDENT_AMBULATORY_CARE_PROVIDER_SITE_OTHER): Payer: Self-pay

## 2022-11-20 NOTE — Telephone Encounter (Signed)
Placed call to patients son [name] to see if patient was still in need of [facility] services. He reports he lives in [place] and was not sure exactly what patients needs were at this time. He askes that Clinical research associate reach out to patients daughter, [name]. Writer reaches [place] who reports that the ALF said it would be easier if patient saw the 'in house' doctor since they were available 24/7. Writer explained that [facility] can also be reached 24/7 by calling the office number. She expressed understanding and reports at this time patients will be changing PCP's. Writer explains [place] process and states that letters will be mailed to patients at [facility]. Daughter [name] asks that a copy of the letter and release form be emailed to her as she will be assisting patient in requesting [facility] records. Writer obtains email address so that the request form can be emailed to [employer].     [email or web address]

## 2022-11-21 ENCOUNTER — Encounter (INDEPENDENT_AMBULATORY_CARE_PROVIDER_SITE_OTHER): Payer: Self-pay

## 2022-11-27 ENCOUNTER — Telehealth: Payer: Self-pay

## 2022-11-27 NOTE — Telephone Encounter (Signed)
Patients daughter, [name], called to ask if we would be prescribing anxiolytic prior to upcoming PET CT. [name] believes the patient is taking seroquel 25 mg nightly. I couldn't find documentation indicating he needed it last time in February 2024 but after discussing with Clinic RN and Dr. Dickey Gave it was clarified that the patient did not require additional anxiolytic prior to his last PET CT nor was it recommended by Dr. Dickey Gave.     Dr. Dickey Gave did give option for patient to take additional dose of Seroquel prior to PET CT. Discussed with [name]. The patient will continue to take seroquel nightly as directed, he will hold off on taking additional seroquel dose prior to PET CT as it was not necessary during last exam and [name] explains ALF would need additional order to provide any new dosing instructions.     Patient PET CT scheduled 6/7. [name] had no further questions or concerns at this time.

## 2022-11-30 ENCOUNTER — Other Ambulatory Visit (INDEPENDENT_AMBULATORY_CARE_PROVIDER_SITE_OTHER): Payer: Self-pay | Admitting: Student in an Organized Health Care Education/Training Program

## 2022-12-03 ENCOUNTER — Telehealth: Payer: Self-pay

## 2022-12-03 NOTE — Telephone Encounter (Signed)
Daughter, [name] called into triage line.  She asked if patient will need to present to appointment tomorrow or will Dr. Dickey Gave call with results.  Explained that Dr. Dickey Gave will perform physical exam, so patient should present to office tomorrow if patient is able.  Per daughter, patient will arrive to clinic tomorrow.  Daughter requests to be dialed in tomorrow during office visit; phone number for [place] verified.  Dr. Dickey Gave made aware of request.

## 2022-12-04 ENCOUNTER — Encounter: Payer: Self-pay | Admitting: Student in an Organized Health Care Education/Training Program

## 2022-12-04 ENCOUNTER — Ambulatory Visit
Payer: Medicare Other | Attending: Student in an Organized Health Care Education/Training Program | Admitting: Student in an Organized Health Care Education/Training Program

## 2022-12-04 VITALS — BP 131/82 | HR 66 | Temp 97.0°F | Resp 18 | Ht 70.0 in | Wt 165.0 lb

## 2022-12-04 DIAGNOSIS — Z719 Counseling, unspecified: Secondary | ICD-10-CM | POA: Insufficient documentation

## 2022-12-04 DIAGNOSIS — C760 Malignant neoplasm of head, face and neck: Secondary | ICD-10-CM | POA: Insufficient documentation

## 2022-12-04 DIAGNOSIS — L570 Actinic keratosis: Secondary | ICD-10-CM | POA: Insufficient documentation

## 2022-12-04 DIAGNOSIS — C4442 Squamous cell carcinoma of skin of scalp and neck: Secondary | ICD-10-CM

## 2022-12-04 NOTE — Progress Notes (Signed)
[facility]       Patient Name: Dillon Wood   Referring Provider: Hilma Favors, PA    Primary Dx:   1. Squamous cell carcinoma of head and neck           ONCOLOGY CARE TEAM  Mohs Surgery: Rosey Bath  Dermatology: Doran Durand, MD   Derm Onc:  Lily Lovings, MD  Radiation Oncology:  Theadora Rama, MD ([facility])      HISTORY OF PRESENT ILLNESS  Dillon Wood is 89 y.o. 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     Recently moved into assisted living facility with his wife from [place]. When he relocated here, he was seen by his geriatrics team 2/24, at which time the patient's family member noted worsening memory loss over the preceding months, with episodes of agitation. Family expressed concern that this cancer could have spread to his brain. 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 He was previously lined up to pursue endarterectomy in 05/2021 but family changed their minds at the last minute.    Subjective:  Presents today for follow up. Accompanied by his wife today. Very pleasant and jovial on our interaction. Daughter Kayce Shangraw present by phone.    Otherwise feeling well, no major changes in health. No palpable lymphadenopathy, night sweats, weight loss, fatigue, headache, visual changes, dizziness, balance issues, cough, SOB, abd pain or other symptoms at this time. Mobility limited by ulcers on bilateral feet.    Per daughters, has intermittent coughing spells, and has been treated for aspiration PNA multiple times in the past.      Oncology History   Squamous cell carcinoma of head and neck   08/23/2020 Initial Diagnosis    Squamous cell carcinoma of the scalp     09/14/2020 Surgery    Moh's resection (Dr. Rosey Bath, [facility] [place])  Size: 2.5cm  Invasion: 5.60mm, through subQ fat  PNI: + (maximum caliber 0.2mm)     BWH T2b     10/05/2020 - 11/01/2020 Radiation    50 Gy in 20 fractions (Dr. Theadora Rama, [facility], [place])     09/20/2021 Cancer Staged    Staging form: Cutaneous Carcinoma of the Head and Neck, AJCC 8th Edition  - Clinical stage from 09/20/2021: Stage III (cT3, cN0, cM0) - Signed by Algis Greenhouse, MD on 09/20/2021           Allergies   Allergen Reactions    Penicillins      Disorientation     Current Outpatient Medications   Medication Sig Dispense Refill    apixaban (ELIQUIS) 5 MG Take 1 tablet (5 mg) by mouth every 12 (twelve) hours      aspirin EC 81 MG EC tablet Take 1 tablet (81 mg total) by mouth daily 30 tablet 1    atorvastatin (LIPITOR) 40 MG tablet Take 1 tablet (40 mg total) by mouth daily 90 tablet 3    desonide (DESOWEN) 0.05 % ointment Apply topically 2 (two) times daily Apply bid to scalp/face until reaction improved 60 g 5    mupirocin (BACTROBAN)  2 % ointment Apply bid to face/scalp lesions until healed 22 g 3    niacinamide 500 MG tablet Take 1 tablet (500 mg) by mouth 2 (two) times daily with meals      QUEtiapine (SEROquel) 25 MG tablet Take 1 tablet (25 mg) by mouth nightly 90 tablet 1    tamsulosin (FLOMAX) 0.4 MG Cap Take 1 capsule (0.4 mg) by mouth Daily after dinner      triamcinolone (KENALOG) 0.1 % ointment Apply bid to eczema on lower body until clear, then use PRN 80 g 4     No current facility-administered medications for this visit.     Past Medical History:   Diagnosis Date    Closed fracture of left side of maxilla 04/03/2017    DVT (deep venous thrombosis)     Facial contusion, initial encounter 04/03/2017    Gastroesophageal reflux disease     Hyperlipemia     Left orbit  fracture, closed, initial encounter 04/03/2017    PVD (peripheral vascular disease)      Past Surgical History:   Procedure Laterality Date    ABDOMINAL SURGERY      Hernia repair     Family History   Problem Relation Age of Onset    Heart disease Father     Stroke Father     Diabetes Maternal Aunt      Social History     Socioeconomic History    Marital status: Married    Number of children: 4   Occupational History    Occupation: Surveyor, quantity of schools   Tobacco Use    Smoking status: Never     Passive exposure: Never    Smokeless tobacco: Never   Vaping Use    Vaping status: Never Used   Substance and Sexual Activity    Alcohol use: Yes     Comment: 3 per week    Drug use: No     Review of Systems:  Negative except as noted above    Objective:     Vitals:    12/04/22 1315   BP: 131/82   Pulse: 66   Resp: 18   Temp: 97 F (36.1 C)   SpO2: 97%     ECOG PS: 1-2  General: NAD  Eyes: Anicteric, normal conjunctiva  ENT: Poor hearing  CV: RRR  RESP: CTA B  GI: NT/ND, no HSM  Neuro: AAO x 3  MS: no swelling in arms or legs  SKIN:   - left posterior vertex scalp 6 cm well healed scar   - right vertex is curvilinear scar 6 cm left temple graft site.   - sparse actinic damage over L frontal scalp with minor area of hyperkeratosis c/w AK/SCCis  - L temple resection scar w/skin graft  LYMPH: no cervical suplarclavicular axillary lymphadenopathy      08/14/22                LAB DATA:   Lab Results   Component Value Date    WBC 7.76 08/27/2022    HGB 12.4 (L) 08/27/2022    HCT 37.2 (L) 08/27/2022    MCV 93.2 08/27/2022    PLT 117 (L) 08/27/2022     Lab Results   Component Value Date    CREAT 1.5 08/27/2022    BUN 26.0 08/27/2022    NA 140 08/27/2022    K 4.3 08/27/2022    CL 110 08/27/2022    CO2 23 08/27/2022  Lab Results   Component Value Date    ALT 15 08/27/2022    AST 15 08/27/2022    ALKPHOS 86 08/27/2022    BILITOTAL 0.6 08/27/2022       RADIOLOGY DATA:   PET/CT 11/30/22  FINDINGS:   Mediastinum: SUV max 2.6      Liver: SUV max 3.7        HEAD AND NECK: Normal FDG distribution within the nasopharynx, oropharynx,  larynx and thyroid gland. Limited evaluation of the brain parenchyma  demonstrates no discrete foci of increased FDG uptake. No FDG avid cervical  or supraclavicular lymph nodes. Stable rightward nasal septal deviation.     THORAX: Waxing and waning FDG avid bilateral subpleural nodular opacities,  for example:  *  At least 2 new right middle lobe subpleural nodular opacities (series  3 and 4 image 152): up to 1.7 x 1.0 cm with SUV max up to 10.9  *  Right lower lobe nodular opacity (series 3 and 4 image 132): 1.9 x  1.2 cm with SUV max 5.2, previously: 1.8 x 1.5 cm with SUV max 15.5 on  08/10/2022  *  Left lower lobe subpleural nodular opacity (series 3 and 4 image  148): 0.7 x 2.2 cm with SUV max 6.1, previously: 1.2 x 2.6 cm with SUV max  6.7 on 08/10/2022     No significant change of peripheral predominant reticular densities, likely  represents chronic fibrotic changes. No FDG avid thoracic lymph nodes.  Coronary artery calcifications are again seen.     ABDOMEN AND PELVIS: Normal FDG distribution within the liver, spleen,  pancreas, and adrenal glands. No FDG avid abdominopelvic or inguinal lymph  nodes. Again seen are tiny layering calcified gallstones within the  gallbladder. Stable bilateral non-FDG avid renal cysts. Few urinary bladder  diverticula are again seen, for example posteriorly (series 3 and 4 image  284). Stable small left fat-containing inguinal hernia. Colonic  diverticulosis without diverticulitis. Grossly stable mild urinary bladder  wall thickening may be due to underdistention versus less likely cystitis.     MUSCULOSKELETAL/EXTREMITIES: No hypermetabolic foci within the bone marrow.  No aggressive lytic or sclerotic lesion. Multilevel spinal degenerative  changes. Increased uptake surrounding the great toes may be degenerative  and/or inflammatory. Bilateral shoulder degenerative changes,  with  associated uptake.     CUTANEOUS: New tiny cutaneous focus of uptake overlying the left frontal  calvarium (series 3 and 4 image 20): 0.2 cm in thickness with SUV max 2.1.     IMPRESSION:   1.  New tiny cutaneous uptake overlying the left frontal calvarium, may  be inflammatory. Direct visualization to exclude other pathology.  2.  Waxing and waning FDG avid subpleural nodular opacities probably  represents a chronic infectious and/or inflammatory process.  Malignancy/metastasis is less likely, although cannot be excluded.  3.  No evidence of other FDG avid malignancy.      PET/CT 08/14/22  FINDINGS:   Mediastinum: SUV max 3.0  Liver: SUV max 3.6     HEAD AND NECK: Normal FDG uptake. There is no FDG avid lymphadenopathy.  There is nasal septal deviation to the right.     THORAX: There are multiple FDG avid nodular opacities within the right  upper lobe and bilateral lower lobes. The most FDG avid (SUV max 15.5)  nodular opacity is located within the right upper lobe (image 127)  measuring 1.3 cm. Additional FDG avid opacities are located within the  right upper lobe (SUV max  4.8) (image 132) measuring 1.1 x 0.9 cm, right  upper lobe (SUV max 4.5 laterally) (image 122) measuring 1.0 x 0.8 cm and  (SUV max 5.0 medially) (image 122) measuring 0.5 cm and left lower lobe  (SUV max 6.7) (image 150) measuring 1.6 x 1.2 cm. There are scattered  increased interstitial lung markings compatible with chronic  change/fibrosis.     ABDOMEN AND PELVIS: Normal FDG uptake. There are bilateral renal cysts.  There are colonic diverticula without evidence of diverticulitis. The  prostate is enlarged. There is herniation of fat into the left inguinal  canal. There is a urinary bladder diverticulum located anterosuperiorly on  the left.     MUSCULOSKELETAL/EXTREMITIES: Normal FDG uptake. Multilevel degenerative  changes are noted within the spine.     IMPRESSION:   Multiple FDG avid nodular opacities within the lungs as  described  compatible with inflammation/infection and/or malignancy. No  evidence of FDG avid lymphadenopathy.      MRI Brain w/ and w/o 10/12/21  FINDINGS:   There are no abnormal fluid collections. There are no masses. There is  no mass effect or midline shift. Ventricles and sulci are  age-appropriate. Chronic ischemic changes are present.  Diffusion-weighted imaging is unremarkable. There is no evidence of  acute infarction. Scattered foci of hemosiderin deposition are present  secondary to chronic microhemorrhage. The gray-white matter junctions  are unremarkable. Clival and calvarial marrow signal is normal. Upon the  administration of intravenous contrast, there is no abnormal  enhancement.     There appears to be a 1.1 cm lesion in the right parotid gland deep  lobe.    IMPRESSION:   1. No acute intracranial process.  2. No mass, hydrocephalus, or pathologic fluid collection.  3. No acute infarct.  4. Chronic ischemic changes and age-appropriate volume are present.   5. There is a 1.1 cm lesion in the right parotid gland deep lobe. Its  imaging characteristics suggest a benign cyst however the lesion is  nonspecific      MRA Neck 10/12/21  IMPRESSION:   1.Stable MRA neck.  2.Again present is a greater than 90% stenosis of the right carotid bulb  by NASCET criteria. Extensive atherosclerotic disease throughout the  remainder of the cervical right ICA is noted with multifocal moderate  grade stenoses.  3.There is 62% stenosis of the proximal left internal carotid artery  based on NASCET criteria.  4.The vertebral arteries remain patent      PATHOLOGY DATA:  CT-Guided RUL Pulm Nodule Bx 08/28/22  DIAGNOSIS:        LUNG, RIGHT LUNG NODULE, CT-GUIDED BIOPSY WITH ON-SITE ASSESSMENT:    MILDLY INFLAMED LUNG PARENCHYMA; SEE COMMENT        COMMENT:    Biopsy cores are minute and show mild chronic inflammation including a    lymphoid aggregate in block #2, confirmed by positive    immunohistochemical staining for CD45 and negative  staining for    pancytokeratin.  Controls stain appropriately.  The biopsy may not be    representative of the lung nodule.        09/03/22   From liquid media only Staphylococcus warneri       _____________________________________________________________________________                                   S.warneri      ANTIBIOTICS  MIC  INTRP      _____________________________________________________________________________   Clindamycin                   <=0.5   S        Erythromycin                    >4     R        Gentamicin                      <=2    S        Levofloxacin                    <=1    S        Oxacillin                     <=0.25   S  D1    Rifampin                       <=0.5   S        Tetracycline                   <=0.5   S  D2    Vancomycin                       1     S           <Electronically Signed>    Lanny Hurst, MD    08/31/2022 14:56         Vertex Scalp Lesion Bx 07/2020:  Final Diagnosis      A. Skin, biopsy, left vertex scalp:  -  SQUAMOUS CELL CARCINOMA, WELL DIFFERENTIATED      B. Skin, biopsy, left forearm:  -  SQUAMOUS CELL CARCINOMA, WELL DIFFERENTIATED (ACANTHOLYTIC)      Electronically signed by Lanice Schwab, MD on 08/16/2020 at 12:57 PM         Final Diagnosis      A. Skin, Mohs debulk, left vertex scalp:  -  SQUAMOUS CELL CARCINOMA, WELL DIFFERENTIATED     NOTE: The tumor invades to a depth of at least 5.5 mm and is present at the base of the specimen. There is perineural invasion, with maximum nerve caliber 0.09 mm. No lymphovascular invasion is seen.      B. Skin, Mohs stage 1, left vertex scalp:  -  SQUAMOUS CELL CARCINOMA, WELL DIFFERENTIATED     NOTE: No perineural or lymphovascular invasion is seen.          Assessment & Plan:     1. 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 Bacterial culture grew out Staph warneri, a common skin and oral flora bacteria, which may have been aspirated (particularly with his advancing dementia) causing a pneumonia picture on CT scans; completed doxycycline 200mg  PO BID x10d (3/11-3/21/24).  -  Updated PET/CT 11/30/22 showing waxing/waning sub-pleural lung nodules, overall not c/f malignant process, likely intermittent aspiration PNA    --- Discussed that this may recur in the future, as this is a common problem for older adults with dementia    --- Consider SLP consult for aspiration diet recommendations  - Now >61yrs since initial high risk cSCC diagnosis and treatment, no additional surveillance imaging recommended or med onc follow up required  - Briefly discussed that single agent PD1-inhibitors could be considered for treatment of metastatic disease, with due caution given advanced age, comorbidities, and poor tolerance to any irAE    2. Extensive actinic damage and keratosis scalp forehead, c/f field cancerization  - s/p Effudex cream with sporadic use  - Appreciate recommendations from Dr. Sherlean Foot   - continue dermatologic surveillance with TBSE q3-64mo with Dr. Kandice Moos and Dr. Sherlean Foot (can alternate between them).    RTC with med onc PRN        Nathifa Ritthaler Al-Mondhiry, MD, MA  Medical Oncologist, Cutaneous Malignancies Program  [facility]  [id number]  [place], [place] [address]  Phone: [phone]  Fax: [phone]

## 2022-12-28 ENCOUNTER — Ambulatory Visit: Payer: Medicare Other | Admitting: Family

## 2023-02-13 ENCOUNTER — Telehealth: Payer: Self-pay

## 2023-02-13 NOTE — Telephone Encounter (Signed)
I received a call from Amie K. LPN at [facility] today asking for Efudex order as requested by [name], the patients daughter. I called [name], she was under the impression efudex would be used almost indefinitely.   Per notes he was advised by Dr. Sherlean Foot to use for 4 weeks in January 2024 after cryotherapy sites had healed. Due to premature efudex application he had severe reaction in February 2024 prompting the stop of Efudex.     Reached out to Dr. Sherlean Foot to ask whether this patient should continue efudex application and be set up for follow up (had follow up in July that was canceled).  [name] understood this RN would follow up with her.     Heber Carolina LPN  Telephone: [phone]  Fax: [phone]- [phone]

## 2023-02-18 NOTE — Telephone Encounter (Signed)
Dr. Sherlean Foot clarified efudex application should be only 2-4 weeks. [name], the patients daughter, is aware. [name] expressed concern for new spots to patients scalp. The patient is overdue for TBSE, next available with the [facility] clinic is mid September 2024. [name] understands this RN will discuss further with Dr. Sherlean Foot, based on Dr. Tobin Chad review she should expect a call from our Front Desk Team to coordinate exam.     [name] had no further questions or concerns.

## 2023-03-13 ENCOUNTER — Ambulatory Visit: Payer: Medicare Other | Admitting: Student in an Organized Health Care Education/Training Program

## 2023-03-21 ENCOUNTER — Ambulatory Visit: Payer: Medicare Other | Admitting: Student in an Organized Health Care Education/Training Program

## 2023-04-01 ENCOUNTER — Encounter: Payer: Self-pay | Admitting: Student in an Organized Health Care Education/Training Program

## 2023-04-01 ENCOUNTER — Ambulatory Visit
Payer: Medicare Other | Attending: Student in an Organized Health Care Education/Training Program | Admitting: Student in an Organized Health Care Education/Training Program

## 2023-04-01 VITALS — BP 119/73 | HR 83 | Temp 98.1°F | Resp 15 | Ht 70.0 in | Wt 159.0 lb

## 2023-04-01 DIAGNOSIS — Z719 Counseling, unspecified: Secondary | ICD-10-CM | POA: Insufficient documentation

## 2023-04-01 DIAGNOSIS — C4442 Squamous cell carcinoma of skin of scalp and neck: Secondary | ICD-10-CM | POA: Insufficient documentation

## 2023-04-01 DIAGNOSIS — C760 Malignant neoplasm of head, face and neck: Secondary | ICD-10-CM | POA: Insufficient documentation

## 2023-04-01 NOTE — Progress Notes (Signed)
[facility]       Patient Name: Dillon Wood   Referring Provider: Carmie Kanner Fe Fe *    Primary Dx:   1. Squamous cell carcinoma of head and neck          ONCOLOGY CARE TEAM  Mohs Surgery: Rosey Bath  Dermatology: Doran Durand, MD   Derm Onc:  Lily Lovings, MD  Radiation Oncology:  Theadora Rama, MD ([facility])      HISTORY OF PRESENT ILLNESS  Dillon Wood is 89 y.o. 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     Recently moved into assisted living facility with his wife from [place]. When he relocated here, he was seen by his geriatrics team 2/24, at which time the patient's family member noted worsening memory loss over the preceding months, with episodes of agitation. Family expressed concern that this cancer could have spread to his brain. 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 He was previously lined up to pursue endarterectomy in 05/2021 but family changed their minds at the last minute.    Subjective:  Presents today for follow up. Accompanied by his wife today. He and his wife live in a >106 year old community, who helps establish follow up appointments. They report being told to come back for follow up today (no such need on our end based on our last visit).     No oncologic complaints today. Very pleasant and jovial on our interaction. Often forgetful during the visit, but states he overall feels well. Reports no major changes in health.  Per wife, Dillon Wood is becoming more forgetful and his strength has deteriorated in the last couple of months.  No reported falls. His weight is stable.     Mr. Andringa is overdue for a TBSE, but per wife they have not made a follow up with Dr. Sherlean Foot yet.     Per patient and wife, no worsening cough or SOB. No coughing spells during or after meals. No dysphagia noted by patient or his wife. No falls. Mostly sedentary, moves around the retirement community in a motorized scooter.    He does not endorse any new concerning lesions on his scalp.     Oncology History   Squamous cell carcinoma of head and neck   08/23/2020 Initial Diagnosis    Squamous cell carcinoma of the scalp     09/14/2020 Surgery    Moh's resection (Dr. Rosey Bath, [facility], [place])  Size: 2.5cm  Invasion: 5.64mm, through subQ fat  PNI: + (maximum caliber 0.15mm)     BWH T2b     10/05/2020 - 11/01/2020 Radiation    50 Gy in 20 fractions (Dr. Theadora Rama, [facility], [place])     09/20/2021 Cancer Staged    Staging form: Cutaneous Carcinoma of the Head and Neck, AJCC 8th Edition  - Clinical stage from 09/20/2021: Stage III (cT3, cN0, cM0) - Signed by Algis Greenhouse, MD on 09/20/2021           Allergies   Allergen Reactions    Penicillins      Disorientation- per patient,  no allergy as of 03/7023     Current Outpatient Medications   Medication Sig Dispense Refill    apixaban (ELIQUIS) 5 MG Take 1 tablet (5 mg) by mouth every 12 (twelve) hours      aspirin EC 81 MG EC tablet Take 1 tablet (81 mg total) by mouth daily 30 tablet 1    atorvastatin (LIPITOR) 40 MG tablet Take 1 tablet (40 mg total) by mouth daily 90 tablet 3    desonide (DESOWEN) 0.05 % ointment Apply topically 2 (two) times daily Apply bid to scalp/face until reaction improved 60 g 5    mupirocin (BACTROBAN) 2 % ointment Apply bid to face/scalp lesions until healed 22 g 3    niacinamide 500 MG tablet Take 1 tablet (500 mg) by mouth 2 (two) times daily with meals      tamsulosin (FLOMAX) 0.4 MG Cap Take 1 capsule (0.4 mg) by mouth Daily after dinner      triamcinolone (KENALOG) 0.1 %  ointment Apply bid to eczema on lower body until clear, then use PRN 80 g 4    QUEtiapine (SEROquel) 25 MG tablet Take 1 tablet (25 mg) by mouth nightly 90 tablet 1     No current facility-administered medications for this visit.     Past Medical History:   Diagnosis Date    Closed fracture of left side of maxilla 04/03/2017    DVT (deep venous thrombosis)     Facial contusion, initial encounter 04/03/2017    Gastroesophageal reflux disease     Hyperlipemia     Left orbit fracture, closed, initial encounter 04/03/2017    PVD (peripheral vascular disease)      Past Surgical History:   Procedure Laterality Date    ABDOMINAL SURGERY      Hernia repair     Family History   Problem Relation Name Age of Onset    Heart disease Father      Stroke Father      Diabetes Maternal Aunt       Social History     Socioeconomic History    Marital status: Married    Number of children: 4   Occupational History    Occupation: Surveyor, [employer] of schools   Tobacco Use    Smoking status: Never     Passive exposure: Never    Smokeless tobacco: Never   Vaping Use    Vaping status: Never Used   Substance and Sexual Activity    Alcohol use: Yes     Comment: 3 per week    Drug use: No     Review of Systems:  Negative except as noted above    Objective:     Vitals:    04/01/23 1418   BP: 119/73   Pulse: 83   Resp: 15   Temp: 98.1 F (36.7 C)   SpO2: 92%       ECOG PS: 1-2  General: NAD  Eyes: Anicteric, normal conjunctiva  ENT: Poor hearing  CV: RRR  RESP: CTA B  GI: NT/ND, no HSM  Neuro: AAO x 3  MS: no swelling in arms or legs  SKIN:   - left posterior vertex scalp 6 cm well healed scar   - right vertex is curvilinear scar 6 cm left temple graft site.   - sparse actinic damage over L frontal scalp with minor area of hyperkeratosis c/w AK/SCCis  - L temple resection scar w/skin graft  LYMPH: no cervical suplarclavicular axillary lymphadenopathy  08/14/22                LAB DATA:   Lab Results   Component Value Date    WBC 7.76 08/27/2022     HGB 12.4 (L) 08/27/2022    HCT 37.2 (L) 08/27/2022    MCV 93.2 08/27/2022    PLT 117 (L) 08/27/2022     Lab Results   Component Value Date    CREAT 1.5 08/27/2022    BUN 26.0 08/27/2022    NA 140 08/27/2022    K 4.3 08/27/2022    CL 110 08/27/2022    CO2 23 08/27/2022     Lab Results   Component Value Date    ALT 15 08/27/2022    AST 15 08/27/2022    ALKPHOS 86 08/27/2022    BILITOTAL 0.6 08/27/2022       RADIOLOGY DATA:   PET/CT 11/30/22  FINDINGS:   Mediastinum: SUV max 2.6     Liver: SUV max 3.7        HEAD AND NECK: Normal FDG distribution within the nasopharynx, oropharynx,  larynx and thyroid gland. Limited evaluation of the brain parenchyma  demonstrates no discrete foci of increased FDG uptake. No FDG avid cervical  or supraclavicular lymph nodes. Stable rightward nasal septal deviation.     THORAX: Waxing and waning FDG avid bilateral subpleural nodular opacities,  for example:  *  At least 2 new right middle lobe subpleural nodular opacities (series  3 and 4 image 152): up to 1.7 x 1.0 cm with SUV max up to 10.9  *  Right lower lobe nodular opacity (series 3 and 4 image 132): 1.9 x  1.2 cm with SUV max 5.2, previously: 1.8 x 1.5 cm with SUV max 15.5 on  08/10/2022  *  Left lower lobe subpleural nodular opacity (series 3 and 4 image  148): 0.7 x 2.2 cm with SUV max 6.1, previously: 1.2 x 2.6 cm with SUV max  6.7 on 08/10/2022     No significant change of peripheral predominant reticular densities, likely  represents chronic fibrotic changes. No FDG avid thoracic lymph nodes.  Coronary artery calcifications are again seen.     ABDOMEN AND PELVIS: Normal FDG distribution within the liver, spleen,  pancreas, and adrenal glands. No FDG avid abdominopelvic or inguinal lymph  nodes. Again seen are tiny layering calcified gallstones within the  gallbladder. Stable bilateral non-FDG avid renal cysts. Few urinary bladder  diverticula are again seen, for example posteriorly (series 3 and 4 image  284). Stable small  left fat-containing inguinal hernia. Colonic  diverticulosis without diverticulitis. Grossly stable mild urinary bladder  wall thickening may be due to underdistention versus less likely cystitis.     MUSCULOSKELETAL/EXTREMITIES: No hypermetabolic foci within the bone marrow.  No aggressive lytic or sclerotic lesion. Multilevel spinal degenerative  changes. Increased uptake surrounding the great toes may be degenerative  and/or inflammatory. Bilateral shoulder degenerative changes, with  associated uptake.     CUTANEOUS: New tiny cutaneous focus of uptake overlying the left frontal  calvarium (series 3 and 4 image 20): 0.2 cm in thickness with SUV max 2.1.     IMPRESSION:   1.  New tiny cutaneous uptake overlying the left frontal calvarium, may  be inflammatory. Direct visualization to exclude other pathology.  2.  Waxing and waning FDG avid subpleural nodular opacities probably  represents a chronic infectious and/or inflammatory process.  Malignancy/metastasis is less likely, although cannot be excluded.  3.  No evidence of other FDG avid malignancy.      PET/CT 08/14/22  FINDINGS:   Mediastinum: SUV max 3.0  Liver: SUV max 3.6     HEAD AND NECK: Normal FDG uptake. There is no FDG avid lymphadenopathy.  There is nasal septal deviation to the right.     THORAX: There are multiple FDG avid nodular opacities within the right  upper lobe and bilateral lower lobes. The most FDG avid (SUV max 15.5)  nodular opacity is located within the right upper lobe (image 127)  measuring 1.3 cm. Additional FDG avid opacities are located within the  right upper lobe (SUV max 4.8) (image 132) measuring 1.1 x 0.9 cm, right  upper lobe (SUV max 4.5 laterally) (image 122) measuring 1.0 x 0.8 cm and  (SUV max 5.0 medially) (image 122) measuring 0.5 cm and left lower lobe  (SUV max 6.7) (image 150) measuring 1.6 x 1.2 cm. There are scattered  increased interstitial lung markings compatible with chronic  change/fibrosis.     ABDOMEN AND  PELVIS: Normal FDG uptake. There are bilateral renal cysts.  There are colonic diverticula without evidence of diverticulitis. The  prostate is enlarged. There is herniation of fat into the left inguinal  canal. There is a urinary bladder diverticulum located anterosuperiorly on  the left.     MUSCULOSKELETAL/EXTREMITIES: Normal FDG uptake. Multilevel degenerative  changes are noted within the spine.     IMPRESSION:   Multiple FDG avid nodular opacities within the lungs as  described compatible with inflammation/infection and/or malignancy. No  evidence of FDG avid lymphadenopathy.      MRI Brain w/ and w/o 10/12/21  FINDINGS:   There are no abnormal fluid collections. There are no masses. There is  no mass effect or midline shift. Ventricles and sulci are  age-appropriate. Chronic ischemic changes are present.  Diffusion-weighted imaging is unremarkable. There is no evidence of  acute infarction. Scattered foci of hemosiderin deposition are present  secondary to chronic microhemorrhage. The gray-white matter junctions  are unremarkable. Clival and calvarial marrow signal is normal. Upon the  administration of intravenous contrast, there is no abnormal  enhancement.     There appears to be a 1.1 cm lesion in the right parotid gland deep  lobe.    IMPRESSION:   1. No acute intracranial process.  2. No mass, hydrocephalus, or pathologic fluid collection.  3. No acute infarct.  4. Chronic ischemic changes and age-appropriate volume are present.   5. There is a 1.1 cm lesion in the right parotid gland deep lobe. Its  imaging characteristics suggest a benign cyst however the lesion is  nonspecific      MRA Neck 10/12/21  IMPRESSION:   1.Stable MRA neck.  2.Again present is a greater than 90% stenosis of the right carotid bulb  by NASCET criteria. Extensive atherosclerotic disease throughout the  remainder of the cervical right ICA is noted with multifocal moderate  grade stenoses.  3.There is 62% stenosis of the proximal  left internal carotid artery  based on NASCET criteria.  4.The vertebral arteries remain patent      PATHOLOGY DATA:  CT-Guided RUL Pulm Nodule Bx 08/28/22  DIAGNOSIS:        LUNG, RIGHT LUNG NODULE, CT-GUIDED BIOPSY WITH ON-SITE ASSESSMENT:    MILDLY INFLAMED LUNG PARENCHYMA; SEE COMMENT        COMMENT:    Biopsy cores are minute and show mild chronic inflammation including a    lymphoid aggregate  in block #2, confirmed by positive    immunohistochemical staining for CD45 and negative staining for    pancytokeratin.  Controls stain appropriately.  The biopsy may not be    representative of the lung nodule.        09/03/22   From liquid media only Staphylococcus warneri       _____________________________________________________________________________                                   S.warneri      ANTIBIOTICS                     MIC  INTRP      _____________________________________________________________________________   Clindamycin                   <=0.5   S        Erythromycin                    >4     R        Gentamicin                      <=2    S        Levofloxacin                    <=1    S        Oxacillin                     <=0.25   S  D1    Rifampin                       <=0.5   S        Tetracycline                   <=0.5   S  D2    Vancomycin                       1     S           <Electronically Signed>    Lanny Hurst, MD    08/31/2022 14:56         Vertex Scalp Lesion Bx 07/2020:  Final Diagnosis      A. Skin, biopsy, left vertex scalp:  -  SQUAMOUS CELL CARCINOMA, WELL DIFFERENTIATED      B. Skin, biopsy, left forearm:  -  SQUAMOUS CELL CARCINOMA, WELL DIFFERENTIATED (ACANTHOLYTIC)      Electronically signed by Lanice Schwab, MD on 08/16/2020 at 12:57 PM         Final Diagnosis      A. Skin, Mohs debulk, left vertex scalp:  -  SQUAMOUS CELL CARCINOMA, WELL DIFFERENTIATED     NOTE: The tumor invades to a depth of at least 5.5 mm and is present at the base of the specimen. There is  perineural invasion, with maximum nerve caliber 0.09 mm. No lymphovascular invasion is seen.      B. Skin, Mohs stage 1, left vertex scalp:  -  SQUAMOUS CELL CARCINOMA, WELL DIFFERENTIATED     NOTE: No perineural or lymphovascular invasion is seen.          Assessment & Plan:     1. 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 Bacterial culture grew out Staph warneri, a common skin and oral flora bacteria, which may have been aspirated (particularly with his advancing dementia) causing a pneumonia picture on CT scans; completed doxycycline 200mg  PO BID x10d (3/11-3/21/24).  - PET/CT 11/30/22 showing waxing/waning sub-pleural lung nodules, overall not c/f malignant process, likely intermittent aspiration PNA    --- Discussed that this may recur in the future, as this is a common problem for older adults with dementia    --- discussed considering SLP consult for aspiration diet recommendations    --- stable per patient at today's visit  - Now >53yrs since initial high risk cSCC diagnosis and treatment, no additional surveillance imaging recommended or med onc follow up required  - At prior visit, briefly discussed that single agent PD1-inhibitors could be considered for treatment of metastatic disease, with due caution given advanced age, comorbidities, and poor tolerance to any irAE    2. Extensive actinic damage and keratosis scalp forehead, c/f field cancerization  - s/p Effudex cream with sporadic use  - Previously followed with Dr. Sherlean Foot,  overdue for TBSE.   - Continue dermatologic surveillance with TBSE q3-34mo with Dr. Kandice Moos and Dr. Sherlean Foot (can alternate between them)- reinforced at todays visit, and recommended follow up with Dr. Sherlean Foot in the next 3 mo.     RTC with med onc PRN    Discussed with supervising attending Dr. Dickey Gave.     Leanne Chang, DO  Hematology/Oncology Fellow  [facility]  [address]  [place], [place] [address]        ATTENDING ATTESTATION:  I have personally interviewed and examined the patient.  I have reviewed the provider's history, exam, assessment and management plans. I concur with or have edited all elements of the provider's note. We spent in the care of this patient, over 50% of which was dedicated to counseling and coordination of care. Discussions included the diagnosis, prognosis and treatment options for this disease.      Lynnix Schoneman Al-Mondhiry, MD, MA  Medical Oncologist, Cutaneous Malignancies Program  Assistant Professor of Medical Education, [employer]  [facility]  [address], [id number]  [place], [place] [address]  Phone: [phone]  Fax: [phone]

## 2023-04-22 ENCOUNTER — Ambulatory Visit: Payer: Medicare Other | Admitting: Dermatology

## 2023-05-20 ENCOUNTER — Emergency Department: Payer: Medicare Other

## 2023-05-20 ENCOUNTER — Inpatient Hospital Stay
Admission: EM | Admit: 2023-05-20 | Discharge: 2023-05-28 | DRG: 356 | Disposition: A | Discharge status: Hospice | Payer: Medicare Other | Attending: Internal Medicine | Admitting: Internal Medicine

## 2023-05-20 DIAGNOSIS — R000 Tachycardia, unspecified: Secondary | ICD-10-CM | POA: Diagnosis present

## 2023-05-20 DIAGNOSIS — F05 Delirium due to known physiological condition: Secondary | ICD-10-CM | POA: Diagnosis not present

## 2023-05-20 DIAGNOSIS — E785 Hyperlipidemia, unspecified: Secondary | ICD-10-CM | POA: Diagnosis present

## 2023-05-20 DIAGNOSIS — K5731 Diverticulosis of large intestine without perforation or abscess with bleeding: Principal | ICD-10-CM | POA: Diagnosis present

## 2023-05-20 DIAGNOSIS — K922 Gastrointestinal hemorrhage, unspecified: Principal | ICD-10-CM | POA: Diagnosis present

## 2023-05-20 DIAGNOSIS — D696 Thrombocytopenia, unspecified: Secondary | ICD-10-CM | POA: Diagnosis present

## 2023-05-20 DIAGNOSIS — F0390 Unspecified dementia without behavioral disturbance: Secondary | ICD-10-CM

## 2023-05-20 DIAGNOSIS — Z860100 Personal history of colon polyps, unspecified: Secondary | ICD-10-CM

## 2023-05-20 DIAGNOSIS — Z9181 History of falling: Secondary | ICD-10-CM

## 2023-05-20 DIAGNOSIS — E861 Hypovolemia: Secondary | ICD-10-CM | POA: Diagnosis present

## 2023-05-20 DIAGNOSIS — I129 Hypertensive chronic kidney disease with stage 1 through stage 4 chronic kidney disease, or unspecified chronic kidney disease: Secondary | ICD-10-CM | POA: Diagnosis present

## 2023-05-20 DIAGNOSIS — R1312 Dysphagia, oropharyngeal phase: Secondary | ICD-10-CM | POA: Diagnosis present

## 2023-05-20 DIAGNOSIS — R578 Other shock: Secondary | ICD-10-CM | POA: Diagnosis present

## 2023-05-20 DIAGNOSIS — Z86718 Personal history of other venous thrombosis and embolism: Secondary | ICD-10-CM

## 2023-05-20 DIAGNOSIS — G9341 Metabolic encephalopathy: Secondary | ICD-10-CM | POA: Diagnosis present

## 2023-05-20 DIAGNOSIS — R079 Chest pain, unspecified: Secondary | ICD-10-CM

## 2023-05-20 DIAGNOSIS — F03B0 Unspecified dementia, moderate, without behavioral disturbance, psychotic disturbance, mood disturbance, and anxiety: Secondary | ICD-10-CM | POA: Diagnosis present

## 2023-05-20 DIAGNOSIS — I739 Peripheral vascular disease, unspecified: Secondary | ICD-10-CM | POA: Diagnosis present

## 2023-05-20 DIAGNOSIS — Z7901 Long term (current) use of anticoagulants: Secondary | ICD-10-CM

## 2023-05-20 DIAGNOSIS — Z8249 Family history of ischemic heart disease and other diseases of the circulatory system: Secondary | ICD-10-CM

## 2023-05-20 DIAGNOSIS — H9190 Unspecified hearing loss, unspecified ear: Secondary | ICD-10-CM | POA: Diagnosis present

## 2023-05-20 DIAGNOSIS — D62 Acute posthemorrhagic anemia: Secondary | ICD-10-CM | POA: Diagnosis present

## 2023-05-20 DIAGNOSIS — N1832 Chronic kidney disease, stage 3b: Secondary | ICD-10-CM | POA: Diagnosis present

## 2023-05-20 DIAGNOSIS — Z85828 Personal history of other malignant neoplasm of skin: Secondary | ICD-10-CM

## 2023-05-20 DIAGNOSIS — Z79899 Other long term (current) drug therapy: Secondary | ICD-10-CM

## 2023-05-20 DIAGNOSIS — Z7982 Long term (current) use of aspirin: Secondary | ICD-10-CM

## 2023-05-20 DIAGNOSIS — Z95820 Peripheral vascular angioplasty status with implants and grafts: Secondary | ICD-10-CM

## 2023-05-20 DIAGNOSIS — R32 Unspecified urinary incontinence: Secondary | ICD-10-CM | POA: Diagnosis present

## 2023-05-20 LAB — LAB USE ONLY - CBC WITH DIFFERENTIAL
Absolute Basophils: 0.04 10*3/uL (ref 0.00–0.08)
Absolute Eosinophils: 0.49 10*3/uL — ABNORMAL HIGH (ref 0.00–0.44)
Absolute Immature Granulocytes: 0.05 10*3/uL (ref 0.00–0.07)
Absolute Lymphocytes: 1.42 10*3/uL (ref 0.42–3.22)
Absolute Monocytes: 0.68 10*3/uL (ref 0.21–0.85)
Absolute Neutrophils: 6.89 10*3/uL — ABNORMAL HIGH (ref 1.10–6.33)
Absolute nRBC: 0 10*3/uL (ref <=0.00)
Basophils %: 0.4 %
Eosinophils %: 5.1 %
Hematocrit: 27.4 % — ABNORMAL LOW (ref 37.6–49.6)
Hemoglobin: 9.1 g/dL — ABNORMAL LOW (ref 12.5–17.1)
Immature Granulocytes %: 0.5 %
Lymphocytes %: 14.8 %
MCH: 31.5 pg (ref 25.1–33.5)
MCHC: 33.2 g/dL (ref 31.5–35.8)
MCV: 94.8 fL (ref 78.0–96.0)
MPV: 8.7 fL — ABNORMAL LOW (ref 8.9–12.5)
Monocytes %: 7.1 %
Neutrophils %: 72.1 %
Platelet Count: 116 10*3/uL — ABNORMAL LOW (ref 142–346)
Preliminary Absolute Neutrophil Count: 6.89 10*3/uL — ABNORMAL HIGH (ref 1.10–6.33)
RBC: 2.89 10*6/uL — ABNORMAL LOW (ref 4.20–5.90)
RDW: 14 % (ref 11–15)
WBC: 9.57 10*3/uL — ABNORMAL HIGH (ref 3.10–9.50)
nRBC %: 0 /100{WBCs} (ref <=0.0)

## 2023-05-20 LAB — COMPREHENSIVE METABOLIC PANEL
ALT: 14 U/L (ref <=55)
AST (SGOT): 19 U/L (ref <=41)
Albumin/Globulin Ratio: 1.2 (ref 0.9–2.2)
Albumin: 2.9 g/dL — ABNORMAL LOW (ref 3.5–5.0)
Alkaline Phosphatase: 89 U/L (ref 37–117)
Anion Gap: 9 (ref 5.0–15.0)
BUN: 23 mg/dL (ref 9–28)
Bilirubin, Total: 0.5 mg/dL (ref 0.2–1.2)
CO2: 22 meq/L (ref 17–29)
Calcium: 8.5 mg/dL (ref 7.9–10.2)
Chloride: 108 meq/L (ref 99–111)
Creatinine: 1.6 mg/dL — ABNORMAL HIGH (ref 0.5–1.5)
GFR: 40.4 mL/min/{1.73_m2} — ABNORMAL LOW (ref >=60.0)
Globulin: 2.5 g/dL (ref 2.0–3.6)
Glucose: 133 mg/dL — ABNORMAL HIGH (ref 70–100)
Potassium: 4 meq/L (ref 3.5–5.3)
Protein, Total: 5.4 g/dL — ABNORMAL LOW (ref 6.0–8.3)
Sodium: 139 meq/L (ref 135–145)

## 2023-05-20 LAB — LACTIC ACID: Whole Blood Lactic Acid: 2.3 mmol/L — ABNORMAL HIGH (ref 0.2–2.0)

## 2023-05-20 LAB — APTT: PTT: 32 s (ref 27–39)

## 2023-05-20 LAB — PT/INR
INR: 2 (ref 0.9–1.1)
PT: 22.6 s — ABNORMAL HIGH (ref 10.1–12.9)

## 2023-05-20 LAB — HIGH SENSITIVITY TROPONIN-I: hs Troponin: <2.7 ng/L (ref <=35.0)

## 2023-05-20 MED ORDER — SODIUM CHLORIDE 0.9 % IV BOLUS
1000.0000 mL | Freq: Once | INTRAVENOUS | Status: AC
Start: 2023-05-20 — End: 2023-05-21
  Administered 2023-05-20: 1000 mL via INTRAVENOUS

## 2023-05-20 NOTE — ED Notes (Signed)
Bed: BL23  Expected date:   Expected time:   Means of arrival:   Comments:  M,209

## 2023-05-20 NOTE — ED Provider Notes (Signed)
EMERGENCY DEPARTMENT HISTORY AND PHYSICAL EXAM     None        Date: 05/20/2023  Patient Name: Dillon Wood    History of Presenting Illness     Chief Complaint   Patient presents with    Rectal Bleeding       History Provided By: Patient, Patient's Wife, and EMS      Additional History: Dillon Wood is a 89 y.o. male presenting to the ED with a chief complaint of rectal bleeding and fall.  Patient has a history of dementia, some history is obtained from his wife.  She reports that patient is incontinent at baseline, wears a diaper with underwear over the diaper, she has noticed dried blood in his underwear over the past few days.  This evening had another episode of bright red blood per rectum, approximately two quarters worth.  Patient was in the bathroom and she went to check on him, he seemed acutely weak and had had a ground-level fall, she found him on the ground.  She did not see the event, patient denies that he hit his head.  He has no specific complaints, wife reports that he seems weak and fatigued.  Chronically confused, not different today.  Prior history of DVT and is on Eliquis.  No prior history of GI bleeds.    PCP: No primary care provider on file.  SPECIALISTS:    Current Medications[1]    History of Present Illness      Past History     Past Medical History:  Past Medical History:   Diagnosis Date    Closed fracture of left side of maxilla 04/03/2017    DVT (deep venous thrombosis)     Facial contusion, initial encounter 04/03/2017    Gastroesophageal reflux disease     Hyperlipemia     Left orbit fracture, closed, initial encounter 04/03/2017    PVD (peripheral vascular disease)        Past Surgical History:  Past Surgical History[2]    Family History:  Family History[3]    Social History:  Social History[4]    Allergies:  Allergies[5]    Review of Systems     ***    Review of Systems   Unable to perform ROS: Dementia         Physical Exam   BP 92/51   Pulse 77   Temp 97.3  F (36.3 C) (Oral)   Resp 16   Ht 5\' 9"  (1.753 m)   Wt 70.7 kg   SpO2 97%   BMI 23.01 kg/m     ***    Physical Exam  Vitals and nursing note reviewed.   Constitutional:       Comments: Awake and alert, pale and ill-appearing   HENT:      Head: Normocephalic and atraumatic.      Right Ear: External ear normal.      Left Ear: External ear normal.      Nose: Nose normal.      Mouth/Throat:      Mouth: Mucous membranes are dry.   Eyes:      Pupils: Pupils are equal, round, and reactive to light.   Neurological:      Mental Status: He is alert.         Physical Exam        Diagnostic Study Results     Labs -     Results       Procedure  Component Value Units Date/Time    Lactic Acid [[id number]] Collected: 05/20/23 2313    Specimen: Blood, Venous Updated: 05/20/23 2321    Comprehensive Metabolic Panel [[id number]] Collected: 05/20/23 2313    Specimen: Blood, Venous Updated: 05/20/23 2321    CBC with Differential (Order) [[id number]] Collected: 05/20/23 2313    Specimen: Blood, Venous Updated: 05/20/23 2321    Narrative:      The following orders were created for panel order CBC with Differential (Order).  Procedure                               Abnormality         Status                     ---------                               -----------         ------                     CBC with Differential (C.Marland KitchenMarland Kitchen[[id number]]                      In process                   Please Wood results for these tests on the individual orders.    CBC with Differential (Component) [[id number]] Collected: 05/20/23 2313    Specimen: Blood, Venous Updated: 05/20/23 2321    PT/INR [[id number]] Collected: 05/20/23 2313    Specimen: Blood, Venous Updated: 05/20/23 2321    APTT [[id number]] Collected: 05/20/23 2313    Specimen: Blood, Venous Updated: 05/20/23 2321            Radiologic Studies -   Radiology Results (24 Hour)       ** No results found for the last 24 hours. **        .    Medical Decision Making   I am the first provider for this  patient.    I reviewed the vital signs, available nursing notes, past medical history, past surgical history, family history and social history.    Vital Signs-Reviewed the patient's vital signs.   Patient Vitals for the past 12 hrs:   BP Temp Pulse Resp   05/20/23 2322 92/51 -- 77 16   05/20/23 2304 (!) 75/48 97.3 F (36.3 C) 90 16       Pulse Oximetry Analysis - {Pulse ox analysis:60388} ***% on ***    Cardiac Monitor:  Rate:   Rhythm:  {Rhythm including paccardio:30870}    EKG:  Interpreted by the EP.   Time Interpreted:    Rate:    Rhythm: {Rhythm including paccardio:30870}   Interpretation:   Comparison: {No prior study available :3010178228}    Old Medical Records: {Records review:60251}     Procedure: ***    ED Course:     Results      Provider Notes:     Assessment & Plan      Critical Care Time:    For Hospitalized Patients:    1. Hospitalization Decision Time:  The decision to admit this patient was made by the emergency provider at ***[time] on 05/20/2023     Clinical Decision Support:         2. Aspirin: {GRAFASPIRIN:39611}  3. Core Measures: ***      Diagnosis     Clinical Impression: No diagnosis found.    Treatment Plan:   ED Disposition       None              _______________________________      Dr. Fonnie Mu, MD, is the primary provider for this patient.   _______________________________       [1]   Current Facility-Administered Medications   Medication Dose Route Frequency Provider Last Rate Last Admin    sodium chloride 0.9 % bolus 1,000 mL  1,000 mL Intravenous Once Debbora Lacrosse, MD 999 mL/hr at 05/20/23 2313 1,000 mL at 05/20/23 2313     Current Outpatient Medications   Medication Sig Dispense Refill    apixaban (ELIQUIS) 5 MG Take 1 tablet (5 mg) by mouth every 12 (twelve) hours      aspirin EC 81 MG EC tablet Take 1 tablet (81 mg total) by mouth daily 30 tablet 1    atorvastatin (LIPITOR) 40 MG tablet Take 1 tablet (40 mg total) by mouth daily 90 tablet 3    desonide (DESOWEN)  0.05 % ointment Apply topically 2 (two) times daily Apply bid to scalp/face until reaction improved 60 g 5    mupirocin (BACTROBAN) 2 % ointment Apply bid to face/scalp lesions until healed 22 g 3    niacinamide 500 MG tablet Take 1 tablet (500 mg) by mouth 2 (two) times daily with meals      QUEtiapine (SEROquel) 25 MG tablet Take 1 tablet (25 mg) by mouth nightly 90 tablet 1    tamsulosin (FLOMAX) 0.4 MG Cap Take 1 capsule (0.4 mg) by mouth Daily after dinner      triamcinolone (KENALOG) 0.1 % ointment Apply bid to eczema on lower body until clear, then use PRN 80 g 4   [2]   Past Surgical History:  Procedure Laterality Date    ABDOMINAL SURGERY      Hernia repair   [3]   Family History  Problem Relation Name Age of Onset    Heart disease Father      Stroke Father      Diabetes Maternal Aunt     [4]   Social History  Tobacco Use    Smoking status: Never     Passive exposure: Never    Smokeless tobacco: Never   Vaping Use    Vaping status: Never Used   Substance Use Topics    Alcohol use: Yes     Comment: 3 per week    Drug use: No   [5]   Allergies  Allergen Reactions    Penicillins      Disorientation- per patient, no allergy as of 03/7023

## 2023-05-21 ENCOUNTER — Encounter
Admission: EM | Disposition: A | Discharge status: Hospice | Payer: Self-pay | Source: Home / Self Care | Attending: Internal Medicine

## 2023-05-21 ENCOUNTER — Emergency Department: Payer: Medicare Other

## 2023-05-21 ENCOUNTER — Inpatient Hospital Stay: Payer: Medicare Other

## 2023-05-21 DIAGNOSIS — K5790 Diverticulosis of intestine, part unspecified, without perforation or abscess without bleeding: Secondary | ICD-10-CM

## 2023-05-21 DIAGNOSIS — R578 Other shock: Secondary | ICD-10-CM

## 2023-05-21 DIAGNOSIS — K922 Gastrointestinal hemorrhage, unspecified: Principal | ICD-10-CM | POA: Diagnosis present

## 2023-05-21 HISTORY — PX: EMBOLIZATION ARTERIAL GENERAL: IMG2569

## 2023-05-21 LAB — CBC
Absolute nRBC: 0 10*3/uL (ref <=0.00)
Absolute nRBC: 0 10*3/uL (ref <=0.00)
Absolute nRBC: 0 10*3/uL (ref <=0.00)
Absolute nRBC: 0 10*3/uL (ref <=0.00)
Absolute nRBC: 0 10*3/uL (ref <=0.00)
Hematocrit: 27.3 % — ABNORMAL LOW (ref 37.6–49.6)
Hematocrit: 28.5 % — ABNORMAL LOW (ref 37.6–49.6)
Hematocrit: 28.9 % — ABNORMAL LOW (ref 37.6–49.6)
Hematocrit: 27 % — ABNORMAL LOW (ref 37.6–49.6)
Hematocrit: 27.3 % — ABNORMAL LOW (ref 37.6–49.6)
Hemoglobin: 9.2 g/dL — ABNORMAL LOW (ref 12.5–17.1)
Hemoglobin: 9.7 g/dL — ABNORMAL LOW (ref 12.5–17.1)
Hemoglobin: 9.6 g/dL — ABNORMAL LOW (ref 12.5–17.1)
Hemoglobin: 9.1 g/dL — ABNORMAL LOW (ref 12.5–17.1)
Hemoglobin: 9.3 g/dL — ABNORMAL LOW (ref 12.5–17.1)
MCH: 31.5 pg (ref 25.1–33.5)
MCH: 31.3 pg (ref 25.1–33.5)
MCH: 30.4 pg (ref 25.1–33.5)
MCH: 30.5 pg (ref 25.1–33.5)
MCH: 31.2 pg (ref 25.1–33.5)
MCHC: 33.7 g/dL (ref 31.5–35.8)
MCHC: 34 g/dL (ref 31.5–35.8)
MCHC: 33.2 g/dL (ref 31.5–35.8)
MCHC: 33.7 g/dL (ref 31.5–35.8)
MCHC: 34.1 g/dL (ref 31.5–35.8)
MCV: 93.5 fL (ref 78.0–96.0)
MCV: 91.9 fL (ref 78.0–96.0)
MCV: 91.5 fL (ref 78.0–96.0)
MCV: 90.6 fL (ref 78.0–96.0)
MCV: 91.6 fL (ref 78.0–96.0)
MPV: 9.1 fL (ref 8.9–12.5)
MPV: 8.8 fL — ABNORMAL LOW (ref 8.9–12.5)
MPV: 8.9 fL (ref 8.9–12.5)
MPV: 8.6 fL — ABNORMAL LOW (ref 8.9–12.5)
MPV: 8.5 fL — ABNORMAL LOW (ref 8.9–12.5)
Platelet Count: 84 10*3/uL — ABNORMAL LOW (ref 142–346)
Platelet Count: 75 10*3/uL — ABNORMAL LOW (ref 142–346)
Platelet Count: 69 10*3/uL — ABNORMAL LOW (ref 142–346)
Platelet Count: 73 10*3/uL — ABNORMAL LOW (ref 142–346)
Platelet Count: 70 10*3/uL — ABNORMAL LOW (ref 142–346)
RBC: 2.92 10*6/uL — ABNORMAL LOW (ref 4.20–5.90)
RBC: 3.1 10*6/uL — ABNORMAL LOW (ref 4.20–5.90)
RBC: 3.16 10*6/uL — ABNORMAL LOW (ref 4.20–5.90)
RBC: 2.98 10*6/uL — ABNORMAL LOW (ref 4.20–5.90)
RBC: 2.98 10*6/uL — ABNORMAL LOW (ref 4.20–5.90)
RDW: 14 % (ref 11–15)
RDW: 14 % (ref 11–15)
RDW: 14 % (ref 11–15)
RDW: 14 % (ref 11–15)
RDW: 14 % (ref 11–15)
WBC: 10.89 10*3/uL — ABNORMAL HIGH (ref 3.10–9.50)
WBC: 10.93 10*3/uL — ABNORMAL HIGH (ref 3.10–9.50)
WBC: 12.75 10*3/uL — ABNORMAL HIGH (ref 3.10–9.50)
WBC: 13.12 10*3/uL — ABNORMAL HIGH (ref 3.10–9.50)
WBC: 15.39 10*3/uL — ABNORMAL HIGH (ref 3.10–9.50)
nRBC %: 0 /100{WBCs} (ref <=0.0)
nRBC %: 0 /100{WBCs} (ref <=0.0)
nRBC %: 0 /100{WBCs} (ref <=0.0)
nRBC %: 0 /100{WBCs} (ref <=0.0)
nRBC %: 0 /100{WBCs} (ref <=0.0)

## 2023-05-21 LAB — PT AND APTT
INR: 1.6 (ref 0.9–1.1)
INR: 1.5 (ref 0.9–1.1)
INR: 1.3 (ref 0.9–1.1)
INR: 1.3 (ref 0.9–1.1)
PT: 17.8 s — ABNORMAL HIGH (ref 10.1–12.9)
PT: 16.6 s — ABNORMAL HIGH (ref 10.1–12.9)
PT: 14.7 s — ABNORMAL HIGH (ref 10.1–12.9)
PT: 15.1 s — ABNORMAL HIGH (ref 10.1–12.9)
PTT: 30 s (ref 27–39)
PTT: 28 s (ref 27–39)
PTT: 29 s (ref 27–39)
PTT: 28 s (ref 27–39)

## 2023-05-21 LAB — URINALYSIS WITH MICROSCOPIC EXAM
Urine Bilirubin: NEGATIVE
Urine Glucose: NEGATIVE
Urine Ketones: NEGATIVE mg/dL
Urine Nitrite: NEGATIVE
Urine Specific Gravity: 1.05 — ABNORMAL HIGH (ref 1.001–1.035)
Urine Urobilinogen: NORMAL mg/dL (ref 0.2–2.0)
Urine pH: 6 (ref 5.0–8.0)

## 2023-05-21 LAB — ARTERIAL BLOOD GAS
Arterial Base Excess: -4.4 meq/L — ABNORMAL LOW (ref <=2.7)
Arterial CO2: 21.7 meq/L — ABNORMAL LOW (ref 23.0–30.0)
Arterial HCO3: 22.5 meq/L — ABNORMAL LOW (ref 23.0–28.0)
Arterial O2 Saturation: 77.2 % — ABNORMAL LOW (ref 94.0–98.0)
Arterial pCO2: 51.8 mm[Hg] — ABNORMAL HIGH (ref 32.0–48.0)
Arterial pH: 7.257 — ABNORMAL LOW (ref 7.350–7.450)
Arterial pO2: 46.4 mm[Hg] — ABNORMAL LOW (ref 83.0–108.0)
Temperature: 36.3 C

## 2023-05-21 LAB — URINALYSIS WITH REFLEX TO MICROSCOPIC EXAM - REFLEX TO CULTURE
Urine Bilirubin: NEGATIVE
Urine Glucose: NEGATIVE
Urine Ketones: NEGATIVE mg/dL
Urine Nitrite: NEGATIVE
Urine Specific Gravity: 1.044 — ABNORMAL HIGH (ref 1.001–1.035)
Urine Urobilinogen: NORMAL mg/dL (ref 0.2–2.0)
Urine pH: 6 (ref 5.0–8.0)

## 2023-05-21 LAB — ECG 12-LEAD
Atrial Rate: 88 {beats}/min
IHS MUSE NARRATIVE AND IMPRESSION: NORMAL
P Axis: 75 degrees
P-R Interval: 156 ms
Q-T Interval: 372 ms
QRS Duration: 118 ms
QTC Calculation (Bezet): 450 ms
R Axis: -88 degrees
T Axis: 50 degrees
Ventricular Rate: 88 {beats}/min

## 2023-05-21 LAB — HEMOGLOBIN AND HEMATOCRIT
Hematocrit: 24 % — ABNORMAL LOW (ref 37.6–49.6)
Hemoglobin: 7.7 g/dL — ABNORMAL LOW (ref 12.5–17.1)

## 2023-05-21 LAB — BASIC METABOLIC PANEL
Anion Gap: 5 (ref 5.0–15.0)
BUN: 21 mg/dL (ref 9–28)
CO2: 18 meq/L (ref 17–29)
Calcium: 7.3 mg/dL — ABNORMAL LOW (ref 7.9–10.2)
Chloride: 114 meq/L — ABNORMAL HIGH (ref 99–111)
Creatinine: 1.3 mg/dL (ref 0.5–1.5)
GFR: 51.9 mL/min/{1.73_m2} — ABNORMAL LOW (ref >=60.0)
Glucose: 179 mg/dL — ABNORMAL HIGH (ref 70–100)
Potassium: 4.4 meq/L (ref 3.5–5.3)
Sodium: 137 meq/L (ref 135–145)

## 2023-05-21 LAB — FIBRINOGEN
Fibrinogen: 218 mg/dL (ref 181–413)
Fibrinogen: 254 mg/dL (ref 181–413)
Fibrinogen: 287 mg/dL (ref 181–413)
Fibrinogen: 283 mg/dL (ref 181–413)

## 2023-05-21 LAB — WHOLE BLOOD GLUCOSE POCT: Whole Blood Glucose POCT: 129 mg/dL — ABNORMAL HIGH (ref 70–100)

## 2023-05-21 LAB — CALCIUM, IONIZED: Calcium, Ionized: 2.22 meq/L — ABNORMAL LOW (ref 2.23–2.56)

## 2023-05-21 LAB — LAB USE ONLY - URINE GRAY CULTURE HOLD TUBE

## 2023-05-21 LAB — LACTIC ACID
Whole Blood Lactic Acid: 1.1 mmol/L (ref 0.2–2.0)
Whole Blood Lactic Acid: 1.2 mmol/L (ref 0.2–2.0)
Whole Blood Lactic Acid: 1.2 mmol/L (ref 0.2–2.0)
Whole Blood Lactic Acid: 2.7 mmol/L — ABNORMAL HIGH (ref 0.2–2.0)

## 2023-05-21 LAB — TYPE AND SCREEN
ABO Rh: B POS
Antibody Screen: NEGATIVE

## 2023-05-21 LAB — BLOOD TYPE CONFIRMATION: Blood Type Confirmation: B POS

## 2023-05-21 SURGERY — EMBOLIZATION ARTERIAL GENERAL
Anesthesia: IV Sedation

## 2023-05-21 MED ORDER — ATORVASTATIN CALCIUM 40 MG PO TABS
40.0000 mg | ORAL_TABLET | Freq: Every day | ORAL | Status: DC
Start: 2023-05-21 — End: 2023-05-28
  Administered 2023-05-22 – 2023-05-28 (×7): 40 mg via ORAL
  Filled 2023-05-21 (×7): qty 1

## 2023-05-21 MED ORDER — IOHEXOL 350 MG/ML IV SOLN
100.0000 mL | Freq: Once | INTRAVENOUS | Status: AC | PRN
Start: 2023-05-21 — End: 2023-05-21
  Administered 2023-05-21: 100 mL via INTRAVENOUS

## 2023-05-21 MED ORDER — LIDOCAINE HCL 1 % IJ SOLN
INTRAMUSCULAR | Status: AC
Start: 2023-05-21 — End: ?
  Filled 2023-05-21: qty 10

## 2023-05-21 MED ORDER — LIDOCAINE 5 % EX PTCH
1.0000 | MEDICATED_PATCH | CUTANEOUS | Status: DC
Start: 2023-05-21 — End: 2023-05-28
  Administered 2023-05-26 – 2023-05-27 (×2): 1 via TRANSDERMAL
  Filled 2023-05-21 (×3): qty 1

## 2023-05-21 MED ORDER — LACTATED RINGERS IV BOLUS
1000.0000 mL | Freq: Once | INTRAVENOUS | Status: AC
Start: 2023-05-21 — End: 2023-05-21
  Administered 2023-05-21: 1000 mL via INTRAVENOUS

## 2023-05-21 MED ORDER — PROTHROMBIN COMPLEX CONC HUMAN 1000 UNITS IV KIT
2144.0000 [IU] | PACK | Freq: Once | INTRAVENOUS | Status: AC
Start: 2023-05-21 — End: 2023-05-21
  Administered 2023-05-21: 2144 [IU] via INTRAVENOUS
  Filled 2023-05-21: qty 2144

## 2023-05-21 MED ORDER — FENTANYL CITRATE (PF) 50 MCG/ML IJ SOLN (WRAP)
25.0000 ug | INTRAMUSCULAR | Status: DC | PRN
Start: 2023-05-21 — End: 2023-05-21
  Administered 2023-05-21 (×2): 50 ug via INTRAVENOUS
  Administered 2023-05-21: 25 ug via INTRAVENOUS
  Administered 2023-05-21: 50 ug via INTRAVENOUS
  Administered 2023-05-21: 25 ug via INTRAVENOUS

## 2023-05-21 MED ORDER — SODIUM CHLORIDE 0.9 % IV SOLN
50.0000 mL | Freq: Once | INTRAVENOUS | Status: AC
Start: 2023-05-21 — End: 2023-05-21
  Administered 2023-05-21: 50 mL via INTRAVENOUS

## 2023-05-21 MED ORDER — TAMSULOSIN HCL 0.4 MG PO CAPS
0.4000 mg | ORAL_CAPSULE | Freq: Every day | ORAL | Status: DC
Start: 2023-05-21 — End: 2023-05-28
  Administered 2023-05-24 – 2023-05-27 (×4): 0.4 mg via ORAL
  Filled 2023-05-21 (×4): qty 1

## 2023-05-21 MED ORDER — LIDOCAINE HCL 1 % IJ SOLN
INTRAMUSCULAR | Status: AC | PRN
Start: 2023-05-21 — End: 2023-05-21
  Administered 2023-05-21: 10 mL via SUBCUTANEOUS

## 2023-05-21 MED ORDER — MIDAZOLAM HCL 1 MG/ML IJ SOLN (WRAP)
INTRAMUSCULAR | Status: AC
Start: 2023-05-21 — End: ?
  Filled 2023-05-21: qty 4

## 2023-05-21 MED ORDER — GLUCAGON 1 MG IJ SOLR (WRAP)
1.0000 mg | INTRAMUSCULAR | Status: DC | PRN
Start: 2023-05-21 — End: 2023-05-28

## 2023-05-21 MED ORDER — NOREPINEPHRINE-DEXTROSE 8-5 MG/250ML-% IV SOLN (IHS)
0.0000 ug/min | INTRAVENOUS | Status: DC
Start: 2023-05-21 — End: 2023-05-22
  Administered 2023-05-21: 5 ug/min via INTRAVENOUS
  Filled 2023-05-21: qty 250

## 2023-05-21 MED ORDER — HYDROMORPHONE HCL 1 MG/ML IJ SOLN
0.2000 mg | INTRAMUSCULAR | Status: DC | PRN
Start: 2023-05-21 — End: 2023-05-23
  Administered 2023-05-21: 0.2 mg via INTRAVENOUS
  Filled 2023-05-21: qty 1

## 2023-05-21 MED ORDER — DIPHENHYDRAMINE HCL 50 MG/ML IJ SOLN
INTRAMUSCULAR | Status: AC
Start: 2023-05-21 — End: ?
  Filled 2023-05-21: qty 1

## 2023-05-21 MED ORDER — FENTANYL CITRATE (PF) 50 MCG/ML IJ SOLN (WRAP)
INTRAMUSCULAR | Status: AC
Start: 2023-05-21 — End: ?
  Filled 2023-05-21: qty 4

## 2023-05-21 MED ORDER — GLUCOSE 40 % PO GEL (WRAP)
15.0000 g | ORAL | Status: DC | PRN
Start: 2023-05-21 — End: 2023-05-28

## 2023-05-21 MED ORDER — METRONIDAZOLE IN NACL 500 MG/100 ML IV SOLN (WRAP)
500.0000 mg | Freq: Three times a day (TID) | INTRAVENOUS | Status: DC
Start: 2023-05-21 — End: 2023-05-23
  Administered 2023-05-21 – 2023-05-23 (×6): 500 mg via INTRAVENOUS
  Filled 2023-05-21 (×6): qty 100

## 2023-05-21 MED ORDER — DEXMEDETOMIDINE HCL IN NACL 400 MCG/100ML IV SOLN
0.0000 ug/kg/h | INTRAVENOUS | Status: DC
Start: 2023-05-21 — End: 2023-05-21
  Administered 2023-05-21: 0.2 ug/kg/h via INTRAVENOUS
  Filled 2023-05-21: qty 100

## 2023-05-21 MED ORDER — OLANZAPINE 5 MG PO TABS
2.5000 mg | ORAL_TABLET | Freq: Two times a day (BID) | ORAL | Status: DC
Start: 2023-05-21 — End: 2023-05-24
  Administered 2023-05-21 – 2023-05-24 (×6): 2.5 mg via ORAL
  Filled 2023-05-21 (×8): qty 1

## 2023-05-21 MED ORDER — STERILE WATER FOR INJECTION IJ/IV SOLN (WRAP)
1.0000 g | INTRAMUSCULAR | Status: DC
Start: 2023-05-21 — End: 2023-05-23
  Administered 2023-05-21 – 2023-05-23 (×3): 1 g via INTRAVENOUS
  Filled 2023-05-21 (×3): qty 1000

## 2023-05-21 MED ORDER — SODIUM CHLORIDE 0.9 % IV BOLUS
1000.0000 mL | Freq: Once | INTRAVENOUS | Status: AC
Start: 2023-05-21 — End: 2023-05-21
  Administered 2023-05-21: 1000 mL via INTRAVENOUS

## 2023-05-21 MED ORDER — DEXTROSE 50 % IV SOLN
12.5000 g | INTRAVENOUS | Status: DC | PRN
Start: 2023-05-21 — End: 2023-05-28

## 2023-05-21 MED ORDER — HALOPERIDOL LACTATE 5 MG/ML IJ SOLN
5.0000 mg | Freq: Once | INTRAMUSCULAR | Status: AC
Start: 2023-05-21 — End: 2023-05-21
  Administered 2023-05-21: 5 mg via INTRAVENOUS
  Filled 2023-05-21: qty 1

## 2023-05-21 MED ORDER — TRANEXAMIC ACID-NACL 1000-0.7 MG/100ML-% IV SOLN
1000.0000 mg | INTRAVENOUS | Status: DC
Start: 2023-05-21 — End: 2023-05-21

## 2023-05-21 MED ORDER — CALCIUM GLUCONATE-NACL 1-0.675 GM/50ML-% IV SOLN
1.0000 g | INTRAVENOUS | Status: AC
Start: 2023-05-21 — End: 2023-05-21
  Administered 2023-05-21: 1 g via INTRAVENOUS
  Filled 2023-05-21: qty 50

## 2023-05-21 MED ORDER — TRANEXAMIC ACID-NACL 1000-0.7 MG/100ML-% IV SOLN
1000.0000 mg | Freq: Once | INTRAVENOUS | Status: AC
Start: 2023-05-21 — End: 2023-05-21
  Administered 2023-05-21: 1000 mg via INTRAVENOUS
  Filled 2023-05-21: qty 100

## 2023-05-21 MED ORDER — ACETAMINOPHEN 325 MG PO TABS
650.0000 mg | ORAL_TABLET | Freq: Four times a day (QID) | ORAL | Status: DC | PRN
Start: 2023-05-21 — End: 2023-05-28
  Administered 2023-05-22 – 2023-05-25 (×2): 650 mg via ORAL
  Filled 2023-05-21 (×2): qty 2

## 2023-05-21 MED ORDER — DEXMEDETOMIDINE HCL IN NACL 400 MCG/100ML IV SOLN
0.0000 ug/kg/h | INTRAVENOUS | Status: DC
Start: 2023-05-21 — End: 2023-05-22
  Administered 2023-05-21: 1 ug/kg/h via INTRAVENOUS
  Administered 2023-05-21: 0.2 ug/kg/h via INTRAVENOUS
  Administered 2023-05-22: 0.6 ug/kg/h via INTRAVENOUS
  Filled 2023-05-21 (×3): qty 100

## 2023-05-21 MED ORDER — DIPHENHYDRAMINE HCL 50 MG/ML IJ SOLN
25.0000 mg | Freq: Once | INTRAMUSCULAR | Status: AC | PRN
Start: 2023-05-21 — End: 2023-05-21
  Administered 2023-05-21: 25 mg via INTRAVENOUS

## 2023-05-21 MED ORDER — HALOPERIDOL LACTATE 5 MG/ML IJ SOLN
2.5000 mg | INTRAMUSCULAR | Status: DC | PRN
Start: 2023-05-21 — End: 2023-05-25
  Administered 2023-05-21 – 2023-05-22 (×7): 2.5 mg via INTRAVENOUS
  Filled 2023-05-21 (×7): qty 1

## 2023-05-21 MED ORDER — ZIPRASIDONE MESYLATE 20 MG IM SOLR
10.0000 mg | Freq: Once | INTRAMUSCULAR | Status: AC
Start: 2023-05-21 — End: 2023-05-21
  Administered 2023-05-21: 10 mg via INTRAMUSCULAR
  Filled 2023-05-21: qty 20

## 2023-05-21 MED ORDER — QUETIAPINE FUMARATE 25 MG PO TABS
25.0000 mg | ORAL_TABLET | Freq: Two times a day (BID) | ORAL | Status: DC
Start: 2023-05-21 — End: 2023-05-22
  Administered 2023-05-21: 25 mg via ORAL
  Filled 2023-05-21: qty 1

## 2023-05-21 MED ORDER — FLUMAZENIL 0.5 MG/5ML IV SOLN
0.2000 mg | INTRAVENOUS | Status: DC | PRN
Start: 2023-05-21 — End: 2023-05-21

## 2023-05-21 MED ORDER — ONDANSETRON HCL 4 MG/2ML IJ SOLN
4.0000 mg | Freq: Once | INTRAMUSCULAR | Status: DC
Start: 2023-05-21 — End: 2023-05-23

## 2023-05-21 MED ORDER — MIDAZOLAM HCL 1 MG/ML IJ SOLN (WRAP)
0.5000 mg | INTRAMUSCULAR | Status: DC | PRN
Start: 2023-05-21 — End: 2023-05-21
  Administered 2023-05-21 (×3): 0.5 mg via INTRAVENOUS
  Administered 2023-05-21: 1 mg via INTRAVENOUS

## 2023-05-21 MED ORDER — NALOXONE HCL 0.4 MG/ML IJ SOLN (WRAP)
0.1000 mg | INTRAMUSCULAR | Status: AC | PRN
Start: 2023-05-21 — End: 2023-05-21

## 2023-05-21 MED ORDER — ACETAMINOPHEN 10 MG/ML IV SOLN
1000.0000 mg | Freq: Once | INTRAVENOUS | Status: AC
Start: 2023-05-21 — End: 2023-05-21
  Administered 2023-05-21: 1000 mg via INTRAVENOUS
  Filled 2023-05-21: qty 100

## 2023-05-21 MED ORDER — QUETIAPINE FUMARATE 25 MG PO TABS
25.0000 mg | ORAL_TABLET | Freq: Every evening | ORAL | Status: DC | PRN
Start: 2023-05-21 — End: 2023-05-23
  Administered 2023-05-22: 25 mg via ORAL
  Filled 2023-05-21: qty 1

## 2023-05-21 MED ORDER — DEXTROSE 10 % IV BOLUS
12.5000 g | INTRAVENOUS | Status: DC | PRN
Start: 2023-05-21 — End: 2023-05-28

## 2023-05-21 MED ORDER — SENNOSIDES-DOCUSATE SODIUM 8.6-50 MG PO TABS
1.0000 | ORAL_TABLET | Freq: Two times a day (BID) | ORAL | Status: AC
Start: 2023-05-21 — End: ?
  Administered 2023-05-21 – 2023-05-27 (×7): 1 via ORAL
  Filled 2023-05-21 (×9): qty 1

## 2023-05-21 SURGICAL SUPPLY — 26 items
CATHETER EMBL L130 CM MICRO SELECTIVE (Catheter) ×2 IMPLANT
CATHETER EMBOLIZATION L130 CM MICRO SELECTIVE STRAIGHT TIP OD2-2.8 FR (Catheter) ×2 IMPLANT
CATHETER OD5 FR L65 CM BEACON ANGIOGRAPHIC ACCEPTS .038 IN GUIDEWIRE (Catheter Miscellaneous) ×1 IMPLANT
CATHETER OD5 FR L65 CM C2 CURVE BEACON (Catheter Micellaneous) ×1 IMPLANT
CATHETER OD5 FR L65 CM RADIOPAQUE HIGH (Catheter Micellaneous) ×1 IMPLANT
CATHETER OD5 FR L65 CM RADIOPAQUE HIGH TORQUE SHAFT ANGLE OMNI SOFT-VU (Catheter Miscellaneous) ×1 IMPLANT
CATHETER OD5 FR L80 CM 0 HOLE TAPERED TIP BRAIDED .038 IN SOFT-VU (Catheter Miscellaneous) ×1 IMPLANT
CATHETER OD5 FR L80 CM RADIOPAQUE (Catheter Micellaneous) ×1 IMPLANT
COIL L177 CM L8 CM OD4 MM RADIOPAQUE (Coil) ×1 IMPLANT
COIL L177 CM L8 CM OD4 MM RADIOPAQUE DETACHABLE KINKLESS WEDGE LOCK (Coil) ×1 IMPLANT
DEVICE ANGIO-SEAL VIP CLOSURE HEMOSTATIC BIOABSORBABLE INSERTION (Sealant) ×1 IMPLANT
DEVICE CLOSURE L70 CM ANGIO-SEAL VIP (Sealant) ×1 IMPLANT
DEVICE TORQUE .014-.045 IN 1 HAND GUIDEWIRE OLCOTT HYDROPHILIC (Procedure Accessories) ×1 IMPLANT
DEVICE TRQ HDRPH .014-.045IN OLCOTT STRL (Procedure Accessories) ×1 IMPLANT
GUIDEWIRE VASCULAR OD.014 IN L215 CM L45 (Guidewire) ×1 IMPLANT
GUIDEWIRE VASCULAR OD.014 IN L215 CM L45 CM SYNCHRO SELECT STANDARD (Guidewire) ×1 IMPLANT
GUIDEWIRE VASCULAR OD.035 IN L145 CM L15 (Guidewire) ×1 IMPLANT
GUIDEWIRE VASCULAR OD.035 IN L145 CM L15 CM BENTSON BENTSON TAPER (Guidewire) ×1 IMPLANT
GUIDEWIRE VASCULAR OD.41 MM L200 CM (Guidewire) ×1 IMPLANT
GUIDEWIRE VASCULAR OD.41 MM L200 CM HEADLINER 45 D HYDROPHILIC (Guidewire) ×1 IMPLANT
INTRODUCER SHEATH OD6 FR L11 CM (Sheaths) ×1 IMPLANT
INTRODUCER SHEATH OD6 FR L11 CM RADIOPAQUE HEMOSTASIS VALVE BRITE TIP (Sheaths) ×1 IMPLANT
KIT MICROINTRODUCER L7 CM MAX COAXIAL (Introducer) ×1 IMPLANT
KIT MICROINTRODUCER L7 CM MAX COAXIAL GUIDEWIRE ECHOGENIC NEEDLE OD5 (Introducer) ×1 IMPLANT
MICROCATHETER INFUSION L105 CM KIT (Catheter) ×1 IMPLANT
MICROCATHETER INFUSION L105 CM KIT RADIOPAQUE BRAID OD3-2.8 FR ID.027 (Catheter) ×1 IMPLANT

## 2023-05-21 NOTE — Plan of Care (Addendum)
0500H brought from cathlab;   MTP activated, to be given without [place] Rapid Infuser as per NP, no central line indicated.   CNS: confused, agitated and combative, patient hitting and punching even though he's drowsy upon arrival.   Non-Violent restraints initiated for both wrists and Rt ankle to prevent patient from interference with medical treatment.   Alert oriented x1. Precedex at maximum dose.   Haldol PRN given, Precedex drip weaned.   Restraints.   CVS: SR with HR of 60-70s, Levo drip started for hypotension.   Right femoral access site clean and intact, no bleeding. Femstop application verbally ordered by [place]. Vpad to be removed after 12hrs at 1610H. With Rt limb with immobilization brace.   Resp: nasal cannula 4Lpm.   GU: foley from cathlab. Samples sent.   Skin: multiple dry wounds throughout the body, patient uncooperative, RN unable to take pictures.   Wife at bedside, poor historian and also needs repetitive reorientation. Lily spoke to daughter [name] multiple times as well.   1u PRBC and 2 FFP from MTP cooler given.         Problem: Non-Violent Restraints Interdisciplinary Plan  Goal: Will be injury free during the use of non-violent restraints  Outcome: Progressing  Flowsheets (Taken 05/21/2023 0627)  Will be injury free during the use of non-violent restraints:   Attempt all alternatives before use of restraints   Initiate least restrictive type of restraint that is effective   Provide and maintain safe environment   Include patient/family/caregiver in decisions related to safety   Notify family of initiation of restraints   Ensure safety devices are properly applied and maintained   Document observed patient actions according to protocol   Nurse to accompany patient off unit when on restraints   Remove restraints before the indicated maximum length of time when meets criteria for discontinuation   Document significant changes in patient condition   Provide debriefing as soon as possible and  appropriate   Reassess need for continued restraints   Ensure that order for restraints has not expired     Problem: Altered GI Function  Goal: Fluid and electrolyte balance are achieved/maintained  Outcome: Progressing  Flowsheets (Taken 05/21/2023 0627)  Fluid and electrolyte balance are achieved/maintained:   Monitor/assess lab values and report abnormal values   Assess and reassess fluid and electrolyte status   Observe for cardiac arrhythmias   Monitor for muscle weakness  Goal: Elimination patterns are normal or improving  Outcome: Progressing  Flowsheets (Taken 05/21/2023 0627)  Elimination patterns are normal or improving: (no rectal bleed or stool) --     Problem: Moderate/High Fall Risk Score >5  Goal: Patient will remain free of falls  Outcome: Progressing  Flowsheets (Taken 05/21/2023 0627)  VH High Risk (Greater than 13):   ALL REQUIRED LOW INTERVENTIONS   ALL REQUIRED MODERATE INTERVENTIONS   RED "HIGH FALL RISK" SIGNAGE   BED ALARM WILL BE ACTIVATED WHEN THE PATEINT IS IN BED WITH SIGNAGE "RESET BED ALARM"   PATIENT IS TO BE SUPERVISED FOR ALL TOILETING ACTIVITIES   A safety companion may be used when deemed appropriate by the Primary RN and Clinical Administrator   Keep door open for better visibility   Include family/significant other in multidisciplinary discussion regarding plan of care as appropriate   Use assistive devices   Use of floor mat     Problem: Safety  Goal: Patient will be free from injury during hospitalization  Outcome: Progressing  Flowsheets (Taken 05/21/2023 0627)  Patient will be free from injury during hospitalization:   Assess patient's risk for falls and implement fall prevention plan of care per policy   Provide and maintain safe environment   Use appropriate transfer methods   Ensure appropriate safety devices are available at the bedside   Include patient/ family/ care giver in decisions related to safety  Goal: Patient will be free from infection during  hospitalization  Outcome: Progressing  Flowsheets (Taken 05/21/2023 0627)  Free from Infection during hospitalization:   Assess and monitor for signs and symptoms of infection   Monitor lab/diagnostic results   Monitor all insertion sites (i.e. indwelling lines, tubes, urinary catheters, and drains)   Encourage patient and family to use good hand hygiene technique     Problem: Pain  Goal: Pain at adequate level as identified by patient  Outcome: Progressing  Flowsheets (Taken 05/21/2023 0627)  Pain at adequate level as identified by patient:   Identify patient comfort function goal   Assess for risk of opioid induced respiratory depression, including snoring/sleep apnea. Alert healthcare team of risk factors identified.   Reassess pain within 30-60 minutes of any procedure/intervention, per Pain Assessment, Intervention, Reassessment (AIR) Cycle   Assess pain on admission, during daily assessment and/or before any "as needed" intervention(s)   Evaluate if patient comfort function goal is met   Evaluate patient's satisfaction with pain management progress   Offer non-pharmacological pain management interventions     Problem: Inadequate Gas Exchange  Goal: Adequate oxygenation and improved ventilation  Outcome: Progressing  Flowsheets (Taken 05/21/2023 1610)  Adequate oxygenation and improved ventilation:   Assess lung sounds   Monitor SpO2 and treat as needed   Provide mechanical and oxygen support to facilitate gas exchange   Teach/reinforce use of incentive spirometer 10 times per hour while awake, cough and deep breath as needed   Plan activities to conserve energy: plan rest periods   Increase activity as tolerated/progressive mobility   Consult/collaborate with Respiratory Therapy

## 2023-05-21 NOTE — Progress Notes (Signed)
05/21/23 1300   Volunteer Chaplain   Visit Type Initial   Source Chaplain Initiated   Present at Visit Patient;Family members   Spiritual Care Provided to family members   Reason for Visit Spiritual Support   Spiritual Care Interventions Provided Chaplaincy Education;Provided comfort, encouragement,affirmation   Spiritual Care Outcomes Made a positive connection with family/caregiver   Length of Visit 0-15 minutes   Follow-up No follow-up at this time

## 2023-05-21 NOTE — Plan of Care (Signed)
Problem: Moderate/High Fall Risk Score >5  Goal: Patient will remain free of falls  Outcome: Not Progressing  Flowsheets (Taken 05/21/2023 0800)  High (Greater than 13):   HIGH-Visual cue at entrance to patient's room   HIGH-Bed alarm on at all times while patient in bed   HIGH-Consider use of low bed   HIGH-Initiate use of floor mats as appropriate     Problem: Inadequate Gas Exchange  Goal: Adequate oxygenation and improved ventilation  Outcome: Progressing  Flowsheets (Taken 05/21/2023 8099 by Claiborne Rigg, RN)  Adequate oxygenation and improved ventilation:   Assess lung sounds   Monitor SpO2 and treat as needed   Provide mechanical and oxygen support to facilitate gas exchange   Teach/reinforce use of incentive spirometer 10 times per hour while awake, cough and deep breath as needed   Plan activities to conserve energy: plan rest periods   Increase activity as tolerated/progressive mobility   Consult/collaborate with Respiratory Therapy     Problem: Altered GI Function  Goal: Elimination patterns are normal or improving  Outcome: Progressing     Problem: Non-Violent Restraints Interdisciplinary Plan  Goal: Will be injury free during the use of non-violent restraints  Outcome: Progressing  Flowsheets (Taken 05/21/2023 8338 by Claiborne Rigg, RN)  Will be injury free during the use of non-violent restraints:   Attempt all alternatives before use of restraints   Initiate least restrictive type of restraint that is effective   Provide and maintain safe environment   Include patient/family/caregiver in decisions related to safety   Notify family of initiation of restraints   Ensure safety devices are properly applied and maintained   Document observed patient actions according to protocol   Nurse to accompany patient off unit when on restraints   Remove restraints before the indicated maximum length of time when meets criteria for discontinuation   Document significant changes in patient condition   Provide  debriefing as soon as possible and appropriate   Reassess need for continued restraints   Ensure that order for restraints has not expired     Problem: Compromised Friction/Shear  Goal: Friction and Shear Interventions  Outcome: Progressing  Flowsheets (Taken 05/21/2023 0800)  Friction and Shear Interventions: Pad bony prominences, Off load heels, HOB 30 degrees or less unless contraindicated, Consider: TAP seated positioning, Heel foams

## 2023-05-21 NOTE — Consults (Signed)
Dillon Lowenstein, MD    Dory Peru, NP    [address] [place], NP      Mon-Sun   [phone]                                                                      Palliative Care Consult   Date Time: 05/21/23 10:01 AM   Patient Name: Dillon Wood, Dillon Wood   Location: U9811/B1478-29   Attending Physician: Leilani Merl, MD   Primary Care Physician: Marisa Sprinkles, MD   Consulting Provider: Dory Peru, NP   Consulting Service: Palliative Medicine  Consulted request from Leilani Merl, MD   Reason for Referral: Clarify goals of care; End of life; Counsel regarding hospice     Palliative Diagnosis Category: Neurology (CVA/Dementia...) and GI  Patient Type: New        Advance Care Planning    05/21/23    Decisional Capacity: no  Advance Directives have been completed in the past: yes  Advance Directives are available in chart: no  Medical Decision Maker: Advance directives: Health Care Agent:  [name] (Daughter):  [phone]  [name] (Daughter):  [phone]  Hewlett Barrueta (son):  [phone]      Meeting Participants   [name] and Research scientist (physical sciences) at bedside, [name] over the phone    Summary of Medical Condition/ Treatment Options/ Prognosis  Acute lower GI bleed secondary to sigmoid diverticulosis  --S/p Gelfoam embolization of the IMA and coil placement in superior rectal artery  Hemorrhagic shock  Dementia  Acute blood loss anemia  Protein energy malnutrition      Patient's or Decision Maker's Perspective, Wishes, and Goals for Treatment  -- Patient had a previously signed POLST document from January 2024.  Daughters [name] and Research scientist (physical sciences) at bedside both report the patient does not have the capacity make his own decisions and was not making his own decisions back in January.  They do not know how these documents were signed, patient should not have signed him in the first place.  -- Family does have medical power of attorney  documents, they will try to provide a copy to the hospital.  Both daughters [name] as well as son [name] all state the patient should be DNR/DNI and that this accurately reflects patient's wishes.  -- Patient is from [facility] facility.  Plan will be to return where he is then going to be moved to memory care.  -- Patient's wife also has dementia and is currently living with patient at the facility.  -- Patient has been mobile mostly with the use of an electric scooter but has been able to get OOB and walk short distances with assistance.  Has had worsening sundowning but patient is also very hard of hearing and this may be contributing.  -- Family is not ready for hospice services, family states that when patient is doing well and at baseline he has good quality of life.  Hope is that this hospital stay is for the acute issue only and can then get him home.  -- Palliative care team remains available for continued ACP/GOC conversations        Outcomes / Next Steps   Outcomes: Clarified goals of care, Provided end of life assistance, Provided advance  care planning, Provided psychosocial or spiritual support, Changed CPR status, and Completed durable DNR      NO CPR - SUPPORT OK      Time in 1240p Time Out 1p Total Time spent on Advance Care Planning with voluntary consent from participant/s. No active management of the problems listed above was undertaken during the time  period reported.      Assessment     Leart Duke Human 89 y.o. 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      AMS  Chronic Back Pain  Progressive Anorexia-Cachexia  Debility  Acute lower GI bleed secondary to sigmoid diverticulosis  --S/p Gelfoam embolization of the IMA and coil placement in superior rectal artery  Hemorrhagic shock  Dementia  Acute  blood loss anemia  Protein energy malnutrition       Plan   Physical symptoms:    #AMS  -- Adequate oxygenation  -- Pain management  -- Correct anemia  -- Delirium precautions  -- Continue Zyprexa 2.5 mg p.o. twice daily  -- Haldol 2.5 mg sublingual every 8 hours as needed moderate agitation/restless  -- Haldol 2.5 mg IV every 4 hours as needed severe agitation/restless  -- Hold Haldol for prolonged Qtc  -- Constipation prophylaxis    #Chronic Back Pain--no pain medication at home  Family requesting caution with opiates  -- Start lidocaine patch to back every 24 hours  -- Tylenol 1 g p.o. every 8 hours ATC  -- Ultram 50 mg p.o. every 6 hours as needed moderate pain  -- Oxycodone 5 mg p.o. every 4 hours as needed severe pain  -- Constipation prophylaxis    #Progressive Anorexia-Cachexia  -- SLP evaluation  -- Dietary/nutrition consult calorie optimization  -- Calorie dense meals  -- Pleasure feeds  -- Artificial nutrition not recommended in setting of progressive dementia    #Progressive Debility  -- Pain management  -- Turn and reposition for comfort  -- PT/OT as medically appropriate     Psychosocial :     Patient supported by his wife, 4 children      Based on my assessment at this time,estimated Prognosis: Weeks to months possibly longer. Prognosis can change over time.     PC Team follow-up plans: tomorrow  Discharge Disposition: TBD  Outpatient Follow Up Recommended: Yes  Discussed with RN, Dr. Minus Liberty        History of Presenting Illness   [name] is a 89 y.o. 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    In the emergency room blood pressure was only 75/48, hemoglobin 9.1, creatinine 1.6, lactic acid 2.3.  While in the emergency room patient had large-volume bright red blood per rectum with significant clot burden,  repeat CBC with hemoglobin 7.7.  Chest x-ray showing patchy airspace opacities, CT head negative for acute process.  CTA abdomen/pelvis with active hemorrhage in the sigmoid colon likely due to diverticulosis.  Patient received 2 L IV fluid, anticoagulant reversal, 2 units PRBC with improvement of blood pressure.  Patient emergently went to IR who noted no active extraversion.  Gelfoam embolization of sigmoid branches of the IMA  were performed, single coil placed in the superior rectal artery as well.  Patient was admitted to the ICU for hypotension postprocedure.    Patient tolerated the procedure for poorly with combativeness during the procedure.  Patient receiving IV fentanyl, IV Versed.  In the ICU patient continued to be agitated and combative, placed in restraints.  Initially was on Precedex which was able to be weaned after as needed Haldol was administered.  Patient needing 3 doses of the past 24 hours of IV Haldol.    Patient lying in bed at time of visit, awake, oriented x 1-2.  Patient slightly restless but is also very hard of hearing.  Patient does report back pain, does have chronic back issues and is not used to lying on his back.  Patient is currently unable to rank or describe pain.  Patient currently being taken off of restraints, leg immobilizer being removed.  No active signs of bleeding, no labored breathing, no tachypnea.         Palliative Functional and Symptom Assessment       Palliative Performance Scale: 40% - Mainly in bed, unable to do any work, extensive disease, mainly assistance with self-care, normal or reduced intake, full LOC, drowsy or confusion    FAST Score (Dementia Patients): 7C - Ambulatory ability is lost (cannot walk without personal assistance).    ECOG (Cancer Patients): N/A         Review of Systems   Review of Systems   Unable to perform ROS: Dementia           Past Medical, Surgical and Family History   Medical History[1]   Past Surgical History[2]   Family History[3]     Social History   Substance Use:    reports that he has never smoked. He has never been exposed to tobacco smoke. He has never used smokeless tobacco.    reports current alcohol use.    reports no history of drug use.     Marital Status: married    Cultural Concerns:    Nurse, learning disability needed: []  NO   []  YES                                       Language: English  ID#_________    Spirituality and Importance: Protestant      Medications   Medication list/MAR reviewed.  Current Facility-Administered Medications   Medication Dose Route Frequency    atorvastatin  40 mg Oral Daily    cefTRIAXone  1 g Intravenous Q24H    ondansetron  4 mg Intravenous Once    QUEtiapine  25 mg Oral Q12H    senna-docusate  1 tablet Oral Q12H SCH     As needed medications include: acetaminophen, dextrose **OR** dextrose **OR** dextrose **OR** glucagon (rDNA), haloperidol lactate     Allergies   Allergies[4]    Physical Exam   BP (!) 73/35   Pulse (!) 57   Temp (!) 96.7 F (35.9 C) (Temporal)   Resp 14   Ht 1.753 m (5\' 9" )   Wt 70.7 kg (155 lb 12.8 oz)   SpO2 100%   BMI 23.01 kg/m    GENERAL: Ill-appearing, cachectic  EYES: Sclera anicteric. Conjunctivae pink.  ENT: Oral mucosa moist.  NECK: Trachea midline. Neck veins flat. No adenopathy.  HEART: RRR. Normal S1, S2. No murmur appreciated.  CHEST: Breath sounds are diminished bilaterally.  Nasal cannula  ABDOMEN: Soft. Non-tender. Non-distended. BS+.  GU: Deferred.  RECTAL: Deferred.  MUSCULOSKELETAL: Lower back tenderness  EXTREMITIES No edema. No clubbing. No cyanosis.  SKIN: No rash or lesion.  NEURO: Awake, oriented x 1-2, intermittent restlessness  PSYCHE: Affect and mood are appropriate.    Labs / Radiology   Lab and diagnostics: reviewed in Epic  Recent Labs   Lab 05/21/23  0905   WBC 10.93*   Hemoglobin 9.7*   Hematocrit 28.5*   Platelet Count 75*       Recent Labs   Lab 05/21/23  0903   PT 16.6*   INR 1.5   PTT 28        Recent Labs   Lab 05/21/23  0455   Sodium 137   Potassium  4.4   Chloride 114*   CO2 18   BUN 21   Creatinine 1.3   GFR 51.9*   Glucose 179*   Calcium 7.3*     Recent Labs   Lab 05/20/23  2313   Bilirubin, Total 0.5   Protein, Total 5.4*   Albumin 2.9*   ALT 14   AST (SGOT) 19          CT Angiogram Abdomen Pelvis    Result Date: 05/21/2023   1.Active hemorrhage within the sigmoid colon likely due to sigmoid diverticulosis. Recommend interventional radiology consultation to guide further management. 2. There is no definite evidence of acute inflammatory process. 3. Incidental note is made of gallstones, left renal calculi, prostate enlargement, and abnormal thickening of the urinary bladder raising concern for hypertrophy. 4. The possibility of bladder neoplasm cannot be excluded. Consider urology consultation to guide further management. 5. Other findings as noted above. 6. Critical findings were discussed with, and read back by, Dr. Rennis Harding at the following time 05/21/2023 1:05 AM. Miguel Dibble, MD 05/21/2023 1:09 AM    XR Chest  AP Portable    Result Date: 05/21/2023  1.Diffuse interstitial prominence may be secondary to mild pulmonary edema. 2.Patchy airspace opacities in the lung bases may represent atelectasis or pneumonia versus underlying interstitial lung disease. Judd Gaudier, MD 05/21/2023 12:54 AM    CT Head without Contrast    Result Date: 05/21/2023   No acute intracranial abnormality. Vernie Murders, MD 05/21/2023 12:29 AM        Signed by: Dory Peru, NP.  [facility]  [phone]             [1]   Past Medical History:  Diagnosis Date    Closed fracture of left side of maxilla 04/03/2017    DVT (deep venous thrombosis)     Facial contusion, initial encounter 04/03/2017    Gastroesophageal reflux disease     Hyperlipemia     Left orbit fracture, closed, initial encounter 04/03/2017    PVD (peripheral vascular disease)    [2]   Past Surgical History:  Procedure Laterality Date    ABDOMINAL SURGERY      Hernia repair   [3]   Family History  Problem  Relation Name Age of Onset    Heart disease Father      Stroke Father      Diabetes Maternal Aunt     [4]   Allergies  Allergen Reactions    Penicillins      Disorientation- per patient, no allergy as of 03/7023

## 2023-05-21 NOTE — Sedation Documentation (Addendum)
[name] Mam Ficco to C- Lab for Visceral Embo with Dr. Excell Seltzer.  Pre-procedure pause completed-all in agreement.     Patient transferred to table in supine position.  64fr. slender arterial sheath to right femoral artery.  Sheath discontinued and AngioSeal deployed @ 0406.  No signs or symptoms of bleeding noted. V-Pad in place, to be removed in 12 hr 11/26 @ 1610.  Bilateral petal pulse dopplerable.  Bedrest for 4 hrs until 0810.      Patient tolerated procedure poorly, pt combative during procedure. ICU MD called at bedside due to hypotension and patient being combative, risking safety.  200 mcg Fentanyl given  2.5 mg midazolam given     Patient transferred to MSICU. Report given to ICU RN.

## 2023-05-21 NOTE — SLP Eval Note (Signed)
[facility]  Speech and Language Therapy Bedside Swallow Evaluation     Patient:  [facility] MRN#:  [id number]  Unit:  MED SURG ICU 1 Room/Bed:  A2915/A2915-01    Time of Treatment:   Time Calculation  SLP Received On: 05/21/23  Start Time: 1330  Stop Time: 1350  Time Calculation (min): 20 min    Consult received for Dillon Wood for SLP Bedside Swallow Evaluation and Treatment.    Medical Diagnosis: Acute blood loss anemia [D62]  GI bleed [K92.2]    History of Present Illness: Dillon Wood is a 89 y.o. 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  No history of GI bleeds in the past.  In ED patient was afebrile, heart rate 90, blood pressure 75/48, satting 96% on room air with no respiratory distress, labs notable for hemoglobin 9.1, platelets 116, creatinine 1.6, lactate 2.3, INR 2.0, PTT 22.  Electrolytes, LFTs, and troponin within normal limits.  While in the ED patient had large-volume BRBPR with significant clot burden, repeat CBC with hemoglobin 7.7.  CXR with patchy airspace opacities, CT head negative for acute intracranial findings.  CTA abdomen pelvis with active hemorrhage in the sigmoid colon likely due to diverticulosis.  In ED patient received 2 L IVF, Kcentra for apixaban reversal, IV TXA, and 2 units PRBC with improvement in blood pressure, lactate normalized. Patient went emergently to IR with Dr. Excell Seltzer, who noted no active extravasation noted. Empiric Gelfoam embolization of sigmoid branches of the IMA performed. Single coil placed in the superior rectal artery as well. Hypotensive in IR suite. MTP activated. Admitted to ICU postprocedure.   Problem List[1]     Past Medical/Surgical History:  Medical History[2]   Past  Surgical History[3]      History/Current Status:  Current Status  Respiratory Status: O2 via nasal cannula  Behavior/Mental Status: Confused, Agitated, Anxious, Uncooperative, Lethargic    Subjective: Patient is agreeable to participation in the therapy session. Nursing clears patient for therapy. Patient's medical condition is appropriate for Speech therapy intervention at this time.    Objective:  Observation of Patient/Vital Signs:  Patient is in bed with telemetry in place.    Oral Motor Skills:  Oral Investment banker, operational Skills: exceptions to [facility]  Oral Motor Impairments: coordination, strength, dysarthria    Deglutition Skills:  Deglutition Skills  Position: upright 90 degrees  Food(s) Tested: Thin (IDDSI TN0), Pureed (IDDSI P4)  Oral Stage: bolus formation/control reduced, slow but effective, bolus formation/control reduced, ineffective  Pharyngeal Stage: suspect delayed response, immediate strong cough    Assessment:   Pt is very confused , unable to follow commands. Irritable. Family present at bedside denying any hx of swallowing difficulty . Pt is with limited cooperation due to poor mentation. Pt tolerated 10 tsp of applesauce and 30 ml of thin liquid with reverse swallow and one episode of delayed coughing, improved with pacing. There was delay in swallow initiation with functional HLE upon subjective digital palpation.   Goals:  Pt will tolerate IDDSI Minced&moist MM5  and Thin TN0 without s/s aspiration or penetration x 48 hours           Plan/Recommendations:     Follow up treatments: diet monitoring, strategies training  Diet Solids Recommendation: Minced and Moist (IDDSI MM5) should be able to advance quickly once mentation improves   Diet Liquids Recommendations: Thin Liquids (IDDSI TN0)  Precautions/Compensations: Awake/alert, Upright 90 degrees for all oral intake, 45 degrees upright after meals, Alternate solids and liquids, Swallow multiple times per bite/sip     SLP Frequency  Recommended: 1-2x/wk  Administration of Medications: crushed in applesauce   D/w RN diet texture recommendation, RN authorized Clinical research associate to place a diet order in Epic          05/21/2023  Presley Raddle MS [facility] SLP   05/21/2023 2:33 PM  [phone]             [1]   Patient Active Problem List  Diagnosis    Traumatic subarachnoid hemorrhage without loss of consciousness, initial encounter    Squamous cell carcinoma of head and neck    Frequent falls    Carotid artery stenosis    Bladder stone    Memory loss    PVD (peripheral vascular disease)    Neuropathy    Acute deep vein thrombosis (DVT) of femoral vein of left lower extremity    Advanced directives, counseling/discussion    Open wound of plantar aspect of foot, left, sequela    Skin lesions    GI bleed   [2]   Past Medical History:  Diagnosis Date    Closed fracture of left side of maxilla 04/03/2017    DVT (deep venous thrombosis)     Facial contusion, initial encounter 04/03/2017    Gastroesophageal reflux disease     Hyperlipemia     Left orbit fracture, closed, initial encounter 04/03/2017    PVD (peripheral vascular disease)    [3]   Past Surgical History:  Procedure Laterality Date    ABDOMINAL SURGERY      Hernia repair

## 2023-05-21 NOTE — ED Notes (Signed)
Blood taken off pump and being pressured bagged per Tildon Husky, MD

## 2023-05-21 NOTE — Brief Op Note (Signed)
BRIEF VIR PROCEDURE NOTE    Date Time: 05/21/23 4:09 AM    Patient Name:     Dillon Wood [facility]    Date of Operation:     05/21/2023    Providers Performing:     Surgeon(s):  Verlee Rossetti, MD    Assistant (s): RT    Operative Procedure:     Procedure(s):  EMBOLIZATION ARTERIAL GENERAL    Preoperative Diagnosis:     Pre-Op Diagnosis Codes:      * GI bleed [K92.2]    Postoperative Diagnosis:     Same as preoperative diagnosis    Anesthesia:     Moderate Sedation and Local with 1% Lidocaine    A safety timeout was performed.   Estimated Blood Loss:     Minimal    Implants:     Implant Name Type Inv. Item Serial No. Manufacturer Lot No. LRB No. Used Action   COIL L177 CM L8 CM OD4 MM RADIOPAQUE - [id number] Coil COIL L177 CM L8 CM OD4 MM RADIOPAQUE  [employer] [id number]  1 Implanted   DEVICE CLOSURE L70 CM ANGIO-SEAL VIP - [id number] Sealant DEVICE CLOSURE L70 CM ANGIO-SEAL VIP  [employer] [id number]  1 Implanted       Specimens:     None    Findings:     No active extravasation noted.  Empiric Gelfoam embolization of sigmoid branches of the IMA performed.  Single coil placed in the superior rectal artery as well.    Complications:     None                                                                    Verlee Rossetti, MD    [facility]  [facility], Division of Vascular & Interventional Radiology  [facility]- [phone]  [facility] - [phone]  [facility]--3570 [facility]--8022 [facility]--3069    AX IVR

## 2023-05-21 NOTE — Treatment Plan (Signed)
4 eyes in 4 hours pressure injury assessment note:      Completed with:   Unit & Time admitted:              Bony Prominences: Check appropriate box; if wound is present enter wound assessment in LDA     Occiput:                 [x] WNL  []  Wound present  Face:                     [] WNL  []  Wound present Dry lips  Ears:                      [x] WNL  []  Wound present  Spine:                    [x] WNL  []  Wound present  Shoulders:             [x] WNL  []  Wound present  Elbows:                  [] WNL  []  Wound present Dry/flaky and with multiple small skin tear at both UE  Sacrum/coccyx:     [] WNL  []  Wound present Blanchable redness  Ischial Tuberosity:  [x] WNL  []  Wound present  Trochanter/Hip:      [] WNL  []  Wound present Dry/flaky and with multiple small skin tear at both LE  Knees:                   [x] WNL  []  Wound present  Ankles:                   [x] WNL  []  Wound present  Heels:                    [x] WNL  []  Wound present  Other pressure areas:  []  Wound location Surgical puncture wound at inguinal area       Device related: []  Device name:         LDA completed if wound present: yes/no  Consult WOCN if necessary    Other skin related issues, ie tears, rash, etc, document in Integumentary flowsheet

## 2023-05-21 NOTE — H&P (Signed)
[facility]  History and Physical      Patient Name: Dillon Wood  MRN: [id number]  Room: A2915/A2915-01  Code Status: Full Code      Assessment & Plan   MSICU Attending Assessment/Plan:    Billing:         Chief Complaint / Primary Reason for MSICU Evaluation     Acute lower GI bleed  Hemorrhagic shock    History of Presenting Illness   Dillon Wood is a 89 y.o. 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  No history of GI bleeds in the past.  In ED patient was afebrile, heart rate 90, blood pressure 75/48, satting 96% on room air with no respiratory distress, labs notable for hemoglobin 9.1, platelets 116, creatinine 1.6, lactate 2.3, INR 2.0, PTT 22.  Electrolytes, LFTs, and troponin within normal limits.  While in the ED patient had large-volume BRBPR with significant clot burden, repeat CBC with hemoglobin 7.7.  CXR with patchy airspace opacities, CT head negative for acute intracranial findings.  CTA abdomen pelvis with active hemorrhage in the sigmoid colon likely due to diverticulosis.  In ED patient received 2 L IVF, Kcentra for apixaban reversal, IV TXA, and 2 units PRBC with improvement in blood pressure, lactate normalized. Patient went emergently to IR with Dr. Excell Seltzer, who noted no active extravasation noted. Empiric Gelfoam embolization of sigmoid branches of the IMA performed. Single coil placed in the superior rectal artery as well. Hypotensive in IR suite. MTP activated. Admitted to ICU postprocedure.     Patient lives in retirement community with his wife.     Subjective   Past Medical History:     Past Medical History:   Diagnosis Date    Closed fracture of left side of maxilla 04/03/2017    DVT (deep venous thrombosis)     Facial contusion, initial  encounter 04/03/2017    Gastroesophageal reflux disease     Hyperlipemia     Left orbit fracture, closed, initial encounter 04/03/2017    PVD (peripheral vascular disease)        Past Surgical History:   Past Surgical History[1]    Family History:   Family History[2]    Social History:     Social History     Socioeconomic History    Marital status: Married     Spouse name: Not on file    Number of children: 4    Years of education: Not on file    Highest education level: Not on file   Occupational History    Occupation: Surveyor, [employer] of [employer]   Tobacco Use    Smoking status: Never     Passive exposure: Never    Smokeless tobacco: Never   Vaping Use    Vaping status: Never Used   Substance and Sexual Activity    Alcohol use: Yes     Comment: 3 per week    Drug use: No    Sexual activity: Not on file   Other Topics Concern    Not on file   Social History Narrative    Not on file     Social Drivers of Health     Financial Resource Strain: Not on file   Food Insecurity: Not on file   Transportation Needs: Not on  file   Physical Activity: Not on file   Stress: Not on file   Social Connections: Not on file   Intimate Partner Violence: Not on file   Housing Stability: Not on file       Allergies:   Allergies[3]       Objective   Physical Examination   Vitals Temp:  [97.3 F (36.3 C)-97.5 F (36.4 C)]   Heart Rate:  [77-117]   Resp Rate:  [8-26]   BP: (64-132)/(43-85)   SpO2:  [93 %-100 %]   Height:  [175.3 cm (5\' 9" )]   Weight:  [70.7 kg (155 lb 12.8 oz)]   BMI (calculated):  [23]  // Temperature with 24 range  Vent Settings      General: Pale, acutely ill-appearing    Neuro:    Spontaneously alert, oriented to self, able to answer some questions, moving all 4 extremities with full strength    Lungs:   Breathing comfortably on room air, no wheezing or rhonchi noted    Cardiac:   Regular rate and rhythm no murmur noted    Abdomen:    Soft and non tender     Extremities:   no pedal edema    Skin:     Cool, slightly  cyanotic     Assessment   [name] is a 89 y.o. male who presents with  acute lower GI bleed and hemorrhagic shock in setting of sigmoid diverticulosis             Recent Labs     05/20/23  2313   Platelet Count 116*     Diagnosis: Mild Thrombocytopenia             Recent Labs   Lab 05/21/23  0054 05/20/23  2313   Hemoglobin 7.7* 9.1*   Hematocrit 24.0* 27.4*   MCV  --  94.8   WBC  --  9.57*   Platelet Count  --  116*         Anemia Diagnosis: Acute: Acute Blood Loss Anemia                  Neuro   History of dementia  Acute metabolic encephalopathy    Cardiovascular   Hemorrhagic shock  PVD status post stent    Renal  Chronic kidney disease    Hematology  Acute blood loss anemia  Lower GI bleed due to sigmoid diverticulosis  History of LLE DVT on apixaban  History of squamous cell carcinoma    ICU Checklist  Sedation:   CAM-ICU:     CAM ICU:   Last Documented RASS: RASS Score: Agitated   Currently ordered infusions:    dexmedeTOMIDine 1.5 mcg/kg/hr (05/21/23 0445)    norepinephrine      Reviewed: Yes   Mobility:   Current Mobility Level:    Current PT Order: No  Current OT Order: No Reviewed: Yes   Respiratory (n/a if blank):   Ventilator Time:    Last Recorded Vent Mode:    Reviewed: Yes   Gastrointenstinal  Last Bowel Movement:   No data recorded Reviewed: Yes   CAUTI Prevention (n/a if blank):  Foley Day: 0       Reviewed: Yes   Blood Steam Infection Prevention (n/a if blank):    Reviewed: Yes   DVT Chemoprophylaxis (none if blank):    Reviewed: Yes            Plan     NEUROLOGICAL:   #  History of dementia  # Acute metabolic encephalopathy, secondary to shock  - Delirium precautions, CAM-ICU monitoring  - Pain control with as needed Tylenol  - PRN IV haldol   - Precedex for procedure, wean off as able for RAS goal 0 to -1   - Continue home nightly Seroquel  - Will plan to order PT/OT when stabilized    CARDIOVASCULAR:   # Hemorrhagic shock  # History of peripheral vascular disease s/p stent  - MAP  goal > 65   - Volume resuscitation 2u PRBC, 2u FFP and 2L IVF   - Remains hypotensive - MTP activated   - Continuous telemetry monitoring   - Hold home aspirin and statin in acute setting    PULMONARY:   - Supplemental oxygen as needed to keep sat > 92%  - CXR with diffuse patchy airspace opacities    RENAL:   # CKD  Creatinine 1.6 from baseline 1.5  - Trend Renal function  - Replace electrolytes as necessary  - Trend lactate Q6H   - Incidental finding of abnormal thickening of the urinary bladder, consider urology consult for further management/evaluation, concern for possible bladder neoplasm.    GASTROINTESTINAL:  # Acute Lower GI Bleed s/p empiric sigmoid branches of the IMA and single coil placed in the superior rectal artery  # Sigmoid Diverticulosis   - IR following  - Consult GI for ongoing LGIB   - Empiric fem-stop placed and knee immobilizer due to patient movement/unable to be redirected  - Nutrition: NPO   - Bowel Regimen hold in acute setting   - GI ppx: Not indicated     INFECTIOUS DISEASE:   - Monitor off antibiotics   - F/u Blood cultures     HEMATOLOGY/ONCOLOGY:   # Acute blood loss anemia  # History of LLE DVT in 2022 on apixaban  - Trend CBC every 6 hours  - Trend coags Q6H   - Will need to further investigate situation regarding DVT, would consider stopping anticoagulation permanently given history of frequent falls, age, and life-threatening GI bleed.  - VTE ppx: SCDs only    ENDOCRINOLOGY/RHEUMATOLOGY:   - Hypoglycemia protocol     FAMILY UPDATE:   Patient's wife was updated at bedside by ICU team, ED called to update their daughter, they were appreciative and all questions were answered.     Called and updated patients daughter post IR procedure, wife at bedside.     Advance Care Planning: Open ACP Navigator  Code Status: Full Code  Next of Kin:  Primary Emergency Contact: Stingley,Murray           ADVANCE CARE PLAN                 This patient is critically ill with life-threatening  condition(s) and a high probability of sudden clinically significant deterioration due to the condition(s) noted in the assessment and plan, which requires the highest level of physician/advance practice provider preparedness to intervene urgently. Full attention to the direct care of this patient was provided for the period of time noted below. Any critical care time performed today is exclusive of teaching and billable procedures and not overlapping with any other physicians or advance practice providers.    I have personally assessed the patient and based my assessment and medical decision-making on a review of the patient's history and 24-hour interval events along with medical records, physical examination, vital signs, analysis of recent laboratory results, evaluation of radiology images, monitoring data  for potential decompensation, and additional findings found in detail within ICU team notes. The findings and plan of care was discussed with the care team.    Total critical care time: 85 minutes during this encounter.    Bettina Gavia, [place]   05/21/2023 5:17 AM                                     [1]   Past Surgical History:  Procedure Laterality Date    ABDOMINAL SURGERY      Hernia repair   [2]   Family History  Problem Relation Name Age of Onset    Heart disease Father      Stroke Father      Diabetes Maternal Aunt     [3]   Allergies  Allergen Reactions    Penicillins      Disorientation- per patient, no allergy as of 03/7023

## 2023-05-21 NOTE — Progress Notes (Signed)
Patient seen and examined at bedside.  Patient's slow to respond but somewhat awake.  Patient had a FemoStop which was removed, H&H so far has been stable, patient on 4 of Levophed unable to wean, additional fluids ordered, there is no evidence of active bleeding noted suspect likely hypovolemia and sedation related, IR has been called to evaluate if the lower leg brace can be removed and patient can be more mobile, patient family at bedside assisting care, continue Rocephin for UTI, continue home Seroquel however increased to 25 twice daily for agitation, resume home Flomax, Levophed gradually weaning with fluid bolus given, hold off on all further sedation for now.      Additional critical care time spent in procedures 45 minutes

## 2023-05-21 NOTE — Procedures (Addendum)
[name] Mam Kostelnik to C- Lab for Visceral Embo with Dr. Excell Seltzer.  Pre-procedure pause completed-all in agreement.     Patient transferred to table in supine position.  44fr. slender arterial sheath to right femoral artery.  Sheath discontinued and AngioSeal deployed @ 0406.  No signs or symptoms of bleeding noted. V-Pad in place, to be removed in 12 hr 11/26 @ 1610.  Bilateral petal pulse dopplerable.  Bedrest for 4 hrs until 0810.      Patient tolerated procedure poorly, pt combative during procedure. ICU MD called at bedside due to hypotension and patient being combative, risking safety.  200 mcg Fentanyl given  2.5 mg midazolam given     Patient transferred to MSICU with ICU PA, ICU Charge, IR RN. Report given to ICU RN.

## 2023-05-21 NOTE — UM Notes (Signed)
Order [id number]   05/21/23 0136  Admit to Inpatient  Once          Initial Review  Class: Inpatient  Level of Care: Level 1 (ICU)       Patient Name: Dillon Wood, Dillon Wood   Date of Birth 03/19/1929   MRN: [id number]          Summary:  Dillon Wood is a 89 y.o. male who presented to the hospital with complaint of "acute onset lower GI bleeding and ground-level fall in bathroom."    "While in the ED patient had large-volume BRBPR with significant clot burden, repeat CBC with hemoglobin 7.7.  CXR with patchy airspace opacities, CT head negative for acute intracranial findings.  CTA abdomen pelvis with active hemorrhage in the sigmoid colon likely due to diverticulosis.  In ED patient received 2 L IVF, Kcentra for apixaban reversal, IV TXA, and 2 units PRBC with improvement in blood pressure, lactate normalized. Patient went emergently to IR with Dr. Excell Seltzer, who noted no active extravasation noted. Empiric Gelfoam embolization of sigmoid branches of the IMA performed. Single coil placed in the superior rectal artery as well. Hypotensive in IR suite. MTP activated. Admitted to ICU postprocedure."        Medical History:  Past Medical History:   Diagnosis Date    Closed fracture of left side of maxilla 04/03/2017    DVT (deep venous thrombosis)     Facial contusion, initial encounter 04/03/2017    Gastroesophageal reflux disease     Hyperlipemia     Left orbit fracture, closed, initial encounter 04/03/2017    PVD (peripheral vascular disease)            Active Problems:  Patient Active Problem List   Diagnosis    Traumatic subarachnoid hemorrhage without loss of consciousness, initial encounter    Squamous cell carcinoma of head and neck    Frequent falls    Carotid artery stenosis    Bladder stone    Memory loss    PVD (peripheral vascular disease)    Neuropathy    Acute deep vein thrombosis (DVT) of femoral vein of left lower extremity    Advanced directives, counseling/discussion    Open wound of plantar  aspect of foot, left, sequela    Skin lesions    GI bleed           Care Day - 05/21/2023    Per H&P Note:  Plan      NEUROLOGICAL:   # History of dementia  # Acute metabolic encephalopathy, secondary to shock  - Delirium precautions, CAM-ICU monitoring  - Pain control with as needed Tylenol  - PRN IV haldol   - Precedex for procedure, wean off as able for RAS goal 0 to -1   - Continue home nightly Seroquel  - Will plan to order PT/OT when stabilized     CARDIOVASCULAR:   # Hemorrhagic shock  # History of peripheral vascular disease s/p stent  - MAP goal > 65   - Volume resuscitation 2u PRBC, 2u FFP and 2L IVF   - Remains hypotensive - MTP activated   - Continuous telemetry monitoring   - Hold home aspirin and statin in acute setting     PULMONARY:   - Supplemental oxygen as needed to keep sat > 92%  - CXR with diffuse patchy airspace opacities     RENAL:   # CKD  Creatinine 1.6 from baseline 1.5  - Trend Renal function  -  Replace electrolytes as necessary  - Trend lactate Q6H   - Incidental finding of abnormal thickening of the urinary bladder, consider urology consult for further management/evaluation, concern for possible bladder neoplasm.     GASTROINTESTINAL:  # Acute Lower GI Bleed s/p empiric sigmoid branches of the IMA and single coil placed in the superior rectal artery  # Sigmoid Diverticulosis   - IR following  - Consult GI for ongoing LGIB   - Empiric fem-stop placed and knee immobilizer due to patient movement/unable to be redirected  - Nutrition: NPO   - Bowel Regimen hold in acute setting   - GI ppx: Not indicated      INFECTIOUS DISEASE:   - Monitor off antibiotics   - F/u Blood cultures      HEMATOLOGY/ONCOLOGY:   # Acute blood loss anemia  # History of LLE DVT in 2022 on apixaban  - Trend CBC every 6 hours  - Trend coags Q6H   - Will need to further investigate situation regarding DVT, would consider stopping anticoagulation permanently given history of frequent falls, age, and life-threatening GI  bleed.  - VTE ppx: SCDs only     ENDOCRINOLOGY/RHEUMATOLOGY:   - Hypoglycemia protocol            Per Brief Op Note:  Operative Procedure:      Procedure(s):  EMBOLIZATION ARTERIAL GENERAL     Preoperative Diagnosis:      Pre-Op Diagnosis Codes:      * GI bleed [K92.2]     Postoperative Diagnosis:      Same as preoperative diagnosis     Anesthesia:      Moderate Sedation and Local with 1% Lidocaine     A safety timeout was performed.   Estimated Blood Loss:      Minimal     Specimens:      None     Findings:      No active extravasation noted.  Empiric Gelfoam embolization of sigmoid branches of the IMA performed.  Single coil placed in the superior rectal artery as well.     Complications:      None          Vitals:  Patient Vitals for the past 24 hrs:   BP Temp Temp src Pulse Resp SpO2 Height Weight   05/21/23 0814 114/55 -- -- -- -- -- -- --   05/21/23 0757 -- (!) 96.7 F (35.9 C) Temporal -- -- -- -- --   05/21/23 0700 90/51 -- -- (!) 58 14 99 % -- --   05/21/23 0600 106/57 -- -- 71 18 98 % -- --   05/21/23 0527 (!) 71/34 -- -- -- -- -- -- --   05/21/23 0511 -- -- -- -- 16 -- -- --   05/21/23 0500 (!) 75/48 -- -- 74 15 90 % -- --   05/21/23 0452 -- -- -- -- -- 93 % -- --   05/21/23 0425 119/58 -- -- (!) 103 22 93 % -- --   05/21/23 0420 132/58 -- -- 100 16 97 % -- --   05/21/23 0415 102/57 -- -- (!) 105 (!) 11 98 % -- --   05/21/23 0414 102/57 -- -- (!) 103 (!) 11 98 % -- --   05/21/23 0410 (!) 82/52 -- -- (!) 107 (!) 11 97 % -- --   05/21/23 0405 96/52 -- -- (!) 104 (!) 10 98 % -- --   05/21/23 0400 105/57 -- -- Marland Kitchen  117 17 98 % -- --   05/21/23 0355 (!) 79/48 -- -- (!) 107 13 98 % -- --   05/21/23 0350 (!) 70/51 -- -- (!) 109 15 99 % -- --   05/21/23 0345 (!) 64/43 -- -- 100 (!) 8 99 % -- --   05/21/23 0340 (!) 83/45 -- -- (!) 102 14 99 % -- --   05/21/23 0335 94/47 -- -- (!) 101 12 99 % -- --   05/21/23 0330 97/54 -- -- (!) 107 13 100 % -- --   05/21/23 0325 90/85 -- -- (!) 111 19 100 % -- --   05/21/23 0320  116/58 -- -- 100 14 100 % -- --   05/21/23 0315 100/55 -- -- 95 21 100 % -- --   05/21/23 0310 (!) 88/53 -- -- 93 18 100 % -- --   05/21/23 0305 98/55 -- -- 89 (!) 26 100 % -- --   05/21/23 0300 108/58 -- -- 86 (!) 11 100 % -- --   05/21/23 0248 107/65 97.5 F (36.4 C) -- 85 17 -- -- --   05/21/23 0232 106/56 -- -- 98 16 98 % -- --   05/21/23 0220 90/52 -- -- 82 16 96 % -- --   05/21/23 0206 (!) 88/51 97.4 F (36.3 C) -- 83 16 -- -- --   05/21/23 0203 (!) 83/50 -- -- 87 16 96 % -- --   05/21/23 0158 (!) 84/52 97.4 F (36.3 C) -- 85 16 -- -- --   05/21/23 0142 101/54 97.3 F (36.3 C) -- 90 16 -- -- --   05/21/23 0056 105/53 -- -- 89 16 93 % -- --   05/21/23 0046 102/51 -- -- 90 18 97 % -- --   05/21/23 0000 102/59 -- -- 96 16 97 % -- --   05/20/23 2339 106/55 -- -- 80 16 99 % -- --   05/20/23 2332 90/51 -- -- 78 16 98 % -- --   05/20/23 2322 92/51 -- -- 77 16 97 % -- --   05/20/23 2304 (!) 75/48 97.3 F (36.3 C) Oral 90 16 96 % 1.753 m (5\' 9" ) 70.7 kg (155 lb 12.8 oz)       SpO2: 99 % (05/21/2023  7:00 AM)  O2 Device: Nasal cannula (05/21/2023  6:00 AM)  O2 Flow Rate (L/min): 4 L/min (05/21/2023  7:00 AM)      Weight:   Recent Weights for the past 720 hrs (Last 3 readings):   Weight   05/20/23 2304 70.7 kg (155 lb 12.8 oz)           Labs:   Latest Reference Range & Units 05/20/23 23:13 05/21/23 00:54 05/21/23 04:55 05/21/23 06:30   WBC 3.10 - 9.50 x10 3/uL 9.57 (H)  10.89 (H)    Hemoglobin 12.5 - 17.1 g/dL 9.1 (L) 7.7 (L) 9.2 (L)    Hematocrit 37.6 - 49.6 % 27.4 (L) 24.0 (L) 27.3 (L)    Platelet Count 142 - 346 x10 3/uL 116 (L)  84 (L)    Glucose 70 - 100 mg/dL 130 (H)  865 (H)    Creatinine 0.5 - 1.5 mg/dL 1.6 (H)      Chloride 99 - 111 mEq/L 108  114 (H)    Calcium 7.9 - 10.2 mg/dL 8.5  7.3 (L)    EGFR >=78.4 mL/min/1.73 m2 40.4 (L)  51.9 (L)    Calcium Ionized 2.23 - 2.56 mEq/L  2.22 (L)    Whole Blood Lactate 0.2 - 2.0 mmol/L 2.3 (H)      Albumin 3.5 - 5.0 g/dL 2.9 (L)      Protein Total 6.0 - 8.3 g/dL 5.4  (L)      PT 21.3 - 12.9 sec 22.6 (H)  17.8 (H)    Specific Gravity, UA 1.001 - 1.035     1.050 (H)   Leukocyte Esterase, UA Negative     Large !   Protein, UR Negative     100= 2+ !   Blood, UA Negative     Large !   RBC UA 0 - 5 /hpf    Too numerous to count !   WBC, UA None Seen, 0-5 /hpf    Too numerous to count !   Urine Mucus None     Present !         Imaging:  CT Angiogram Abdomen Pelvis    Result Date: 05/21/2023   1.Active hemorrhage within the sigmoid colon likely due to sigmoid diverticulosis. Recommend interventional radiology consultation to guide further management. 2. There is no definite evidence of acute inflammatory process. 3. Incidental note is made of gallstones, left renal calculi, prostate enlargement, and abnormal thickening of the urinary bladder raising concern for hypertrophy. 4. The possibility of bladder neoplasm cannot be excluded. Consider urology consultation to guide further management. 5. Other findings as noted above. 6. Critical findings were discussed with, and read back by, Dr. Rennis Harding at the following time 05/21/2023 1:05 AM. Miguel Dibble, MD 05/21/2023 1:09 AM    CT Angiogram Abdomen Pelvis    Result Date: 05/21/2023  1.Diffuse interstitial prominence may be secondary to mild pulmonary edema. 2.Patchy airspace opacities in the lung bases may represent atelectasis or pneumonia versus underlying interstitial lung disease. Judd Gaudier, MD 05/21/2023 12:54 AM          Medications:  Current Facility-Administered Medications   Medication Dose Route Frequency    atorvastatin  40 mg Oral Daily    cefTRIAXone  1 g Intravenous Q24H    ondansetron  4 mg Intravenous Once    QUEtiapine  25 mg Oral Q12H    senna-docusate  1 tablet Oral Q12H SCH         One Time Medications:  Medications 05/20/23   sodium chloride 0.9 % bolus 1,000 mL  Dose: 1,000 mL  Freq: Once Route: IV  Last Dose: Stopped (05/21/23 0020)  Start: 05/20/23 2312 End: 05/21/23 0020    [id number]                   Medications 05/21/23    prothrombin complex conc human (KCENTRA) 2,144 Units in 60 mL IVPB  Dose: 2,144 Factor IX units  Freq: Once Route: IV  Last Dose: Stopped (05/21/23 0131)  Start: 05/21/23 0053 End: 05/21/23 0131   Order specific questions:       [id number]   [id number]                0.9% NaCl infusion  Dose: 50 mL  Freq: Once Route: IV  Last Dose: 50 mL (05/21/23 0137)  Start: 05/21/23 0053 End: 05/21/23 0137    [id number]                   calcium gluconate 1 g in 50 mL premix  Dose: 1 g  Freq: Every 1 hour Route: IV  Last Dose: Stopped (05/21/23 0743)  Start: 05/21/23 0715 End: 05/21/23 0102  [id number]   [id number]                  sodium chloride 0.9 % bolus 1,000 mL  Dose: 1,000 mL  Freq: Once Route: IV  Last Dose: Stopped (05/21/23 1308)  Start: 05/21/23 0123 End: 05/21/23 6578    46962   95284                sodium chloride 0.9 % bolus 1,000 mL  Dose: 1,000 mL  Freq: Once Route: IV  Last Dose: Stopped (05/21/23 0020)  Start: 05/20/23 2312 End: 05/21/23 0020    [id number]                 tranexamic acid (CYKLOKAPRON) in sodium chloride 0.7% (premix) 1,000 mg  Dose: 1,000 mg  Freq: Once Route: IV  Last Dose: Stopped (05/21/23 0513)  Start: 05/21/23 0430 End: 05/21/23 1027    [id number]   [id number]                  tranexamic acid (CYKLOKAPRON) in sodium chloride 0.7% (premix) 1,000 mg  Dose: 1,000 mg  Freq: Once Route: IV  Last Dose: Stopped (05/21/23 0131)  Start: 05/21/23 0053 End: 05/21/23 0131    [id number]   [id number]                    Continuous IV Infusions:   norepinephrine 3 mcg/min (05/21/23 0814)     Medications 05/21/23   dexmedeTOMIDine (PRECEDEX) 400 mcg in sodium chloride 0.9 % 100 mL infusion (premix)  Rate: 0-26.5 mL/hr Dose: 0-1.5 mcg/kg/hr  Weight Dosing Info: 70.7 kg  Freq: Continuous Route: IV  Last Dose: Stopped (05/21/23 0600)  Start: 05/21/23 0445 End: 05/21/23 3295      18841   66063   01601   09323   04455   55732   05007   20254   05239   0537-D/C'd  270623     0537-D/C'd  [id number]   0537-D/C'd  [id number]                        PRN  Medications:  Medications 05/21/23   diphenhydrAMINE (BENADRYL) injection 25 mg  Dose: 25 mg  Freq: once PRN Route: IV  PRN Reason: Other  PRN Comment: only if patient remains anxious after second dose of fentanyl and midazolam (Versed) with provider approval  Start: 05/21/23 0236 End: 05/21/23 0329    [id number]                 fentaNYL (PF) (SUBLIMAZE) injection 25 mcg  Dose: 25 mcg  Freq: Every 5 min PRN Route: IV  PRN Reason: Other  PRN Comment: Moderate Sedation  Start: 05/21/23 0235 End: 05/21/23 0538    06269   48546   27035   03505   00938   0538-D/C'd          haloperidol lactate (HALDOL) injection 2.5 mg  Dose: 2.5 mg  Freq: Every 2 hours PRN Route: IV  PRN Reason: Agitation  Start: 05/21/23 0453    05097   [id number]                iohexol (OMNIPAQUE) 350 MG/ML injection 100 mL  Dose: 100 mL  Freq: IMG once as needed Route: IV  PRN Reason: contrast  Start: 05/21/23 0014 End: 05/21/23 0014    [id number]  lidocaine (XYLOCAINE) 1 % injection  Freq: Code/trauma/sedation medication  Start: 05/21/23 0317 End: 05/21/23 0317    [id number]                 midazolam (VERSED) injection 0.5 mg  Dose: 0.5 mg  Freq: Every 5 min PRN Route: IV  PRN Reason: sedation  PRN Comment: Moderate Sedation  Start: 05/21/23 0236 End: 05/21/23 0538    [id number]   [id number]   237628   [id number]   15   0538-D/C'd                        Doree Albee RN, BSN  UR Case Manager  [facility]  [email or web address]  Phone: [phone]  Fax: [phone]  8:55 AM  05/21/2023              UTILIZATION REVIEW CONTACT: Name:  Doree Albee, RN BSN  Utilization Review  [facility]  Address:  [address], [place], [place] [address]  NPI:   [id number]  Tax ID:  [id number]  Phone: [phone], Option 2  Fax:  [phone]    Please use fax number [phone] to provide authorization for hospital services or to request additional information.        NOTES TO REVIEWER:    This clinical review is based on/compiled from documentation provided by  the treatment team within the patient's medical record.

## 2023-05-21 NOTE — Consults (Signed)
Gastroenterology  CONSULT NOTE  Gastroenterology Consult Service - [facility]  Pager ID: [phone]  Epic Chat (Group): [facility]  [address] [place], [place] [address]  Appointments: [phone]  Date of admission: 05/20/2023   Date/time of consult 05/21/23 1:01 PM    Patient Name: Dillon Wood, Dillon Wood  Consulting Provider: Dr. Jeanella Flattery      Reason for Consultation / Chief Complaint:   We are asked to see this patient for LGIB    Assessment:   LGIB 2/2 sigmoid diverticulosis  - +CTA, s/p IR embolization 11/26  Hemorrhagic Shock on pressor support  Acute blood loss anemia  Hematochezia on presentation   Dementia  DVT on eliquis (held)  Hx of colon polyps     Dillon Wood is a 89 y.o. male p/w painless hematochezia in the setting of eliquis use, weakness, fall found to have acute blood loss anemia w/ assoc hemorrhagic shock, +CTA for bleeding in the sigmoid s/p emergent embolization by IR earlier this morning 11/26.  Plan:   - continue close monitoring in ICU  - pressor support  - trend H/H, transfuse PRN  - if re-bleeds then can perform flex sig  - please contact immediately if any changes in his hemodynamics/status    Patient discussed and reviewed with Dr. Jeanella Flattery  History:   Dillon Wood is a 89 y.o. 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    Hgb dropped as low as 7.7, baseline 12.4 (08/27/22).  He was hypotensive and tachycardic, requiring levophed.     ER physical exam notable for: Large amount of dark red blood and clots coming out of the rectum.  CTA was + for bleeding in the sigmoid s/p embolization by IR.    S/p 3 u FFP, 2 u PRBC.    Remains in ICU now w/ no episodes of rectal bleeding this morning.  On levo 4 mcg/hr.  Hgb 9.7 now.     Daughter and wife at bedside.  Pt very hard of  hearing w/out his hearing aids at this time.    GI PROCEDURES    Colonoscopy 06/22/11:  TRANSVERSE POLYP:       TUBULAR ADENOMA FRAGMENTS, NEGATIVE FOR HIGH GRADE DYSPLASIA     Colonoscopy 06/21/08:  A.  COLON, HEPATIC FLEXURE POLYP, BIOPSY:  SERRATED ADENOMA   B.  CECAL POLYP, BIOPSY:  TUBULAR ADENOMA   C.  SIGMOID COLON, POLYP, BIOPSY:  TUBULAR ADENOMA     Colonoscopy in system 06/21/04:  IMPRESSION:      1.  TWO SMALL POLYPS IN THE SIGMOID. [211.3]. TWO POLYPS WERE REMOVED BY      COLD SNARE.      2.  SINGLE SMALL POLYP IN THE RECTUM. [211.4]. THE POLYP WAS REMOVED BY      SNARE CAUTERY.      3.  SMALL INTERNAL HEMORRHOIDS [455.0].      4.  THERE WAS NO EVIDENCE OF DIVERTICULOSIS.      5.  COLONOSCOPY, OTHERWISE NORMAL.      A.  SIGMOID POLYPS, POLYPECTOMY:           TUBULAR ADENOMA, NEGATIVE FOR HIGH GRADE DYSPLASIA AND MALIGNANCY   B.  RECTAL POLYP, POLYPECTOMY:       1.  SUBMUCOSAL LEIOMYOMA       2.  NO  ADENOMAS OR MALIGNANCY NOTED       Past Medical History:   Medical History[1]      Past Surgical History:   Past Surgical History[2]    Family History:   Family History[3]    Social History:     Social History     Socioeconomic History    Marital status: Married     Spouse name: Not on file    Number of children: 4    Years of education: Not on file    Highest education level: Not on file   Occupational History    Occupation: Surveyor, [employer] of schools   Tobacco Use    Smoking status: Never     Passive exposure: Never    Smokeless tobacco: Never   Vaping Use    Vaping status: Never Used   Substance and Sexual Activity    Alcohol use: Yes     Comment: 3 per week    Drug use: No    Sexual activity: Not on file   Other Topics Concern    Not on file   Social History Narrative    Not on file     Social Drivers of Health     Financial Resource Strain: Not on file   Food Insecurity: Patient Unable To Answer (05/21/2023)    Hunger Vital Sign     Worried About Running Out of Food in the Last Year: Patient unable to  answer     Ran Out of Food in the Last Year: Patient unable to answer   Transportation Needs: Patient Unable To Answer (05/21/2023)    PRAPARE - Transportation     Lack of Transportation (Medical): Patient unable to answer     Lack of Transportation (Non-Medical): Patient unable to answer   Physical Activity: Not on file   Stress: Not on file   Social Connections: Not on file   Intimate Partner Violence: Patient Unable To Answer (05/21/2023)    Humiliation, Afraid, Rape, and Kick questionnaire     Fear of Current or Ex-Partner: Patient unable to answer     Emotionally Abused: Patient unable to answer     Physically Abused: Patient unable to answer     Sexually Abused: Patient unable to answer   Housing Stability: Patient Unable To Answer (05/21/2023)    Housing Stability Vital Sign     Unable to Pay for Housing in the Last Year: Patient unable to answer     Number of Times Moved in the Last Year: 1     Homeless in the Last Year: Patient unable to answer       Allergies:   Allergies[4]    Medications:     Current Facility-Administered Medications   Medication Dose Route Frequency    atorvastatin  40 mg Oral Daily    cefTRIAXone  1 g Intravenous Q24H    lidocaine  1 patch Transdermal Q24H    OLANZapine  2.5 mg Oral Q12H SCH    ondansetron  4 mg Intravenous Once    QUEtiapine  25 mg Oral Q12H    senna-docusate  1 tablet Oral Q12H SCH    tamsulosin  0.4 mg Oral Daily after dinner      norepinephrine 2 mcg/min (05/21/23 1233)       Home Medications       Med List Status: In Progress Set By: Drucie Ip, RN at 05/20/2023 11:11 PM  apixaban (ELIQUIS) 5 MG     Take 1 tablet (5 mg) by mouth every 12 (twelve) hours     aspirin EC 81 MG EC tablet     Take 1 tablet (81 mg total) by mouth daily     atorvastatin (LIPITOR) 40 MG tablet     Take 1 tablet (40 mg total) by mouth daily     desonide (DESOWEN) 0.05 % ointment     Apply topically 2 (two) times daily Apply bid to scalp/face until reaction improved      mupirocin (BACTROBAN) 2 % ointment     Apply bid to face/scalp lesions until healed     niacinamide 500 MG tablet     Take 1 tablet (500 mg) by mouth 2 (two) times daily with meals     QUEtiapine (SEROquel) 25 MG tablet (Expired)     Take 1 tablet (25 mg) by mouth nightly     tamsulosin (FLOMAX) 0.4 MG Cap     Take 1 capsule (0.4 mg) by mouth Daily after dinner     triamcinolone (KENALOG) 0.1 % ointment     Apply bid to eczema on lower body until clear, then use PRN             Review of Systems:     Constitutional  Negative for fevers or weight loss  See HPI   Skin  Negative for rash, itching   HENT  Negative for sore throat   Eyes  Negative for blurred vision   Cardiovascular  Negative for chest pain   Respiratory  Negative for SOB or cough   Gastrointestinal  See HPI   Genitourinary  Negative for dysuria, hematuria, or frequency   Heme  Negative for anemia   Neurological  Negative for confusion   Psych  Negative for depression     All other systems reviewed and are negative    Physical Exam:   Visit Vitals  BP 125/57   Pulse 90   Temp (!) 96.7 F (35.9 C) (Temporal)   Resp 16   Ht 1.753 m (5\' 9" )   Wt 70.7 kg (155 lb 12.8 oz)   SpO2 97%   BMI 23.01 kg/m     Body mass index is 23.01 kg/m.    Temp (24hrs), Avg:97.3 F (36.3 C), Min:96.7 F (35.9 C), Max:97.5 F (36.4 C)      Physical Exam:  GENERAL APPEARANCE: alert, in no acute distress, well developed, well nourished    HENT:  Head is normocephalic/atraumatic. Eyes w/ no scleral icterus. mucous membrane were moist.   LUNGS: normal effort, no clubbing or cyanosis, no audible wheezing.  HEART: well perfused, no LE edema.  ABDOMEN: soft, non tender, non distended, bs active.  MUSCULOSKELETAL: moves all extremities x4.  SKIN: warm and dry.  NEURO: oriented to time, place, and person.   PSYCHIATRIC: normal affect        Lab:     Recent Labs   Lab 05/21/23  0905 05/21/23  0455 05/21/23  0054 05/20/23  2313   WBC 10.93* 10.89*  --  9.57*   Hemoglobin 9.7* 9.2*  7.7* 9.1*   Hematocrit 28.5* 27.3* 24.0* 27.4*   Platelet Count 75* 84*  --  116*   MCV 91.9 93.5  --  94.8   Neutrophils %  --   --   --  72.1     Recent Labs   Lab 05/21/23  0455 05/20/23  2313   Sodium 137 139  Potassium 4.4 4.0   Chloride 114* 108   CO2 18 22   BUN 21 23   Creatinine 1.3 1.6*   Glucose 179* 133*   Calcium 7.3* 8.5   Protein, Total  --  5.4*   Albumin  --  2.9*   AST (SGOT)  --  19   ALT  --  14   Alkaline Phosphatase  --  89   Bilirubin, Total  --  0.5     Glucose:    Recent Labs   Lab 05/21/23  0455 05/20/23  2313   Glucose 179* 133*     Recent Labs   Lab 05/21/23  0903 05/21/23  0455 05/20/23  2313   PT 16.6* 17.8* 22.6*   INR 1.5 1.6 2.0   PTT 28 30 32     Recent Labs   Lab 05/20/23  2313   hs Troponin <2.7     No results found for: "LIP"     Labs Reviewed.     Radiology:     Radiology Results (24 Hour)       Procedure Component Value Units Date/Time    Embolization Arterial General [[id number]] Collected: 05/21/23 0944    Order Status: Completed Updated: 05/21/23 1117    Narrative:      Procedure: Inferior mesenteric arteriogram, sigmoid artery arteriogram,  superior rectal artery arteriogram, sigmoid artery Gelfoam embolization and  superior rectal artery coil embolization with ultrasound-guided arterial  access    History: Patient is a 89 year old male with acute lower GI bleed and  hemodynamic instability with CTA revealing acute arterial extravasation  within the sigmoid colon. He presents for emergent arteriogram and  embolization    Operator(s): Genene Churn. Excell Seltzer, M.D.  Advanced Practice Provider: None    Contrast:  Contrast agent: Visipaque 320  Contrast volume (mL): 50    Radiation Parameters:  Fluoroscopy time (minutes): 15.6    Reference air kerma (mGy): 208.1     Consent: Informed consent for the procedure including risks, benefits and  alternatives was obtained. A safety timeout was performed and documented  with all members of the procedure team present, including verification  of  patient identification, correct procedure, procedure site, and laterality.  The patient was positioned supine.     Preparation: The site was prepared and draped using all elements of maximal  sterile barrier technique including sterile gloves, sterile gown, cap,  mask, large sterile sheet, sterile ultrasound probe cover, sterile  ultrasound gel, hand hygiene and cutaneous antisepsis with 2%  chlorhexidine.  Medical reason for site preparation exception: Not applicable.    Anesthesia: Moderate sedation with Fentanyl and Versed was administered  with appropriate physiologic monitoring using an independent trained  observer. Sedation was administered under direct physician supervision for  64 minutes. 1% lidocaine local was administered as well.    Sedation Start Time: 0300  Sedation End Time: 0404  Fentanyl Dosage:  200 mcg  Versed Dosage:   2.5 mg    Technique:     Patient's right groin was prepped and draped in usual sterile fashion.  Local anesthesia was administered into the subcutaneous tissues. Using  direct ultrasound guidance and a 21-gauge micropuncture needle, the right  common femoral artery was catheterized. A 0.018-in. Guidewire was advanced  centrally. The needle was removed and a 5 [place] micropuncture catheter was  advanced over the guidewire. The guidewire and transitional dilator were  removed. A 0.035-in. Guidewire was advanced into the abdominal aorta. The  micropuncture catheter was  removed and a 6 [place] vascular sheath was  advanced into the right external iliac artery and attached to constant  hemodynamic monitoring.    A 5 French Omni 2 selective catheter was advanced into the inferior  mesenteric artery. Inferior mesenteric arteriogram was performed.    In coaxial fashion, a 0.021 inch microcatheter was advanced to the sigmoid  artery. Sigmoid arteriogram was performed. Subselective catheterization of  the superior and inferior branches of the sigmoid artery was performed.  Empiric  Gelfoam embolization of these vessels was performed.    Subsequent, a 0.028 inch microcatheter was coaxially introduced into the  superior rectal artery. Superior rectal arteriogram was performed. Empiric  coil embolization was performed of this vessel.     The catheter and sheath were removed and hemostasis was achieved with  Angio-Seal vascular closure device..    Findings:    Direct ultrasound guidance was used to gain access to the right common  femoral artery. The right common femoral artery is patent. An ultrasound  image of the needle within the artery was obtained and placed in the  permanent medical record.     Superior mesenteric arteriogram reveals a normal branching pattern of the  vessel. Portal vein is imaging was unremarkable. No extravasation of  contrast was noted. Given patient's continued hemodynamic instability and  the CT findings, empiric Gelfoam embolization of the sigmoid artery and its  branches was performed. Superior rectal arteriogram was unremarkable  without evidence of extravasation. Empiric coil embolization of the mid  superior rectal artery was performed as well.    All indicated images were saved to PACS.      Impression:         1. No active bleeding identified.  2. Empiric Gelfoam embolization of the sigmoid artery and its branches.  3. Empiric coil embolization of the superior rectal artery.    Larrie Kass, MD  05/21/2023 11:15 AM    CT Angiogram Abdomen Pelvis [[id number]] Collected: 05/21/23 0056    Order Status: Completed Updated: 05/21/23 0111    Narrative:      HISTORY: rectal bleeding, abd pain, hypotension. . Evaluate for GI  bleeding.    COMPARISON: None available.    TECHNIQUE: CT abdomen and pelvis WITH intravenous contrast. Abdominal CTA  was performed with a GI bleed protocol. Oral contrast was not administered.  3D MIP images are submitted and reviewed. The following dose reduction  techniques were utilized: Automated exposure control and/or adjustment of  the mA  and/or kV according to patient size, and the use of iterative  reconstruction technique.    CONTRAST: iohexol (OMNIPAQUE) 350 MG/ML injection 100 mL.     FINDINGS: Coronary artery calcifications are noted. Findings of  interstitial fibrosis are present in both lung bases. The lung bases show  no consolidating infiltrate. A tiny sliding hiatal hernia is noted.    The precontrast images show no high density material within the bowel. The  postcontrast images show a focus of active arterial hemorrhage in the  distal sigmoid colon. Diverticulosis is present and a diverticular bleed is  considered most likely.    The liver, spleen, pancreas, adrenal glands, and kidneys show no solid  masses. Bilateral renal cysts are noted. The bile ducts are nondilated.     The gallbladder shows cholelithiasis without evidence of cholecystitis.  There is no evidence of choledocholithiasis. The portal vein, SMV and other  central mesenteric veins are patent. Small left renal calculi are noted  none measuring larger than  2 mm. The kidneys show no hydronephrosis. The  renal parenchyma enhances symmetrically; there is no sign of acute  pyelonephritis. There is no ureteral dilatation and no definite ureteral  calculi are seen. There is no CT evidence of pancreatitis. The SMA, celiac  trunk, IMA and renal arteries are patent. These vessels show no high-grade  stenosis. The abdominal aorta is calcified but not aneurysmal. There is no  retroperitoneal adenopathy, hemorrhage or mass.    There is no CT evidence of colitis or diverticulitis. Diverticulosis is  also noted in the descending colon. No definite bowel abnormalities are  seen. A normal-appearing appendix is visualized. No definite neoplastic  bowel lesions are identified. No omental or mesenteric abnormality is seen.  There is no intraperitoneal free air or ascites.    The prostate gland is mildly enlarged. The prostate gland measures 6.0 x  3.8 cm. There is no pelvic sidewall  adenopathy. The urinary bladder shows  marked irregular thickening and bladder diverticula suggesting bladder  hypertrophy. However, the possibility of a bladder mass cannot be excluded.      The abdominal wall soft tissues show no significant findings. No acute bony  abnormalities are identified. The bones appear osteopenic. No neoplastic  appearing bone lesions are identified. Degenerative changes are noted in  the spine and hips.      Impression:           1.Active hemorrhage within the sigmoid colon likely due to sigmoid  diverticulosis. Recommend interventional radiology consultation to guide  further management.   2. There is no definite evidence of acute inflammatory process.  3. Incidental note is made of gallstones, left renal calculi, prostate  enlargement, and abnormal thickening of the urinary bladder raising concern  for hypertrophy.   4. The possibility of bladder neoplasm cannot be excluded. Consider urology  consultation to guide further management.   5. Other findings as noted above.  6. Critical findings were discussed with, and read back by, Dr. Rennis Harding at  the following time 05/21/2023 1:05 AM.        Miguel Dibble, MD  05/21/2023 1:09 AM    XR Chest  AP Portable [[id number]] Collected: 05/21/23 0052    Order Status: Completed Updated: 05/21/23 0056    Narrative:      HISTORY:  Hypotension, altered mental status.     COMPARISON: 08/28/2022.    FINDINGS:   Heart is enlarged. Diffuse interstitial prominence. Patchy airspace  opacities in the lung bases. No sizable pleural effusion. No evidence of  pneumothorax.      Impression:        1.Diffuse interstitial prominence may be secondary to mild pulmonary edema.  2.Patchy airspace opacities in the lung bases may represent atelectasis or  pneumonia versus underlying interstitial lung disease.    Judd Gaudier, MD  05/21/2023 12:54 AM    CT Head without Contrast [[id number]] Collected: 05/21/23 0028    Order Status: Completed Updated: 05/21/23 0031    Narrative:       HISTORY: Head trauma. Pain.    COMPARISON: 10/22/2021.    TECHNIQUE: CT of the head performed without intravenous contrast. The  following dose reduction techniques were utilized: automated exposure  control and/or adjustment of the mA and/or KV according to patient size,  and the use of an iterative reconstruction technique.    CONTRAST: None.    FINDINGS:  Stable volume loss and hypodensities in the white matter consistent with  chronic ischemic changes. No evidence of acute infarct. No  mass effect. No  evidence of acute intracranial hemorrhage. No extra-axial collection is  seen.      Impression:         No acute intracranial abnormality.    Vernie Murders, MD  05/21/2023 12:29 AM          Radiological Procedure within last 24 hours reviewed.    Signed by: Roque Cash, PA                       [1]   Past Medical History:  Diagnosis Date    Closed fracture of left side of maxilla 04/03/2017    DVT (deep venous thrombosis)     Facial contusion, initial encounter 04/03/2017    Gastroesophageal reflux disease     Hyperlipemia     Left orbit fracture, closed, initial encounter 04/03/2017    PVD (peripheral vascular disease)    [2]   Past Surgical History:  Procedure Laterality Date    ABDOMINAL SURGERY      Hernia repair   [3]   Family History  Problem Relation Name Age of Onset    Heart disease Father      Stroke Father      Diabetes Maternal Aunt     [4]   Allergies  Allergen Reactions    Penicillins      Disorientation- per patient, no allergy as of 03/7023

## 2023-05-21 NOTE — Progress Notes (Signed)
Date Time: 05/21/23 1:22 PM  Patient Name: Dillon Wood, Dillon Wood    Assessment/Plan:   Patient stable post arteriogram w/ embolization overnight.  Right groin site stable  Bedrest w/ immobilizer as ordered  Continue current medications  Analgesia, antiemetics prn      Discussed with family  Subjective:   Patient currently resting in bed in no distress, currently w/ wrist restraints and RLE immobilizer in place. Family at bedside visiting.    Physical Exam:     Vitals:    05/21/23 1233   BP: 125/57   Pulse: 90   Resp: 16   Temp:    SpO2: 97%     Temp (24hrs), Avg:97.3 F (36.3 C), Min:96.7 F (35.9 C), Max:97.5 F (36.4 C)      APPEARANCE: Ill appearing elderly male in NAD.  ABDOMEN Abdomen soft, non-tender.   PUNCTURE SITE: Right femoral access site dressed CDI. Site soft, flat, no ecchymosis, hematoma, swelling or induration.   EXTREMITIES: Warm and dry bilaterally, palpable DP pulses bilaterally, immobilizer to RLE.  NEURO: A+Ox1    Labs:     Results       Procedure Component Value Units Date/Time    Arterial Blood Gas [[id number]]  (Abnormal) Collected: 05/21/23 1041    Specimen: Blood, Arterial Updated: 05/21/23 1219     Arterial pH 7.257     Arterial pCO2 51.8 mmHg      Arterial pO2 46.4 mmHg      Arterial HCO3 22.5 mEq/L      Arterial CO2 21.7 mEq/L      Arterial Base Excess -4.4 mEq/L      Arterial O2 Saturation 77.2 %      Temperature 36.3 C     Urinalysis with Reflex to Microscopic Exam and Culture [[id number]]  (Abnormal) Collected: 05/21/23 0908    Specimen: Urine, Clean Catch Updated: 05/21/23 0934     Urine Color Straw     Urine Clarity Clear     Urine Specific Gravity 1.044     Urine pH 6.0     Urine Leukocyte Esterase Large     Urine Nitrite Negative     Urine Protein 50= 1+     Urine Glucose Negative     Urine Ketones Negative mg/dL      Urine Urobilinogen Normal mg/dL      Urine Bilirubin Negative     Urine Blood Moderate     RBC, UA Too numerous to count /hpf      Urine WBC Too  numerous to count /hpf      Urine Squamous Epithelial Cells 0-5 /hpf      Urine Mucus Present    Culture, Urine [[id number]] Collected: 05/21/23 0908    Specimen: Urine, Clean Catch Updated: 05/21/23 0934    PT/APTT [[id number]]  (Abnormal) Collected: 05/21/23 0903    Specimen: Blood, Venous Updated: 05/21/23 0929     PT 16.6 sec      INR 1.5     PTT 28 sec     Fibrinogen [[id number]]  (Normal) Collected: 05/21/23 0903    Specimen: Blood, Venous Updated: 05/21/23 0929     Fibrinogen 254 mg/dL     CBC without Differential [[id number]]  (Abnormal) Collected: 05/21/23 0905    Specimen: Blood, Venous Updated: 05/21/23 0918     WBC 10.93 x10 3/uL      Hemoglobin 9.7 g/dL      Hematocrit 34.7 %      Platelet Count 75 x10 3/uL  MPV 8.8 fL      RBC 3.10 x10 6/uL      MCV 91.9 fL      MCH 31.3 pg      MCHC 34.0 g/dL      RDW 14 %      nRBC % 0.0 /100 WBC      Absolute nRBC 0.00 x10 3/uL     Lactic Acid [[id number]]  (Normal) Collected: 05/21/23 0904    Specimen: Blood, Venous Updated: 05/21/23 0917     Whole Blood Lactic Acid 1.2 mmol/L     Platelets - Product [[id number]] Collected: 05/20/23 2323     Updated: 05/21/23 0747     Platelet Product- [facility] PLT PST     Unit Number [id number]     Product Status released     Product Code E8331V00     ISBT CODE 5100     Expiration Date [id number]    [facility] Release Uncrossmatched [[id number]] Collected: 05/20/23 2323     Updated: 05/21/23 0725     RBC Leukoreduced RBC LR     Unit Number [id number]     Product Status released     Product Code E0336V00     ISBT CODE 1700     Expiration Date 564332951884     RBC Leukoreduced RBC LR     Unit Number [id number]     Product Status released     Product Code E0336V00     ISBT CODE 7300     Expiration Date 323557322025     RBC Leukoreduced RBC LR     Unit Number [id number]     Product Status released     Product Code E0336V00     ISBT CODE 7300     Expiration Date 517616073710     RBC Leukoreduced RBC LR     Unit Number  [id number]     Product Status released     Product Code E0336V00     ISBT CODE 7300     Expiration Date 500938182993    FFP - Product [[id number]] Collected: 05/20/23 2323     Updated: 05/21/23 0725     Plasma Plasma Thawed     Unit Number [id number]     Product Status released     Product Code D7824M35     ISBT CODE 7300     Expiration Date 361443154008     Plasma TH ApheresisPLAS     Unit Number [id number]     Product Status issued     Product Code I4580D98     ISBT CODE 7300     Expiration Date 338250539767     Plasma Plasma Thawed     Unit Number [id number]     Product Status released     Product Code D5329J24     ISBT CODE 7300     Expiration Date 268341962229     Plasma Plasma Thawed     Unit Number [id number]     Product Status released     Product Code C1448J85     ISBT CODE 7300     Expiration Date 631497026378     Plasma Plasma Thawed     Unit Number [id number]     Product Status released     Product Code O6767M09     ISBT CODE 7300     Expiration Date 470962836629     Plasma Plasma Thawed     Unit Number [id number]     Product Status released  Product Code O2703J00     ISBT CODE 7300     Expiration Date 938182993716    FFP - Product, 2 Units [[id number]] Collected: 05/21/23 0238    Specimen: Blood Updated: 05/21/23 0717     Plasma TH ApheresisPLAS     Unit Number [id number]     Product Status issued     Product Code E4235T61     ISBT CODE 7300     Expiration Date 443154008676     Plasma TH ApheresisPLAS     Unit Number [id number]     Product Status released     Product Code Y0998P38     ISBT CODE 7300     Expiration Date 250539767341    Prepare / Crossmatch Red Blood Cells:  One Unit [[id number]] Collected: 05/20/23 2323     Updated: 05/21/23 0717     RBC Leukoreduced RBC LR     Unit Number [id number]     Product Status released     Product Code E0336V00     ISBT CODE 1700     Expiration Date 419622297989     RBC Leukoreduced RBC LR     Unit Number [id number]     Product  Status released     Product Code E0336V00     ISBT CODE 7300     Expiration Date 202501022359     RBC Leukoreduced RBC LR     Unit Number [id number]     Product Status released     Product Code E0336V00     ISBT CODE 1700     Expiration Date 378588502774     RBC Leukoreduced RBC LR     Unit Number [id number]     Product Status released     Product Code E0336V00     ISBT CODE 7300     Expiration Date 709628366294    Prepare Cryoprecipitate [[id number]] Collected: 05/20/23 2323     Updated: 05/21/23 0717     Cryoprecipitate CRYO Pooled     Unit Number [id number]     Product Status released     Product Code E3591V00     ISBT CODE 5100     Expiration Date 174944967591    Urinalysis with Microscopic Exam [[id number]]  (Abnormal) Collected: 05/21/23 0630    Specimen: Urine, Clean Catch Updated: 05/21/23 0646     Urine Color Straw     Urine Clarity Hazy     Urine Specific Gravity 1.050     Urine pH 6.0     Urine Leukocyte Esterase Large     Urine Nitrite Negative     Urine Protein 100= 2+     Urine Glucose Negative     Urine Ketones Negative mg/dL      Urine Urobilinogen Normal mg/dL      Urine Bilirubin Negative     Urine Blood Large     RBC, UA Too numerous to count /hpf      Urine WBC Too numerous to count /hpf      Urine Squamous Epithelial Cells 0-5 /hpf      Urine Mucus Present    Urine [employer] Tube [[id number]] Collected: 05/21/23 0630    Specimen: Urine, Clean Catch Updated: 05/21/23 0637    Culture, Methicillin Resistant Staphylococcus aureus (MRSA) [[id number]] Collected: 05/21/23 0629    Specimen: Swab from Throat Updated: 05/21/23 0637    Culture, Methicillin Resistant Staphylococcus aureus (MRSA) [[id number]] Collected: 05/21/23 0629    Specimen: Swab from Nares Updated: 05/21/23 3545  Calcium, Ionized [[id number]]  (Abnormal) Collected: 05/21/23 0455    Specimen: Blood, Venous Updated: 05/21/23 0555     Calcium, Ionized 2.22 mEq/L     CBC without Differential [[id number]]  (Abnormal) Collected:  05/21/23 0455    Specimen: Blood, Venous Updated: 05/21/23 0550     WBC 10.89 x10 3/uL      Hemoglobin 9.2 g/dL      Hematocrit 78.2 %      Platelet Count 84 x10 3/uL      MPV 9.1 fL      RBC 2.92 x10 6/uL      MCV 93.5 fL      MCH 31.5 pg      MCHC 33.7 g/dL      RDW 14 %      nRBC % 0.0 /100 WBC      Absolute nRBC 0.00 x10 3/uL     Basic Metabolic Panel [[id number]]  (Abnormal) Collected: 05/21/23 0455    Specimen: Blood, Venous Updated: 05/21/23 0545     Glucose 179 mg/dL      BUN 21 mg/dL      Creatinine 1.3 mg/dL      Calcium 7.3 mg/dL      Sodium 578 mEq/L      Potassium 4.4 mEq/L      Chloride 114 mEq/L      CO2 18 mEq/L      Anion Gap 5.0     GFR 51.9 mL/min/1.73 m2     PT/APTT [[id number]]  (Abnormal) Collected: 05/21/23 0455    Specimen: Blood, Venous Updated: 05/21/23 0531     PT 17.8 sec      INR 1.6     PTT 30 sec     Fibrinogen [[id number]]  (Normal) Collected: 05/21/23 0455    Specimen: Blood, Venous Updated: 05/21/23 0531     Fibrinogen 218 mg/dL     Prepare / Crossmatch Red Blood Cells:  One Unit, 1 Units [[id number]] Collected: 05/20/23 2323     Updated: 05/21/23 0422     RBC Leukoreduced RBC LR     Unit Number [id number]     Product Status transfused     Product Code E0336V00     ISBT CODE 7300     Expiration Date 564332951884    Prepare / Crossmatch Red Blood Cells:  One Unit, 1 Units [[id number]] Collected: 05/20/23 2323     Updated: 05/21/23 0238     RBC Leukoreduced RBC LR     Unit Number [id number]     Product Status transfused     Product Code E0336V00     ISBT CODE 7300     Expiration Date 025427062376    Hemoglobin and Hematocrit [[id number]]  (Abnormal) Collected: 05/21/23 0054    Specimen: Blood, Venous Updated: 05/21/23 0059     Hemoglobin 7.7 g/dL      Hematocrit 60.7 %     Lactic Acid [[id number]]  (Normal) Collected: 05/21/23 0021    Specimen: Blood, Venous Updated: 05/21/23 0028     Whole Blood Lactic Acid 1.2 mmol/L     Type and Screen [[id number]] Collected: 05/20/23 2323     Specimen: Blood, Venous Updated: 05/21/23 0015     ABO Rh B Pos     Antibody Screen Negative     Type And Screen Expiration 05/23/2023 23:59    Blood Type Confirmation [[id number]] Collected: 05/20/23 2340    Specimen: Blood, Venous Updated: 05/21/23 0015     Blood Type Confirmation B POS  PT/INR [[id number]]  (Abnormal) Collected: 05/20/23 2313    Specimen: Blood, Venous Updated: 05/20/23 2347     PT 22.6 sec      INR 2.0    APTT [[id number]]  (Normal) Collected: 05/20/23 2313    Specimen: Blood, Venous Updated: 05/20/23 2347     PTT 32 sec     High Sensitivity Troponin-I at 0 hrs [[id number]]  (Normal) Collected: 05/20/23 2313    Specimen: Blood, Venous Updated: 05/20/23 2344     hs Troponin <2.7 ng/L     Culture, Blood, Aerobic And Anaerobic [[id number]] Collected: 05/20/23 2340    Specimen: Blood, Venous Updated: 05/20/23 2343    Comprehensive Metabolic Panel [[id number]]  (Abnormal) Collected: 05/20/23 2313    Specimen: Blood, Venous Updated: 05/20/23 2337     Glucose 133 mg/dL      BUN 23 mg/dL      Creatinine 1.6 mg/dL      Sodium 427 mEq/L      Potassium 4.0 mEq/L      Chloride 108 mEq/L      CO2 22 mEq/L      Calcium 8.5 mg/dL      Anion Gap 9.0     GFR 40.4 mL/min/1.73 m2      AST (SGOT) 19 U/L      ALT 14 U/L      Alkaline Phosphatase 89 U/L      Albumin 2.9 g/dL      Protein, Total 5.4 g/dL      Globulin 2.5 g/dL      Albumin/Globulin Ratio 1.2     Bilirubin, Total 0.5 mg/dL     Lactic Acid [[id number]]  (Abnormal) Collected: 05/20/23 2313    Specimen: Blood, Venous Updated: 05/20/23 2324     Whole Blood Lactic Acid 2.3 mmol/L     CBC with Differential (Order) [[id number]]  (Abnormal) Collected: 05/20/23 2313    Specimen: Blood, Venous Updated: 05/20/23 2324    Narrative:      The following orders were created for panel order CBC with Differential (Order).  Procedure                               Abnormality         Status                     ---------                               -----------         ------                      CBC with Differential (C.Marland KitchenMarland Kitchen[[id number]]  Abnormal            Final result                 Please view results for these tests on the individual orders.    CBC with Differential (Component) [[id number]]  (Abnormal) Collected: 05/20/23 2313    Specimen: Blood, Venous Updated: 05/20/23 2324     WBC 9.57 x10 3/uL      Hemoglobin 9.1 g/dL      Hematocrit 00.9 %      Platelet Count 116 x10 3/uL      MPV 8.7 fL      RBC 2.89 x10 6/uL  MCV 94.8 fL      MCH 31.5 pg      MCHC 33.2 g/dL      RDW 14 %      nRBC % 0.0 /100 WBC      Absolute nRBC 0.00 x10 3/uL      Preliminary Absolute Neutrophil Count 6.89 x10 3/uL      Neutrophils % 72.1 %      Lymphocytes % 14.8 %      Monocytes % 7.1 %      Eosinophils % 5.1 %      Basophils % 0.4 %      Immature Granulocytes % 0.5 %      Absolute Neutrophils 6.89 x10 3/uL      Absolute Lymphocytes 1.42 x10 3/uL      Absolute Monocytes 0.68 x10 3/uL      Absolute Eosinophils 0.49 x10 3/uL      Absolute Basophils 0.04 x10 3/uL      Absolute Immature Granulocytes 0.05 x10 3/uL                Elease Etienne, NP    [facility]  [facility], Division of Vascular & Interventional Radiology  [facility]- [phone]  [facility] - [phone]  IF[facility]--3570 IL[facility]--8022 IFOH--3069  After Hours Service [phone]         I have evaluated the patient and agree with the above documented evaluation and plan.    Signed by: Verlee Rossetti, MD    [facility]  [facility], Division of Vascular & Interventional Radiology  [phone]  [facility] - [phone]  IF[facility]--3570 ILH--8022 IFOH--3069  After Hours Service [phone]

## 2023-05-21 NOTE — Progress Notes (Signed)
ASA Classification:   []    ASA 1  Healthy patient  []    ASA 2  Mild systemic illness  [x]    ASA 3  Systemic disease, though not incapacitating  []    ASA 4  Severe systemic disease that is a constant threat to life   []    ASA 5  Moribund condition, patient unexpected to live >24 hours, irrespective of                   procedure  []    E         Emergent procedure    Mallampati Score:   []  1  [x]  2  []  3  []  4  []  Intubated  []  Tracheostomy    Planned Anesthesia:   []  No sedation  []  Local  [x]  Moderate sedation  []  Deep sedation (with Anesthesiology present)  []  General Anesthesia     Airway Assesment:   [x]  Normal  [] Compromised    The patient is medically stable and appropriate to undergo the planned procedure.  Procedure is deemed an emergency.    Diagnosis: Lower GI bleed with hemodynamic instability  Procedure: Visceral arteriography and possible embolization    Signed by: Verlee Rossetti, MD    [facility]  [facility], Division of Vascular & Interventional Radiology  [facility]- [phone]  [facility] - [phone]  [facility]--3570 IL[facility]--8022 IFOH--3069  After Hours Service [phone]

## 2023-05-21 NOTE — Nursing Progress Note (Signed)
4 eyes in 4 hours pressure injury assessment note:       Completed with: Caryn Bee, RN                                                                                                               Bony Prominences: Check appropriate box; if wound is present enter wound assessment in LDA                Occiput:                 [x] WNL  []  Wound present  Face:                     [] WNL  []  Wound present Dry lips  Ears:                      [x] WNL  []  Wound present  Spine:                    [x] WNL  []  Wound present  Shoulders:             [x] WNL  []  Wound present  Elbows:                  [] WNL  []  Wound present Dry/flaky and with multiple small skin tear at both UE  Sacrum/coccyx:     [] WNL  []  Wound present Blanchable redness  Ischial Tuberosity:  [x] WNL  []  Wound present  Trochanter/Hip:      [] WNL  []  Wound present Dry/flaky and with multiple small skin tear at both LE  Knees:                   [x] WNL  []  Wound present  Ankles:                   [x] WNL  []  Wound present  Heels:                    [x] WNL  []  Wound present  Other pressure areas:          []  Wound location Surgical puncture wound at inguinal area        Device related: []  Device name:           LDA completed if wound present: yes/no  Consult WOCN if necessary     Other skin related issues, ie tears, rash, etc, document in Integumentary flowsheet

## 2023-05-22 DIAGNOSIS — Z5189 Encounter for other specified aftercare: Secondary | ICD-10-CM

## 2023-05-22 DIAGNOSIS — R579 Shock, unspecified: Secondary | ICD-10-CM

## 2023-05-22 LAB — BASIC METABOLIC PANEL
Anion Gap: 5 (ref 5.0–15.0)
BUN: 14 mg/dL (ref 9–28)
CO2: 21 meq/L (ref 17–29)
Calcium: 8 mg/dL (ref 7.9–10.2)
Chloride: 113 meq/L — ABNORMAL HIGH (ref 99–111)
Creatinine: 1.1 mg/dL (ref 0.5–1.5)
GFR: >60 mL/min/{1.73_m2} (ref >=60.0)
Glucose: 129 mg/dL — ABNORMAL HIGH (ref 70–100)
Potassium: 3.7 meq/L (ref 3.5–5.3)
Sodium: 139 meq/L (ref 135–145)

## 2023-05-22 LAB — CBC
Absolute nRBC: 0 10*3/uL (ref <=0.00)
Absolute nRBC: 0 10*3/uL (ref <=0.00)
Absolute nRBC: 0 10*3/uL (ref <=0.00)
Hematocrit: 25.8 % — ABNORMAL LOW (ref 37.6–49.6)
Hematocrit: 28 % — ABNORMAL LOW (ref 37.6–49.6)
Hematocrit: 28.1 % — ABNORMAL LOW (ref 37.6–49.6)
Hemoglobin: 8.8 g/dL — ABNORMAL LOW (ref 12.5–17.1)
Hemoglobin: 9.8 g/dL — ABNORMAL LOW (ref 12.5–17.1)
Hemoglobin: 9.6 g/dL — ABNORMAL LOW (ref 12.5–17.1)
MCH: 31 pg (ref 25.1–33.5)
MCH: 31.8 pg (ref 25.1–33.5)
MCH: 30.9 pg (ref 25.1–33.5)
MCHC: 34.1 g/dL (ref 31.5–35.8)
MCHC: 35 g/dL (ref 31.5–35.8)
MCHC: 34.2 g/dL (ref 31.5–35.8)
MCV: 90.8 fL (ref 78.0–96.0)
MCV: 90.9 fL (ref 78.0–96.0)
MCV: 90.4 fL (ref 78.0–96.0)
MPV: 8.7 fL — ABNORMAL LOW (ref 8.9–12.5)
MPV: 9.2 fL (ref 8.9–12.5)
MPV: 9.1 fL (ref 8.9–12.5)
Platelet Count: 68 10*3/uL — ABNORMAL LOW (ref 142–346)
Platelet Count: 82 10*3/uL — ABNORMAL LOW (ref 142–346)
Platelet Count: 88 10*3/uL — ABNORMAL LOW (ref 142–346)
RBC: 2.84 10*6/uL — ABNORMAL LOW (ref 4.20–5.90)
RBC: 3.08 10*6/uL — ABNORMAL LOW (ref 4.20–5.90)
RBC: 3.11 10*6/uL — ABNORMAL LOW (ref 4.20–5.90)
RDW: 14 % (ref 11–15)
RDW: 14 % (ref 11–15)
RDW: 14 % (ref 11–15)
WBC: 15.87 10*3/uL — ABNORMAL HIGH (ref 3.10–9.50)
WBC: 16.41 10*3/uL — ABNORMAL HIGH (ref 3.10–9.50)
WBC: 18.71 10*3/uL — ABNORMAL HIGH (ref 3.10–9.50)
nRBC %: 0 /100{WBCs} (ref <=0.0)
nRBC %: 0 /100{WBCs} (ref <=0.0)
nRBC %: 0 /100{WBCs} (ref <=0.0)

## 2023-05-22 LAB — CULTURE, METHICILLIN RESISTANT STAPHYLOCOCCUS AUREUS (MRSA)

## 2023-05-22 LAB — ECG 12-LEAD
Atrial Rate: 69 {beats}/min
IHS MUSE NARRATIVE AND IMPRESSION: NORMAL
P Axis: 35 degrees
P-R Interval: 170 ms
Q-T Interval: 442 ms
QRS Duration: 116 ms
QTC Calculation (Bezet): 473 ms
R Axis: -74 degrees
T Axis: 3 degrees
Ventricular Rate: 69 {beats}/min

## 2023-05-22 LAB — CULTURE, URINE: Culture Urine: NORMAL

## 2023-05-22 LAB — MAGNESIUM: Magnesium: 1.4 mg/dL — ABNORMAL LOW (ref 1.6–2.6)

## 2023-05-22 LAB — PT AND APTT
INR: 1.4 (ref 0.9–1.1)
PT: 15.3 s — ABNORMAL HIGH (ref 10.1–12.9)
PTT: 27 s (ref 27–39)

## 2023-05-22 LAB — FIBRINOGEN: Fibrinogen: 306 mg/dL (ref 181–413)

## 2023-05-22 LAB — LACTIC ACID: Whole Blood Lactic Acid: 1.3 mmol/L (ref 0.2–2.0)

## 2023-05-22 MED ORDER — MAGNESIUM SULFATE IN D5W 1-5 GM/100ML-% IV SOLN
1.0000 g | INTRAVENOUS | Status: AC
Start: 2023-05-22 — End: 2023-05-22
  Administered 2023-05-22 (×2): 1 g via INTRAVENOUS
  Filled 2023-05-22: qty 100

## 2023-05-22 MED ORDER — QUETIAPINE FUMARATE 25 MG PO TABS
100.0000 mg | ORAL_TABLET | Freq: Every evening | ORAL | Status: DC
Start: 2023-05-22 — End: 2023-05-23
  Administered 2023-05-22: 100 mg via ORAL
  Filled 2023-05-22: qty 4

## 2023-05-22 MED ORDER — QUETIAPINE FUMARATE 25 MG PO TABS
100.0000 mg | ORAL_TABLET | Freq: Two times a day (BID) | ORAL | Status: DC
Start: 2023-05-22 — End: 2023-05-22

## 2023-05-22 MED ORDER — MAGNESIUM SULFATE IN D5W 1-5 GM/100ML-% IV SOLN
1.0000 g | INTRAVENOUS | Status: AC | PRN
Start: 2023-05-22 — End: ?
  Administered 2023-05-23 – 2023-05-25 (×4): 1 g via INTRAVENOUS
  Filled 2023-05-22 (×4): qty 100

## 2023-05-22 MED ORDER — QUETIAPINE FUMARATE 25 MG PO TABS
50.0000 mg | ORAL_TABLET | Freq: Every day | ORAL | Status: DC
Start: 2023-05-22 — End: 2023-05-24
  Administered 2023-05-22 – 2023-05-24 (×3): 50 mg via ORAL
  Filled 2023-05-22 (×3): qty 2

## 2023-05-22 MED ORDER — POTASSIUM & SODIUM PHOSPHATES 280-160-250 MG PO PACK
2.0000 | PACK | ORAL | Status: DC | PRN
Start: 2023-05-22 — End: 2023-05-28

## 2023-05-22 MED ORDER — MELATONIN 3 MG PO TABS
6.0000 mg | ORAL_TABLET | Freq: Every evening | ORAL | Status: DC
Start: 2023-05-22 — End: 2023-05-25
  Administered 2023-05-22 – 2023-05-24 (×3): 6 mg via ORAL
  Filled 2023-05-22 (×3): qty 2

## 2023-05-22 MED ORDER — MIDODRINE HCL 5 MG PO TABS
10.0000 mg | ORAL_TABLET | Freq: Three times a day (TID) | ORAL | Status: DC
Start: 2023-05-22 — End: 2023-05-22
  Administered 2023-05-22 (×2): 10 mg via ORAL
  Filled 2023-05-22 (×2): qty 2

## 2023-05-22 MED ORDER — POTASSIUM CHLORIDE CRYS ER 20 MEQ PO TBCR
0.0000 meq | EXTENDED_RELEASE_TABLET | ORAL | Status: AC | PRN
Start: 2023-05-22 — End: ?

## 2023-05-22 MED ORDER — POTASSIUM CHLORIDE 20 MEQ PO PACK
0.0000 meq | PACK | ORAL | Status: DC | PRN
Start: 2023-05-22 — End: 2023-05-28

## 2023-05-22 MED ORDER — PANTOPRAZOLE SODIUM 40 MG IV SOLR
40.0000 mg | Freq: Two times a day (BID) | INTRAVENOUS | Status: DC
Start: 2023-05-22 — End: 2023-05-25
  Administered 2023-05-22 – 2023-05-24 (×6): 40 mg via INTRAVENOUS
  Filled 2023-05-22 (×6): qty 40

## 2023-05-22 MED ORDER — POTASSIUM CHLORIDE 20 MEQ PO PACK
40.0000 meq | PACK | Freq: Once | ORAL | Status: AC
Start: 2023-05-22 — End: 2023-05-22
  Administered 2023-05-22: 40 meq via ORAL
  Filled 2023-05-22: qty 2

## 2023-05-22 MED ORDER — LACTATED RINGERS IV BOLUS
1000.0000 mL | Freq: Once | INTRAVENOUS | Status: AC
Start: 2023-05-22 — End: 2023-05-22
  Administered 2023-05-22: 1000 mL via INTRAVENOUS

## 2023-05-22 MED ORDER — POTASSIUM CHLORIDE 10 MEQ/100ML IV SOLN (WRAP)
10.0000 meq | INTRAVENOUS | Status: DC | PRN
Start: 2023-05-22 — End: 2023-05-28

## 2023-05-22 NOTE — Progress Notes (Signed)
[facility]  Progress Note      Patient Name: Dillon Wood  MRN: [id number]  Room: A2915/A2915-01    Reason for Admission: GI bleed  ICU Day: 1d 7h      Overview   Dillon Wood is a 89 y.o. 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  No history of GI bleeds in the past.  In ED patient was afebrile, heart rate 90, blood pressure 75/48, satting 96% on room air with no respiratory distress, labs notable for hemoglobin 9.1, platelets 116, creatinine 1.6, lactate 2.3, INR 2.0, PTT 22.  Electrolytes, LFTs, and troponin within normal limits.  While in the ED patient had large-volume BRBPR with significant clot burden, repeat CBC with hemoglobin 7.7.  CXR with patchy airspace opacities, CT head negative for acute intracranial findings.  CTA abdomen pelvis with active hemorrhage in the sigmoid colon likely due to diverticulosis.  In ED patient received 2 L IVF, Kcentra for apixaban reversal, IV TXA, and 2 units PRBC with improvement in blood pressure, lactate normalized. Patient went emergently to IR with Dr. Excell Seltzer, who noted no active extravasation noted. Empiric Gelfoam embolization of sigmoid branches of the IMA performed. Single coil placed in the superior rectal artery as well. Hypotensive in IR suite. MTP activated. Admitted to ICU postprocedure.      While in the ICU, patient was hypotensive requiring Levophed and was agitated, required Precedex which was weaned off and then restarted.  Patient had CBCs which were stable with no reported bloody bowel movements.    Subjective   Hospital Course:  11/27: Patient seen and examined at bedside.  Patient on Precedex which is currently weaned off.  Patient somewhat confused but somewhat responsive to commands.  NG tube placed  overnight to give antipsychotics given agitation and midodrine for hypotension.  There was a report of bloody bowel movement overnight which appeared dark suspect to be old     Objective   Physical Examination     Last vent settings if on vent:       Vitals Temp:  [96.6 F (35.9 C)-97.4 F (36.3 C)]   Heart Rate:  [53-121]   Resp Rate:  [12-32]   BP: (75-125)/(35-67)   SpO2:  [87 %-100 %]   Weight:  [71.4 kg (157 lb 6.5 oz)]   BMI (calculated):  [23.2]  // Temperature with 24 range    Input / Output:   Intake/Output Summary (Last 24 hours) at 05/22/2023 1142  Last data filed at 05/22/2023 0700  Gross per 24 hour   Intake 622.34 ml   Output 1575 ml   Net -952.66 ml       Neuro:    alert, intermittently oriented to self, able to answer some questions, moving all 4 extremities      Lungs:   Breathing comfortably on room air, no wheezing or rhonchi noted     Cardiac:   Regular rate and rhythm no murmur noted     Abdomen:    Soft and non tender         Extremities:   no pedal edema     Skin:     Cool          Assessment     Active Problems:   #  Recent Labs     05/22/23  0305 05/21/23  2223 05/21/23  2004 05/21/23  1533 05/21/23  0905 05/21/23  0455 05/20/23  2313   Platelet Count 68* 70* 73* 69* 75* 84* 116*     Diagnosis: Moderate Thrombocytopenia             Recent Labs   Lab 05/22/23  0305 05/21/23  2223 05/21/23  2004   Hemoglobin 8.8* 9.3* 9.1*   Hematocrit 25.8* 27.3* 27.0*   MCV 90.8 91.6 90.6   WBC 15.87* 15.39* 13.12*   Platelet Count 68* 70* 73*         Anemia Diagnosis: Acute: Acute Blood Loss Anemia         Neuro   History of dementia  Acute metabolic encephalopathy     Cardiovascular   Hemorrhagic shock  PVD status post stent     Renal  Chronic kidney disease     Hematology  Acute blood loss anemia  Lower GI bleed due to sigmoid diverticulosis  History of LLE DVT on apixaban  History of squamous cell carcinoma    Plan     NEUROLOGICAL:   # History of dementia  # Acute metabolic encephalopathy,  secondary to shock, ICU delirium, UTI, history of sundowning in the setting of dementia  - Delirium precautions, CAM-ICU monitoring  - Pain control with as needed Tylenol  - PRN IV haldol   - Precedex for procedure, wean off as able for RAS goal 0 to -1   -Since patient cannot take oral medications, NG tube was placed and patient was given high doses of antipsychotics including 50 mg Seroquel a.m., Seroquel at nighttime 100 mg, twice daily Zyprexa, EKG reviewed with QTc 473  -PT OT     CARDIOVASCULAR:   # Shock multifactorial, secondary hemorrhagic, hypovolemic and likely component of sedation such as Precedex  # History of peripheral vascular disease s/p stent  - MAP goal > 65   - Volume resuscitation 2u PRBC, 2u FFP and 2L IVF, received additional 2 L yesterday, cautious with aggressive fluids  -Wean off Levophed, midodrine added,  - Continuous telemetry monitoring   - Hold home aspirin and statin in acute setting     PULMONARY:   - Supplemental oxygen as needed to keep sat > 92%  -Wean off oxygen as tolerated     RENAL:   # CKD  Creatinine improved  - Trend Renal function  - Replace electrolytes as necessary  -Lactic acid unimpressive, 'd lactate trending  - Incidental finding of abnormal thickening of the urinary bladder, consider outpatient urology follow-up for further management/evaluation, concern for possible bladder neoplasm.     GASTROINTESTINAL:  # Acute Lower GI Bleed s/p empiric sigmoid branches of the IMA and single coil placed in the superior rectal artery  # Sigmoid Diverticulosis   - IR following, GI following  -CBC has been holding, episode of bloody vomiting overnight suspected to be old blood  -Protonix 40 twice daily, trend CBC Q8  - Nutrition: NG tube placed, trickle feeds ordered  - Bowel Regimen hold in acute setting   - GI ppx: As above     INFECTIOUS DISEASE:   -Low suspicion of septic shock or severe sepsis, however does have a UTI  -Continue empiric antibiotic with Rocephin and Flagyl,  Flagyl to cover for any intra-abdominal pathology as patient has abdominal discomfort given concern for possible colitis     HEMATOLOGY/ONCOLOGY:   # Acute blood loss anemia  #  History of LLE DVT in 2022 on apixaban  - Trend CBC   - VTE ppx: SCDs only     ENDOCRINOLOGY/RHEUMATOLOGY:   - Hypoglycemia protocol      Advance Care Planning: Open ACP Navigator  Code Status: NO CPR - SUPPORT OK  Next of Kin:  Primary Emergency Contact: Goodpasture,Murray           ADVANCE CARE PLAN                 This patient is critically ill with life-threatening condition(s) and a high probability of sudden clinically significant deterioration due to the condition(s) noted in the assessment and plan, which requires the highest level of physician/advance practice provider preparedness to intervene urgently. Full attention to the direct care of this patient was provided for the period of time noted below. Any critical care time performed today is exclusive of teaching and billable procedures and not overlapping with any other physicians or advance practice providers.    I have personally assessed the patient and based my assessment and medical decision-making on a review of the patient's history and 24-hour interval events along with medical records, physical examination, vital signs, analysis of recent laboratory results, evaluation of radiology images, monitoring data for potential decompensation, and additional findings found in detail within ICU team notes. The findings and plan of care was discussed with the care team.    Total critical care time: 60 minutes during this encounter.    Cline Cools, MD   05/22/2023 11:52 AM

## 2023-05-22 NOTE — Progress Notes (Signed)
PROGRESS NOTE    Date Time: 05/22/23 11:20 AM  Patient Name: Dillon Wood, Dillon Wood      Assessment:   89 year old male presented with lower GIB with hemodynamic instability.    S/p arteriogram w/ embolization 05/21/2023.  Right groin op-site stable.  H/H stable. Bedside RN reports bloody BM x 1 this morning.  Currently VSS, afebrile.  Was hypotensive overnight, on pressor support, transitioned to oral midodrine this morning.    Plan:   Continue to monitor right groin op-site for bleeding.  May remove groin dressing tomorrow morning and leave open to air.  No further follow-up planned with CV IR at this time. Contact [facility] IR with changes for reevaluation.  Medical management per primary medicine team and consulting specialist.    Discussed with family, nursing staff  Subjective:     Patient currently resting in bed awake and aware, following some commands today.  Family at bedside visiting.    Physical Exam:     Vitals:    05/22/23 0900   BP: 112/67   Pulse: 81   Resp: (!) 23   Temp:    SpO2: 98%     Temp (24hrs), Avg:97 F (36.1 C), Min:96.6 F (35.9 C), Max:97.4 F (36.3 C)    Intake and Output Summary (Last 24 hours) at Date Time    Intake/Output Summary (Last 24 hours) at 05/22/2023 1120  Last data filed at 05/22/2023 0700  Gross per 24 hour   Intake 622.34 ml   Output 1575 ml   Net -952.66 ml       APPEARANCE: Ill appearing elderly male in NAD  NEURO: A+ O x 1, following commands  LUNGS: Normal respiratory effort  ABDOMEN: Soft, NT, ND  PUNCTURE SITE: Right femoral access site dressed CDI, site soft, flat, no ecchymosis, hematoma, swelling or induration.  EXTREMITIES: Warm becoming mildly cooler at feet bilaterally, palpable DP pulses bilaterally, no edema bilaterally        Labs:     Recent Labs   Lab 05/22/23  0305 05/21/23  2223   WBC 15.87* 15.39*   Hemoglobin 8.8* 9.3*   Hematocrit 25.8* 27.3*   Platelet Count 68* 70*       Recent Labs   Lab 05/22/23  0305 05/21/23  0455   Sodium 139 137   Potassium  3.7 4.4   Chloride 113* 114*   CO2 21 18   BUN 14 21   Creatinine 1.1 1.3   Glucose 129* 179*       Recent Labs   Lab 05/20/23  2313   AST (SGOT) 19   ALT 14   Alkaline Phosphatase 89   Protein, Total 5.4*   Albumin 2.9*   Bilirubin, Total 0.5       Recent Labs   Lab 05/22/23  0305 05/21/23  2004   PTT 27 28   PT 15.3* 15.1*   INR 1.4 1.3         Rads:     Embolization Arterial General [[id number]] (Order [id number])  Status: Final result     Physicians    Panel Physicians Referring Physician Case Authorizing Physician   Verlee Rossetti, MD (Primary)  Verlee Rossetti, MD     Procedures    EMBOLIZATION ARTERIAL GENERAL     Study Result    Narrative & Impression   Procedure: Inferior mesenteric arteriogram, sigmoid artery arteriogram,  superior rectal artery arteriogram, sigmoid artery Gelfoam embolization and  superior rectal artery coil embolization with ultrasound-guided  arterial  access     History: Patient is a 89 year old male with acute lower GI bleed and  hemodynamic instability with CTA revealing acute arterial extravasation  within the sigmoid colon. He presents for emergent arteriogram and  embolization     Operator(s): Genene Churn. Excell Seltzer, M.D.  Advanced Practice Provider: None     Contrast:  Contrast agent: Visipaque 320  Contrast volume (mL): 50     Radiation Parameters:  Fluoroscopy time (minutes): 15.6    Reference air kerma (mGy): 208.1      Consent: Informed consent for the procedure including risks, benefits and  alternatives was obtained. A safety timeout was performed and documented  with all members of the procedure team present, including verification of  patient identification, correct procedure, procedure site, and laterality.  The patient was positioned supine.      Preparation: The site was prepared and draped using all elements of maximal  sterile barrier technique including sterile gloves, sterile gown, cap,  mask, large sterile sheet, sterile ultrasound probe cover, sterile  ultrasound gel, hand  hygiene and cutaneous antisepsis with 2%  chlorhexidine.  Medical reason for site preparation exception: Not applicable.     Anesthesia: Moderate sedation with Fentanyl and Versed was administered  with appropriate physiologic monitoring using an independent trained  observer. Sedation was administered under direct physician supervision for  64 minutes. 1% lidocaine local was administered as well.     Sedation Start Time: 0300  Sedation End Time: 0404  Fentanyl Dosage:  200 mcg  Versed Dosage:   2.5 mg     Technique:      Patient's right groin was prepped and draped in usual sterile fashion.  Local anesthesia was administered into the subcutaneous tissues. Using  direct ultrasound guidance and a 21-gauge micropuncture needle, the right  common femoral artery was catheterized. A 0.018-in. Guidewire was advanced  centrally. The needle was removed and a 5 [place] micropuncture catheter was  advanced over the guidewire. The guidewire and transitional dilator were  removed. A 0.035-in. Guidewire was advanced into the abdominal aorta. The  micropuncture catheter was removed and a 6 [place] vascular sheath was  advanced into the right external iliac artery and attached to constant  hemodynamic monitoring.     A 5 French Omni 2 selective catheter was advanced into the inferior  mesenteric artery. Inferior mesenteric arteriogram was performed.     In coaxial fashion, a 0.021 inch microcatheter was advanced to the sigmoid  artery. Sigmoid arteriogram was performed. Subselective catheterization of  the superior and inferior branches of the sigmoid artery was performed.  Empiric Gelfoam embolization of these vessels was performed.     Subsequent, a 0.028 inch microcatheter was coaxially introduced into the  superior rectal artery. Superior rectal arteriogram was performed. Empiric  coil embolization was performed of this vessel.      The catheter and sheath were removed and hemostasis was achieved with  Angio-Seal vascular  closure device..     Findings:     Direct ultrasound guidance was used to gain access to the right common  femoral artery. The right common femoral artery is patent. An ultrasound  image of the needle within the artery was obtained and placed in the  permanent medical record.      Superior mesenteric arteriogram reveals a normal branching pattern of the  vessel. Portal vein is imaging was unremarkable. No extravasation of  contrast was noted. Given patient's continued hemodynamic instability and  the CT findings,  empiric Gelfoam embolization of the sigmoid artery and its  branches was performed. Superior rectal arteriogram was unremarkable  without evidence of extravasation. Empiric coil embolization of the mid  superior rectal artery was performed as well.     All indicated images were saved to PACS.     IMPRESSION:      1. No active bleeding identified.  2. Empiric Gelfoam embolization of the sigmoid artery and its branches.  3. Empiric coil embolization of the superior rectal artery.     Larrie Kass, MD  05/21/2023 11:15 AM       Marcha Dutton, NP  [facility] Department  [facility]  [phone]

## 2023-05-22 NOTE — Progress Notes (Signed)
4 eyes in 4 hours pressure injury assessment note:      Completed with:   Unit & Time admitted:              Bony Prominences: Check appropriate box; if wound is present enter wound assessment in LDA     Occiput:                 [x] WNL  []  Wound present  Face:                     [x] WNL  []  Wound present  Ears:                      [x] WNL  []  Wound present  Spine:                    [x] WNL  []  Wound present  Shoulders:             [x] WNL  []  Wound present  Elbows:                  [x] WNL  []  Wound present  Sacrum/coccyx:     [] WNL  [x]  Wound present - blanchable redness  Ischial Tuberosity:  [x] WNL  []  Wound present  Trochanter/Hip:      [] WNL  [x]  Wound present - skin tears   Knees:                   [x] WNL  []  Wound present  Ankles:                   [x] WNL  []  Wound present  Heels:                    [x] WNL  []  Wound present  Other pressure areas:  [x]  Wound location R groin px site       Device related: []  Device name:         LDA completed if wound present: yes/no  Consult WOCN if necessary    Other skin related issues, ie tears, rash, etc, document in Integumentary flowsheet

## 2023-05-22 NOTE — Progress Notes (Signed)
Nutritional Support Services  Nutrition Assessment    Dillon Wood 89 y.o. male   MRN: [id number]    Summary of Nutrition Recommendations:    Trickle TF of Promote @ 92ml/h, monitor bowel function and tolerance closely. Provides 240kcal, 15g protein.   If to advance TF to meet goal needs, recommend Promote @ 6ml/h which provides 1800kcal, 112g protein, and free water. Minimum FWF of 30ml q4h  Continue to follow SLP eval  Recommend ongoing discussion for GOC and use of artificial nutrition in setting of dementia  Monitor wt trends  Monitor electrolytes and replace as needed    -----------------------------------------------------------------------------------------------------------------                                                        Assessment Data:   Referral Source: MD verbal consult  Reason for Referral: TF    Nutrition:   Pt seen at bedside, ongoing agitation/confusion. No family in room, + sitter. +NGT in place for medication and concern for safety of swallow. SLP following (11/26) recommended  mince moist, thing liquids. Plan for trickle TF at this time as pt also with possible GIB, currently stable per MDR. Current TF for short term provision until AMS improved and per SLP recommendations. K+ and Mg replacement given.     As pt with history of progressive dementia Palliative care note recommending artificial nutrition. Discussed with attending, given anticipated short term need for TF, okay with NGT at this time but aware it is not a long term solution. Recommend continue to follow SLP recommendations and set time trial for NGT feeds (max of 2 weeks recommended).     According to the position paper by the American Society of Parental and Enteral Nutrition entitled Ethical Aspects of Artificially Administered Nutrition and Hydration: An ASPEN Position Paper released in January 2021:    Enteral Nutrition (EN) neither stops dementia disease progression nor prevents imminent death.  Each  decision for feeding tube placement in individuals with Advanced Dementia should be made on a case-by-case basis and involve an interdisciplinary team.  Careful considerations of the benefits-harm ratio should be discussed and checked with family and/or surrogate to determine whether they would be consistent with the preferences of a person with dementia.    Risks and Burdens   Aspiration  Cellulitus, stomal inflammation, excoriation at tube site  Deprived pleasure of eating  Diarrhea  Fluid overload  Gastrointestinal and venous distention  Increased use of pharmacological sedation  Increased use of restraints  Nausea and vomiting  Negative impact on quality of life through decreased social interaction at mealtime or decreased delivery of attention required during assisted oral feeding  Not associated with improved survival based on feeding tube insertion timing, relative to onset of eating problems  Peritonitis  Tube occlusion    Benefits  Artificial nutrition and hydration may help to prevent more serious debilitating conditions, when used at the early signs of malnutrition  Decreased distress in patients who experience coughing and choking with oral intake  Preservation of ability to perform simple instrumental daily life activities  Relief of dehydration  Weight maintenance    Benefits of careful hand-to-mouth feeding  Familiarity of food  Stimulates the sense of taste  Increases the level of interaction, communication and engagement between the patient and their family members/caretakers  Learning Needs: not appropriate    Hospital Admission:  89 y.o. male p/w painless hematochezia in the setting of eliquis use, weakness, fall found to have acute blood loss anemia w/ assoc hemorrhagic shock, +CTA for bleeding in the sigmoid s/p emergent embolization by IR earlier this morning 11/26.     Medical Hx:  has a past medical history of Closed fracture of left side of maxilla (04/03/2017), DVT (deep venous  thrombosis), Facial contusion, initial encounter (04/03/2017), Gastroesophageal reflux disease, Hyperlipemia, Left orbit fracture, closed, initial encounter (04/03/2017), and PVD (peripheral vascular disease).    PSH: has a past surgical history that includes Abdominal surgery.     Orders Placed This Encounter   Procedures    Diet NPO effective now    Tube feeding-Continuous   N/a    ANTHROPOMETRIC  Height: 175.3 cm (5\' 9" )  Weight: 71.4 kg (157 lb 6.5 oz)  IBW/kg (Calculated) Male: 72.75 kg  Body mass index is 23.25 kg/m.     Weight History Summary:    Weight Monitoring     Weight Weight Method   04/02/2017 77.111 kg  Stated    04/03/2017 77.111 kg  Stated    02/10/2021 72.576 kg     04/25/2021 72.576 kg     09/22/2021 78.926 kg     01/05/2022 75.751 kg     07/16/2022 76.522 kg     07/23/2022 73.846 kg     08/14/2022 74.208 kg     12/04/2022 74.844 kg     04/01/2023 72.122 kg     05/20/2023 70.67 kg  Stated    05/22/2023 71.4 kg  Bed Scale    6.7% wt loss in past 10 months, not clinically significant    ESTIMATED NEEDS  Total Daily Energy Needs: 1785 to 2142 kcal  Method for Calculating Energy Needs: 25 kcal - 30 kcal per kg  at 71.4 kg (Actual body weight)  Rationale: non vent, BMI     Total Daily Protein Needs: 85.68 to 107.1 g  Method for Calculating Protein Needs: 1.2 g - 1.5 g per kg at 71.4 kg (Actual body weight)  Rationale: non vent, BMI    Total Daily Fluid Needs: 1785 to 2142 ml  Method for Calculating Fluid Needs: 1 ml per kcal energy = 1785 to 2142 kcal  Rationale: or per team    Pertinent Medications:  Current Facility-Administered Medications   Medication Dose Route Frequency    atorvastatin  40 mg Oral Daily    cefTRIAXone  1 g Intravenous Q24H    lidocaine  1 patch Transdermal Q24H    melatonin  6 mg Oral QHS    metroNIDAZOLE  500 mg Intravenous Q8H    midodrine  10 mg Oral Q8H SCH    OLANZapine  2.5 mg Oral Q12H SCH    ondansetron  4 mg Intravenous Once    QUEtiapine  100 mg Oral QHS    QUEtiapine  50 mg  Oral Daily    senna-docusate  1 tablet Oral Q12H [facility]    tamsulosin  0.4 mg Oral Daily after dinner      dexmedeTOMIDine Stopped (05/22/23 0805)    norepinephrine Stopped (05/22/23 0800)     PRN meds given in the past 48 hours: haldol    Pertinent labs:  Recent Labs   Lab 05/22/23  0305 05/21/23  0455 05/20/23  2313   Sodium 139 137 139   Potassium 3.7 4.4 4.0   Chloride 113* 114* 108   CO2  21 18 22    BUN 14 21 23    Creatinine 1.1 1.3 1.6*   Glucose 129* 179* 133*   Calcium 8.0 7.3* 8.5   Magnesium 1.4*  --   --    GFR >60.0 51.9* 40.4*   Bilirubin, Total  --   --  0.5   AST (SGOT)  --   --  19   ALT  --   --  14   Alkaline Phosphatase  --   --  89   Whole Blood Lactic Acid 1.3  --  2.3*     Recent Labs   Lab 05/21/23  1941   Whole Blood Glucose POCT 129*     Allergies[1]    Physical Assessment: 11/27  Unable to complete given agitation and pt moving too much  Edema: no sign of fluid accumulation per flowsheet  Skin: WDL  GI function: WDL                                                              Nutrition Diagnosis      Inadequate Protein-energy intake related to dementia/agitation as evidenced by NPO status and need for TF regimen- new    Pt is at low threshold for malnutrition; will continue to monitor for 2 qualifying criteria.                                                              Intervention     Nutrition recommendation - Please refer to top of note                                                             Monitoring/Evaluation     Goals:   Patient to meet >75% of estimated needs through PO or Nutrition Support on f/u- new    Nutrition Risk Level: High (will follow up at least 2 times per week and PRN)     Olive Zmuda S. Allena Katz MS, RD, CNSC  Clinical Dietitian         [1]   Allergies  Allergen Reactions    Penicillins      Disorientation- per patient, no allergy as of 03/7023

## 2023-05-22 NOTE — Progress Notes (Signed)
Initial Case Management Assessment and Discharge Planning [facility]   Patient Name: Dillon Wood, Dillon Wood   Date of Birth Feb 13, 1929   Attending Physician: Cline Cools, MD   Primary Care Physician: Pcp, None, MD   Length of Stay 1   Reason for Consult / Chief Complaint IDPA        Situation   Admission DX:   1. GI bleed    2. Acute blood loss anemia        A/O Status: Unable to Assess    LACE Score: 9    Patient admitted from: ER  Admission Status: inpatient    Health Care Agent: Child  Name: Dillon Wood  Phone number: [phone]   Background     Advanced directive:   <no information>      Code Status:   NO CPR - SUPPORT OK     Residence: Assisted living    PCP: PCP None, MD  Patient Contact:   [phone] (home) [phone] (work)    [phone] (mobile)     Emergency contact:   Extended Emergency Contact Information  Primary Emergency Contact: Dillon Wood [place] Phone: [phone]  Relation: Son  Secondary Emergency Contact: Dillon Wood [place] of [place]  Mobile Phone: [phone]  Relation: Daughter      ADL/IADL's: Independent and Assistive Device  Previous Level of function: 7 Independent     DME: Single point Administrator, [employer] (4 wheeled walker)    Pharmacy:     [facility] - [place], [place] - [address]  [address]  [place] [address]  Phone: [phone] Fax: [phone]      Prescription Coverage: Yes    Home Health: The patient is not currently receiving home health services.    Previous SNF/AR: none    COVID Vaccine Status: unknown    Date First IMM given: pending  UAI on file?: No  Transport for discharge? TBD  Agreeable to Assisted Living Facility post-discharge:  Yes     Assessment   Cm assessment done with pt's daughter via phone. FS information verified. Pt lives at ALF; independent with ADLs; DMEs as listed above available to the pt. Cm to follow up on care team recommendations.  BARRIERS TO DISCHARGE:  Medical stability   Recommendation   D/C Plan A: Assisted Living Facility  D/C Plan B: SNF    Dillon Mccoy RN BSN CM I  [place]  [phone]

## 2023-05-22 NOTE — Plan of Care (Addendum)
Assumed care of patient at 03:15.     Drowsy, RASS -1 to 0, follows simple commands, irritable/agitated, SB/SR HR 50s-70s, SBP: 80s-120s, NC 2L, diminished BS, no BM, external - 200 cc,     Levo - 2 mcg  Precedex - 0.4    Problem: Safety  Goal: Patient will be free from injury during hospitalization  Outcome: Progressing  Flowsheets (Taken 05/21/2023 0347 by Claiborne Rigg, RN)  Patient will be free from injury during hospitalization:   Assess patient's risk for falls and implement fall prevention plan of care per policy   Provide and maintain safe environment   Use appropriate transfer methods   Ensure appropriate safety devices are available at the bedside   Include patient/ family/ care giver in decisions related to safety  Goal: Patient will be free from infection during hospitalization  Outcome: Progressing  Flowsheets (Taken 05/21/2023 4259 by Claiborne Rigg, RN)  Free from Infection during hospitalization:   Assess and monitor for signs and symptoms of infection   Monitor lab/diagnostic results   Monitor all insertion sites (i.e. indwelling lines, tubes, urinary catheters, and drains)   Encourage patient and family to use good hand hygiene technique     Problem: Inadequate Gas Exchange  Goal: Adequate oxygenation and improved ventilation  Outcome: Progressing  Flowsheets (Taken 05/22/2023 0240 by Octaviano Batty, RN)  Adequate oxygenation and improved ventilation:   Assess lung sounds   Monitor SpO2 and treat as needed   Monitor and treat ETCO2   Provide mechanical and oxygen support to facilitate gas exchange   Position for maximum ventilatory efficiency     Problem: Altered GI Function  Goal: Fluid and electrolyte balance are achieved/maintained  Outcome: Progressing  Flowsheets (Taken 05/21/2023 5638 by Claiborne Rigg, RN)  Fluid and electrolyte balance are achieved/maintained:   Monitor/assess lab values and report abnormal values   Assess and reassess fluid and electrolyte status   Observe  for cardiac arrhythmias   Monitor for muscle weakness     Problem: Non-Violent Restraints Interdisciplinary Plan  Goal: Will be injury free during the use of non-violent restraints  Outcome: Progressing  Flowsheets (Taken 05/21/2023 0627 by Claiborne Rigg, RN)  Will be injury free during the use of non-violent restraints:   Attempt all alternatives before use of restraints   Initiate least restrictive type of restraint that is effective   Provide and maintain safe environment   Include patient/family/caregiver in decisions related to safety   Notify family of initiation of restraints   Ensure safety devices are properly applied and maintained   Document observed patient actions according to protocol   Nurse to accompany patient off unit when on restraints   Remove restraints before the indicated maximum length of time when meets criteria for discontinuation   Document significant changes in patient condition   Provide debriefing as soon as possible and appropriate   Reassess need for continued restraints   Ensure that order for restraints has not expired

## 2023-05-22 NOTE — Plan of Care (Addendum)
GCS 14/15 E4V4M6  RASS +2 to 0  Able to move all extremities, with weakness.   Had 2 doses of prn haloperidol, for agitation  Hemodynamically stable  HR 80-130s- SR to ST, no ectopics, doctor's aware of the episode of ST 130s, a bolus of 1L LR was given. CRT <3secs, warm peripheries  On oxygen 1 lpm via nasal cannula, oxygen saturation of 94-98%  With external urinary catheter, total urine output of  With 3x episode of bowel movement. The first one is black, tarry. The 2nd and 3rd are brownish with red fresh blood, approximately . Doctor's aware  See LDAs for skin assessment  Family updated by the night physician assistant  Plan:  For c diff- laboratory cancelled the sample that was sent due to inadequate sample. Handedover to the night staff to collect and send another c diff sample      With bilateral mitts to prevent interference of medical treatment    Problem: Non-Violent Restraints Interdisciplinary Plan  Goal: Will be injury free during the use of non-violent restraints  Outcome: Not Progressing  Flowsheets (Taken 05/21/2023 0627 by Claiborne Rigg, RN)  Will be injury free during the use of non-violent restraints:   Attempt all alternatives before use of restraints   Initiate least restrictive type of restraint that is effective   Provide and maintain safe environment   Include patient/family/caregiver in decisions related to safety   Notify family of initiation of restraints   Ensure safety devices are properly applied and maintained   Document observed patient actions according to protocol   Nurse to accompany patient off unit when on restraints   Remove restraints before the indicated maximum length of time when meets criteria for discontinuation   Document significant changes in patient condition   Provide debriefing as soon as possible and appropriate   Reassess need for continued restraints   Ensure that order for restraints has not expired      Problem: Moderate/High Fall Risk Score  >5  Goal: Patient will remain free of falls  Outcome: Not Progressing  Flowsheets  Taken 05/22/2023 0400 by Roselee Nova, RN  High (Greater than 13):   HIGH-Bed alarm on at all times while patient in bed   HIGH-Visual cue at entrance to patient's room   HIGH-Initiate use of floor mats as appropriate   HIGH-Consider use of low bed  Taken 05/21/2023 0627 by Claiborne Rigg, RN  VH High Risk (Greater than 13):   ALL REQUIRED LOW INTERVENTIONS   ALL REQUIRED MODERATE INTERVENTIONS   RED "HIGH FALL RISK" SIGNAGE   BED ALARM WILL BE ACTIVATED WHEN THE PATEINT IS IN BED WITH SIGNAGE "RESET BED ALARM"   PATIENT IS TO BE SUPERVISED FOR ALL TOILETING ACTIVITIES   A safety companion may be used when deemed appropriate by the Primary RN and Clinical Administrator   Keep door open for better visibility   Include family/significant other in multidisciplinary discussion regarding plan of care as appropriate   Use assistive devices   Use of floor mat     Problem: Safety  Goal: Patient will be free from injury during hospitalization  Outcome: Not Progressing  Flowsheets (Taken 05/21/2023 0627 by Claiborne Rigg, RN)  Patient will be free from injury during hospitalization:   Assess patient's risk for falls and implement fall prevention plan of care per policy   Provide and maintain safe environment   Use appropriate transfer methods   Ensure appropriate safety devices are available at the bedside  Include patient/ family/ care giver in decisions related to safety  Goal: Patient will be free from infection during hospitalization  Outcome: Not Progressing  Flowsheets (Taken 05/21/2023 0627 by Claiborne Rigg, RN)  Free from Infection during hospitalization:   Assess and monitor for signs and symptoms of infection   Monitor lab/diagnostic results   Monitor all insertion sites (i.e. indwelling lines, tubes, urinary catheters, and drains)   Encourage patient and family to use good hand hygiene technique     Problem: Safety  Goal:  Patient will be free from infection during hospitalization  Outcome: Not Progressing  Flowsheets (Taken 05/21/2023 0627 by Claiborne Rigg, RN)  Free from Infection during hospitalization:   Assess and monitor for signs and symptoms of infection   Monitor lab/diagnostic results   Monitor all insertion sites (i.e. indwelling lines, tubes, urinary catheters, and drains)   Encourage patient and family to use good hand hygiene technique     Problem: Pain  Goal: Pain at adequate level as identified by patient  Outcome: Not Progressing  Flowsheets (Taken 05/21/2023 0627 by Claiborne Rigg, RN)  Pain at adequate level as identified by patient:   Identify patient comfort function goal   Assess for risk of opioid induced respiratory depression, including snoring/sleep apnea. Alert healthcare team of risk factors identified.   Reassess pain within 30-60 minutes of any procedure/intervention, per Pain Assessment, Intervention, Reassessment (AIR) Cycle   Assess pain on admission, during daily assessment and/or before any "as needed" intervention(s)   Evaluate if patient comfort function goal is met   Evaluate patient's satisfaction with pain management progress   Offer non-pharmacological pain management interventions     Problem: Inadequate Gas Exchange  Goal: Adequate oxygenation and improved ventilation  Outcome: Not Progressing  Flowsheets (Taken 05/22/2023 0240 by Octaviano Batty, RN)  Adequate oxygenation and improved ventilation:   Assess lung sounds   Monitor SpO2 and treat as needed   Monitor and treat ETCO2   Provide mechanical and oxygen support to facilitate gas exchange   Position for maximum ventilatory efficiency     Problem: Altered GI Function  Goal: Fluid and electrolyte balance are achieved/maintained  Outcome: Not Progressing  Flowsheets (Taken 05/21/2023 1610 by Claiborne Rigg, RN)  Fluid and electrolyte balance are achieved/maintained:   Monitor/assess lab values and report abnormal values    Assess and reassess fluid and electrolyte status   Observe for cardiac arrhythmias   Monitor for muscle weakness  Goal: Elimination patterns are normal or improving  Outcome: Not Progressing      Problem: Compromised Sensory Perception  Goal: Sensory Perception Interventions  Outcome: Not Progressing  Flowsheets (Taken 05/22/2023 0800)  Sensory Perception Interventions: Offload heels, Pad bony prominences, Reposition q 2hrs/turn Clock, Q2 hour skin assessment under devices if present     Problem: Compromised Moisture  Goal: Moisture level Interventions  Outcome: Not Progressing  Flowsheets (Taken 05/22/2023 0800)  Moisture level Interventions: Moisture wicking products, Moisture barrier cream     Problem: Compromised Activity/Mobility  Goal: Activity/Mobility Interventions  Outcome: Not Progressing  Flowsheets (Taken 05/22/2023 0400 by Roselee Nova, RN)  Activity/Mobility Interventions: Pad bony prominences, TAP Seated positioning system when OOB, Promote PMP, Reposition q 2 hrs / turn clock, Offload heels     Problem: Compromised Nutrition  Goal: Nutrition Interventions  Outcome: Not Progressing  Flowsheets (Taken 05/22/2023 0800)  Nutrition Interventions: Discuss nutrition at Rounds, I&Os, Document % meal eaten, Daily weights     Problem: Compromised Friction/Shear  Goal: Friction  and Shear Interventions  Outcome: Not Progressing  Flowsheets (Taken 05/22/2023 0800)  Friction and Shear Interventions: Pad bony prominences, Off load heels, HOB 30 degrees or less unless contraindicated, Consider: TAP seated positioning, Heel foams

## 2023-05-22 NOTE — SLP Progress Note (Signed)
[facility]  Speech Language Pathology Cancellation Note      Patient:  Dillon Wood MRN#:  [id number]      SLP spoke with RN to obtain clearance for swallowing treatment. RN did not clear patient for treatment due to MD starting trickle feeds via NG tube secondary to GI bleed, and MD feeling patient is unsafe for PO intake at this time. RN also reported patient has been refusing everything staff tries to do with patient, has increased agitation. RN requested SLP to return tomorrow. Will continue to monitor for appropriateness.      Thank you,  Kimley Apsey L.M. Gregor Hams, [place], Clinical biochemist [phone]  Department # [phone]

## 2023-05-22 NOTE — Treatment Plan (Cosign Needed Addendum)
4 eyes in 4 hours pressure injury assessment note:      Completed with:   Unit & Time admitted:              Bony Prominences: Check appropriate box; if wound is present enter wound assessment in LDA     Occiput:                 [x] WNL  []  Wound present  Face:                     [x] WNL  []  Wound present  Ears:                      [x] WNL  []  Wound present  Spine:                    [x] WNL  []  Wound present  Shoulders:             [x] WNL  []  Wound present  Elbows:                  [x] WNL  []  Wound present  Sacrum/coccyx:     [] WNL  [x]  Wound present blanchable redness   Ischial Tuberosity:  [x] WNL  []  Wound present  Trochanter/Hip:      [x] WNL  []  Wound present  Knees:                   [x] WNL  []  Wound present  Ankles:                   [x] WNL  []  Wound present  Heels:                    [] WNL  [x]  Wound present blanchable redness   Other pressure areas:  [x]  Wound location R groin- surgical site for embolization  Bruise on toes on left foot       Device related: []  Device name:         LDA completed if wound present: yes/no  Consult WOCN if necessary    Other skin related issues, ie tears, rash, etc, document in Integumentary flowsheet

## 2023-05-22 NOTE — Treatment Plan (Signed)
4 eyes in 4 hours pressure injury assessment note:      Completed with:   Unit & Time admitted:              Bony Prominences: Check appropriate box; if wound is present enter wound assessment in LDA     Occiput:                 [x] WNL  []  Wound present  Face:                     [x] WNL  []  Wound present  Ears:                      [x] WNL  []  Wound present  Spine:                    [x] WNL  []  Wound present  Shoulders:             [x] WNL  []  Wound present  Elbows:                  [x] WNL  []  Wound present  Sacrum/coccyx:     [] WNL  [x]  Wound blanchable redness present  Ischial Tuberosity:  [x] WNL  []  Wound present  Trochanter/Hip:      [x] WNL  []  Wound present  Knees:                   [x] WNL  []  Wound present  Ankles:                   [x] WNL  []  Wound present  Heels:                    [] WNL  [x]  Wound blanchable redness present  Other pressure areas:  [x]  Wound Location  Right groin puncture site for arteriogram        Device related: []  Device name:         LDA completed if wound present: yes/no  Consult WOCN if necessary    Other skin related issues, ie tears, rash, etc, document in Integumentary flowsheet

## 2023-05-22 NOTE — Progress Notes (Signed)
PROGRESS NOTE  Gastroenterology Consult Service - [facility]  Pager ID: [phone]  Epic Chat (Group): [facility]  [address] [place], [place] [address]  Appointments: [phone]  Date Time: 05/22/23 7:55 AM  Patient Name: Dillon Wood  Requesting Physician: Cline Cools, MD      Assessment and Plan:   Assessment:  89 y.o. male p/w painless hematochezia in the setting of eliquis use, weakness, fall found to have acute blood loss anemia w/ assoc hemorrhagic shock, +CTA for bleeding in the sigmoid s/p emergent embolization by IR earlier this morning 11/26.     LGIB 2/2 sigmoid diverticulosis  - +CTA, s/p IR embolization 11/26  Hemorrhagic Shock on pressor support  Acute blood loss anemia  - s/p 2 units PRBCs 11/26, H/H stable  Hematochezia on presentation   - currently resolved   Dementia  DVT on eliquis (held)  Hx of colon polyps             Plan:  - Monitor for overt GI bleeding  - trend H/H, transfuse PRN  - if re-bleeds then can perform flex sig  - please contact immediately if any changes in his hemodynamics/status    GI will go standby, reconsult as needed.   Case has been reviewed and discussed with the GI attending, Dr. Kathryne Gin, MD, and plan of care formulated together.    Radiology:   Radiological Procedure reviewed (GI pertinent):    CTA -   1.Active hemorrhage within the sigmoid colon likely due to sigmoid   diverticulosis. Recommend interventional radiology consultation to guide   further management.   2. There is no definite evidence of acute inflammatory process.   3. Incidental note is made of gallstones, left renal calculi, prostate   enlargement, and abnormal thickening of the urinary bladder raising concern   for hypertrophy.   4. The possibility of bladder neoplasm cannot be excluded. Consider urology   consultation to guide further management.   5. Other findings as noted above.   6. Critical findings were discussed with, and read back by, Dr. Rennis Harding at   the following  time 05/21/2023 1:05 AM.       Endoscopy:     Colonoscopy 06/22/11:  TRANSVERSE POLYP:       TUBULAR ADENOMA FRAGMENTS, NEGATIVE FOR HIGH GRADE DYSPLASIA      Colonoscopy 06/21/08:  A.  COLON, HEPATIC FLEXURE POLYP, BIOPSY:  SERRATED ADENOMA   B.  CECAL POLYP, BIOPSY:  TUBULAR ADENOMA   C.  SIGMOID COLON, POLYP, BIOPSY:  TUBULAR ADENOMA      Colonoscopy in system 06/21/04:  IMPRESSION:      1.  TWO SMALL POLYPS IN THE SIGMOID. [211.3]. TWO POLYPS WERE REMOVED BY      COLD SNARE.      2.  SINGLE SMALL POLYP IN THE RECTUM. [211.4]. THE POLYP WAS REMOVED BY      SNARE CAUTERY.      3.  SMALL INTERNAL HEMORRHOIDS [455.0].      4.  THERE WAS NO EVIDENCE OF DIVERTICULOSIS.      5.  COLONOSCOPY, OTHERWISE NORMAL.      A.  SIGMOID POLYPS, POLYPECTOMY:           TUBULAR ADENOMA, NEGATIVE FOR HIGH GRADE DYSPLASIA AND MALIGNANCY   B.  RECTAL POLYP, POLYPECTOMY:       1.  SUBMUCOSAL LEIOMYOMA       2.  NO ADENOMAS OR MALIGNANCY NOTED       Subjective:  Patient is confused. Unable to obtain hx.     Medications:     Current Facility-Administered Medications   Medication Dose Route Frequency    atorvastatin  40 mg Oral Daily    cefTRIAXone  1 g Intravenous Q24H    lidocaine  1 patch Transdermal Q24H    magnesium sulfate  1 g Intravenous Q1H    metroNIDAZOLE  500 mg Intravenous Q8H    OLANZapine  2.5 mg Oral Q12H SCH    ondansetron  4 mg Intravenous Once    potassium chloride  40 mEq Oral Once    QUEtiapine  25 mg Oral Q12H    senna-docusate  1 tablet Oral Q12H SCH    tamsulosin  0.4 mg Oral Daily after dinner         Physical Exam:     Vitals:    05/22/23 0700   BP: 91/56   Pulse: 80   Resp: (!) 27   Temp:    SpO2: 98%       General appearance: Well developed, well nourished, appears stated age and in NAD  Eyes: Sclera anicteric, pink conjunctivae  ENMT: mucous membranes moist, nose and ears appear normal.  Oropharynx clear.  Chest: Non labored respirations, no audible wheezing  Abdomen: soft, non-tender, non-distended  Skin: No  pallor, no jaundice  Mental status: Confused.      Labs:     Recent Labs     05/22/23  0305 05/21/23  2223   WBC 15.87* 15.39*   Hemoglobin 8.8* 9.3*   Hematocrit 25.8* 27.3*   Platelet Count 68* 70*   MCV 90.8 91.6       Recent Labs     05/22/23  0305 05/21/23  0455   Sodium 139 137   Potassium 3.7 4.4   Chloride 113* 114*   CO2 21 18   BUN 14 21   Creatinine 1.1 1.3   Glucose 129* 179*   Calcium 8.0 7.3*   Magnesium 1.4*  --        Recent Labs     05/20/23  2313   AST (SGOT) 19   ALT 14   Alkaline Phosphatase 89   Bilirubin, Total 0.5   Protein, Total 5.4*   Albumin 2.9*       Recent Labs     05/22/23  0305 05/21/23  2004   PTT 27 28   PT 15.3* 15.1*   INR 1.4 1.3            Valorie Roosevelt, [place]

## 2023-05-22 NOTE — Plan of Care (Signed)
Neuro- GCS of E4-V4-M6 with no sedation ongoing. Able to move extremities. Still on Bilateral mitt restraint for interference with medical treatment.  CV- NSR to Sinus Tachy  HR- 90's to 110's. SBP- 90's to 120's. Palpable pulses.   Respi- On room air saturating well. With no signs of desaturation. Sating 93-97%.   GI- on NPO. soft, non-tender, non-distended abdomen. Bmx2 with hematochezia.   GU-on external catheter. Total UO-700   Skin-see 4 eyes.     Problem: Moderate/High Fall Risk Score >5  Goal: Patient will remain free of falls  Outcome: Progressing  Flowsheets  Taken 05/22/2023 2000 by Con Memos, RN  High (Greater than 13): HIGH-Bed alarm on at all times while patient in bed  Taken 05/21/2023 0627 by Claiborne Rigg, RN  VH High Risk (Greater than 13):   ALL REQUIRED LOW INTERVENTIONS   ALL REQUIRED MODERATE INTERVENTIONS   RED "HIGH FALL RISK" SIGNAGE   BED ALARM WILL BE ACTIVATED WHEN THE PATEINT IS IN BED WITH SIGNAGE "RESET BED ALARM"   PATIENT IS TO BE SUPERVISED FOR ALL TOILETING ACTIVITIES   A safety companion may be used when deemed appropriate by the Primary RN and Clinical Administrator   Keep door open for better visibility   Include family/significant other in multidisciplinary discussion regarding plan of care as appropriate   Use assistive devices   Use of floor mat     Problem: Safety  Goal: Patient will be free from injury during hospitalization  Outcome: Progressing  Flowsheets (Taken 05/21/2023 1610 by Claiborne Rigg, RN)  Patient will be free from injury during hospitalization:   Assess patient's risk for falls and implement fall prevention plan of care per policy   Provide and maintain safe environment   Use appropriate transfer methods   Ensure appropriate safety devices are available at the bedside   Include patient/ family/ care giver in decisions related to safety  Goal: Patient will be free from infection during hospitalization  Outcome: Progressing  Flowsheets (Taken  05/21/2023 0627 by Claiborne Rigg, RN)  Free from Infection during hospitalization:   Assess and monitor for signs and symptoms of infection   Monitor lab/diagnostic results   Monitor all insertion sites (i.e. indwelling lines, tubes, urinary catheters, and drains)   Encourage patient and family to use good hand hygiene technique     Problem: Pain  Goal: Pain at adequate level as identified by patient  Outcome: Progressing  Flowsheets (Taken 05/21/2023 0627 by Claiborne Rigg, RN)  Pain at adequate level as identified by patient:   Identify patient comfort function goal   Assess for risk of opioid induced respiratory depression, including snoring/sleep apnea. Alert healthcare team of risk factors identified.   Reassess pain within 30-60 minutes of any procedure/intervention, per Pain Assessment, Intervention, Reassessment (AIR) Cycle   Assess pain on admission, during daily assessment and/or before any "as needed" intervention(s)   Evaluate if patient comfort function goal is met   Evaluate patient's satisfaction with pain management progress   Offer non-pharmacological pain management interventions     Problem: Altered GI Function  Goal: Fluid and electrolyte balance are achieved/maintained  05/22/2023 2147 by Con Memos, RN  Outcome: Progressing  Flowsheets (Taken 05/22/2023 2147)  Fluid and electrolyte balance are achieved/maintained:   Monitor/assess lab values and report abnormal values   Assess and reassess fluid and electrolyte status   Observe for cardiac arrhythmias   Monitor for muscle weakness  05/22/2023 2146 by Con Memos,  RN  Outcome: Progressing  Flowsheets (Taken 05/21/2023 0627 by Claiborne Rigg, RN)  Fluid and electrolyte balance are achieved/maintained:   Monitor/assess lab values and report abnormal values   Assess and reassess fluid and electrolyte status   Observe for cardiac arrhythmias   Monitor for muscle weakness  Goal: Elimination patterns are normal or  improving  Outcome: Progressing  Flowsheets (Taken 05/21/2023 0627 by Claiborne Rigg, RN)  Elimination patterns are normal or improving: (no rectal bleed or stool) --     Problem: Non-Violent Restraints Interdisciplinary Plan  Goal: Will be injury free during the use of non-violent restraints  Outcome: Progressing  Flowsheets (Taken 05/21/2023 1610 by Claiborne Rigg, RN)  Will be injury free during the use of non-violent restraints:   Attempt all alternatives before use of restraints   Initiate least restrictive type of restraint that is effective   Provide and maintain safe environment   Include patient/family/caregiver in decisions related to safety   Notify family of initiation of restraints   Ensure safety devices are properly applied and maintained   Document observed patient actions according to protocol   Nurse to accompany patient off unit when on restraints   Remove restraints before the indicated maximum length of time when meets criteria for discontinuation   Document significant changes in patient condition   Provide debriefing as soon as possible and appropriate   Reassess need for continued restraints   Ensure that order for restraints has not expired     Problem: Compromised Activity/Mobility  Goal: Activity/Mobility Interventions  Outcome: Progressing  Flowsheets (Taken 05/22/2023 2000)  Activity/Mobility Interventions: Pad bony prominences, TAP Seated positioning system when OOB, Promote PMP, Reposition q 2 hrs / turn clock, Offload heels     Problem: Compromised Nutrition  Goal: Nutrition Interventions  Outcome: Progressing  Flowsheets (Taken 05/22/2023 2000)  Nutrition Interventions: Discuss nutrition at Rounds, I&Os, Document % meal eaten, Daily weights     Problem: Fluid and Electrolyte Imbalance/ Endocrine  Goal: Fluid and electrolyte balance are achieved/maintained  05/22/2023 2147 by Con Memos, RN  Outcome: Progressing  Flowsheets (Taken 05/22/2023 2147)  Fluid and electrolyte  balance are achieved/maintained:   Monitor/assess lab values and report abnormal values   Assess and reassess fluid and electrolyte status   Observe for cardiac arrhythmias   Monitor for muscle weakness  05/22/2023 2146 by Con Memos, RN  Outcome: Progressing  Flowsheets (Taken 05/21/2023 0627 by Claiborne Rigg, RN)  Fluid and electrolyte balance are achieved/maintained:   Monitor/assess lab values and report abnormal values   Assess and reassess fluid and electrolyte status   Observe for cardiac arrhythmias   Monitor for muscle weakness     Problem: Bleeding Precautions  Goal: Free from bleeding  Outcome: Progressing  Flowsheets (Taken 05/22/2023 2148)  Free from bleeding:   Monitor/assess site of invasive procedure for signs of bleeding   Avoid needle sticks when possible   Hold pressure/apply pressure dressing to any puncture sites   Monitor hemoglobin and hematocrit   Report signs of bleeding

## 2023-05-23 DIAGNOSIS — K922 Gastrointestinal hemorrhage, unspecified: Secondary | ICD-10-CM

## 2023-05-23 LAB — CBC
Absolute nRBC: 0 10*3/uL (ref <=0.00)
Absolute nRBC: 0 10*3/uL (ref <=0.00)
Absolute nRBC: 0 10*3/uL (ref <=0.00)
Hematocrit: 24.8 % — ABNORMAL LOW (ref 37.6–49.6)
Hematocrit: 24.3 % — ABNORMAL LOW (ref 37.6–49.6)
Hematocrit: 26.2 % — ABNORMAL LOW (ref 37.6–49.6)
Hemoglobin: 8.3 g/dL — ABNORMAL LOW (ref 12.5–17.1)
Hemoglobin: 8.3 g/dL — ABNORMAL LOW (ref 12.5–17.1)
Hemoglobin: 9.1 g/dL — ABNORMAL LOW (ref 12.5–17.1)
MCH: 30.4 pg (ref 25.1–33.5)
MCH: 31.3 pg (ref 25.1–33.5)
MCH: 31.8 pg (ref 25.1–33.5)
MCHC: 33.5 g/dL (ref 31.5–35.8)
MCHC: 34.2 g/dL (ref 31.5–35.8)
MCHC: 34.7 g/dL (ref 31.5–35.8)
MCV: 90.8 fL (ref 78.0–96.0)
MCV: 91.7 fL (ref 78.0–96.0)
MCV: 91.6 fL (ref 78.0–96.0)
MPV: 9.1 fL (ref 8.9–12.5)
MPV: 9.2 fL (ref 8.9–12.5)
MPV: 9.1 fL (ref 8.9–12.5)
Platelet Count: 76 10*3/uL — ABNORMAL LOW (ref 142–346)
Platelet Count: 76 10*3/uL — ABNORMAL LOW (ref 142–346)
Platelet Count: 87 10*3/uL — ABNORMAL LOW (ref 142–346)
RBC: 2.73 10*6/uL — ABNORMAL LOW (ref 4.20–5.90)
RBC: 2.65 10*6/uL — ABNORMAL LOW (ref 4.20–5.90)
RBC: 2.86 10*6/uL — ABNORMAL LOW (ref 4.20–5.90)
RDW: 15 % (ref 11–15)
RDW: 15 % (ref 11–15)
RDW: 15 % (ref 11–15)
WBC: 14.6 10*3/uL — ABNORMAL HIGH (ref 3.10–9.50)
WBC: 13.59 10*3/uL — ABNORMAL HIGH (ref 3.10–9.50)
WBC: 14.31 10*3/uL — ABNORMAL HIGH (ref 3.10–9.50)
nRBC %: 0 /100{WBCs} (ref <=0.0)
nRBC %: 0 /100{WBCs} (ref <=0.0)
nRBC %: 0 /100{WBCs} (ref <=0.0)

## 2023-05-23 LAB — BASIC METABOLIC PANEL
Anion Gap: 8 (ref 5.0–15.0)
BUN: 16 mg/dL (ref 9–28)
CO2: 18 meq/L (ref 17–29)
Calcium: 8.2 mg/dL (ref 7.9–10.2)
Chloride: 115 meq/L — ABNORMAL HIGH (ref 99–111)
Creatinine: 1.3 mg/dL (ref 0.5–1.5)
GFR: 51.9 mL/min/{1.73_m2} — ABNORMAL LOW (ref >=60.0)
Glucose: 94 mg/dL (ref 70–100)
Potassium: 4.1 meq/L (ref 3.5–5.3)
Sodium: 141 meq/L (ref 135–145)

## 2023-05-23 LAB — PT AND APTT
INR: 1.5 (ref 0.9–1.1)
PT: 17.4 s — ABNORMAL HIGH (ref 10.1–12.9)
PTT: 26 s — ABNORMAL LOW (ref 27–39)

## 2023-05-23 LAB — MAGNESIUM: Magnesium: 1.9 mg/dL (ref 1.6–2.6)

## 2023-05-23 MED ORDER — QUETIAPINE FUMARATE 25 MG PO TABS
50.0000 mg | ORAL_TABLET | Freq: Every evening | ORAL | Status: DC
Start: 2023-05-23 — End: 2023-05-25
  Administered 2023-05-23 – 2023-05-24 (×2): 50 mg via ORAL
  Filled 2023-05-23 (×2): qty 2

## 2023-05-23 MED ORDER — ASPIRIN 81 MG PO CHEW
81.0000 mg | CHEWABLE_TABLET | Freq: Every day | ORAL | Status: DC
Start: 2023-05-23 — End: 2023-05-28
  Administered 2023-05-23 – 2023-05-28 (×6): 81 mg via ORAL
  Filled 2023-05-23 (×6): qty 1

## 2023-05-23 NOTE — Progress Notes (Addendum)
4 eyes in 4 hours pressure injury assessment note:      Completed with: Selam, RN  Unit & Time admitted: U21 2106A            Bony Prominences: Check appropriate box; if wound is present enter wound assessment in LDA     Occiput:                 [x] WNL  []  Wound present  Face:                     [x] WNL  []  Wound present  Ears:                      [x] WNL  []  Wound present  Spine:                    [x] WNL  []  Wound present  Shoulders:             [x] WNL  []  Wound present  Elbows:                  [x] WNL  []  Wound present  Sacrum/coccyx:     [] WNL  [x]  Wound present  Ischial Tuberosity:  [x] WNL  []  Wound present  Trochanter/Hip:      [x] WNL  []  Wound present  Knees:                   [x] WNL  []  Wound present  Ankles:                   [x] WNL  []  Wound present  Heels:                    [] WNL  [x]  Wound present  Other pressure areas:  [x]  Wound location       Device related: []  Device name:         LDA completed if wound present: Yes    Other skin related issues, ie tears, rash, etc, document in Integumentary flowsheet

## 2023-05-23 NOTE — Plan of Care (Addendum)
NURSING SHIFT NOTE     Patient: Dillon Wood [facility]  Day: 2      SHIFT EVENTS     Shift Narrative/Significant Events (PRN med administration, fall, RRT, etc.):     Pt AOx1-2, pt confused/forgetful, hx of dementia, impulsive with attempts to reach/pull corpak/IVs/lines, B/L safety mitts in place, orders up to date, safety checks done per protocol/as needed, 1:1 safety sitter at the bedside, bed alarm in place, pt requires close monitoring. Pt denies having any pain or n/v this shift. Pt running SR/ST/PVCs with HR in the 80s-100s, SBPs 90s-100s, afebrile. Mg 1.9 in the AM, pt received 2nd of Mg sulfate replacement. H/H 8.3/24.3 in the AM, next H/H check at 1600. Corpak in place, flushing well, TF Promote initiated at 42ml/hr with 30ml water flushes q4h, increased to 32ml/hr, tolerating well. No BMs thus far this shift. External catheter in place to wall suction, yellow/clear urine. Hourly rounds done. Call bell, telephone, and personal items within reach. Will continue to monitor.    Report called to U21, pt transferred to room 2106A.    Safety and fall precautions remain in place. Purposeful rounding completed.          ASSESSMENT     Changes in assessment from patient's baseline this shift:    Neuro: No  CV: No  Pulm: No  Peripheral Vascular: No  HEENT: No  GI: Yes TF Promote initiated at 64ml/hr  BM during shift: No, Last BM: Last BM Date: 05/23/23  GU: No   Integ: No  MS: No    Pain: None  Pain Interventions: Rest and Positioning  Medications Utilized: NA    Mobility: PMP Activity: Step 3 - Bed Mobility of             Lines     Patient Lines/Drains/Airways Status       Active Lines, Drains and Airways       Name Placement date Placement time Site Days    Peripheral IV 05/20/23 18 G Left Antecubital 05/20/23  2323  Antecubital  2    Peripheral IV 05/21/23 20 G Distal;Right Forearm 05/21/23  0600  Forearm  2    External Urinary Catheter 05/21/23  1700  --  1    Feeding Tube NG 12 Fr. Right nare 05/21/23  2100   Right nare  1                         VITAL SIGNS     Vitals:    05/23/23 1200   BP: 105/52   Pulse: 87   Resp: 20   Temp: 98 F (36.7 C)   SpO2: 93%       Temp  Min: 97.8 F (36.6 C)  Max: 99.9 F (37.7 C)  Pulse  Min: 85  Max: 131  Resp  Min: 19  Max: 43  BP  Min: 86/49  Max: 154/74  SpO2  Min: 82 %  Max: 98 %      Intake/Output Summary (Last 24 hours) at 05/23/2023 1333  Last data filed at 05/23/2023 1200  Gross per 24 hour   Intake 190 ml   Output 700 ml   Net -510 ml          OXYGEN WEANING     Stable to wean?: Yes  Attempted to wean?: Yes     Symptoms:     Oxygen saturation at rest maintained at:  93-94% at 1 liters/min via  nasal cannula          Oxygen saturation with ambulation maintained at:  NA% at NA          Patient self-proning?: No: NA     Patient using incentive spirometer?: No: NA   Amount achieved on incentive spirometer?: No data recorded      CARE PLAN       Problem: Moderate/High Fall Risk Score >5  Goal: Patient will remain free of falls  Outcome: Progressing  Flowsheets (Taken 05/23/2023 0800)  High (Greater than 13):   HIGH-Visual cue at entrance to patient's room   HIGH-Bed alarm on at all times while patient in bed   HIGH-Apply yellow "Fall Risk" arm band   HIGH-Initiate use of floor mats as appropriate   HIGH-Consider use of low bed     Problem: Safety  Goal: Patient will be free from injury during hospitalization  Outcome: Progressing  Flowsheets (Taken 05/23/2023 1332)  Patient will be free from injury during hospitalization:   Assess patient's risk for falls and implement fall prevention plan of care per policy   Provide and maintain safe environment   Ensure appropriate safety devices are available at the bedside   Include patient/ family/ care giver in decisions related to safety   Assess for patients risk for elopement and implement Elopement Risk Plan per policy   Provide alternative method of communication if needed (communication boards, writing)   Hourly rounding   Use  appropriate transfer methods  Goal: Patient will be free from infection during hospitalization  Outcome: Progressing  Flowsheets (Taken 05/23/2023 1332)  Free from Infection during hospitalization:   Assess and monitor for signs and symptoms of infection   Monitor all insertion sites (i.e. indwelling lines, tubes, urinary catheters, and drains)   Encourage patient and family to use good hand hygiene technique   Monitor lab/diagnostic results     Problem: Pain  Goal: Pain at adequate level as identified by patient  Outcome: Progressing  Flowsheets (Taken 05/23/2023 1332)  Pain at adequate level as identified by patient:   Identify patient comfort function goal   Reassess pain within 30-60 minutes of any procedure/intervention, per Pain Assessment, Intervention, Reassessment (AIR) Cycle   Evaluate if patient comfort function goal is met   Evaluate patient's satisfaction with pain management progress   Offer non-pharmacological pain management interventions   Include patient/patient care companion in decisions related to pain management as needed     Problem: Inadequate Gas Exchange  Goal: Adequate oxygenation and improved ventilation  Outcome: Progressing  Flowsheets (Taken 05/23/2023 1332)  Adequate oxygenation and improved ventilation:   Assess lung sounds   Plan activities to conserve energy: plan rest periods   Teach/reinforce use of incentive spirometer 10 times per hour while awake, cough and deep breath as needed     Problem: Altered GI Function  Goal: Fluid and electrolyte balance are achieved/maintained  Outcome: Progressing  Flowsheets (Taken 05/23/2023 1332)  Fluid and electrolyte balance are achieved/maintained:   Monitor/assess lab values and report abnormal values   Assess and reassess fluid and electrolyte status   Monitor for muscle weakness   Observe for cardiac arrhythmias  Goal: Elimination patterns are normal or improving  Outcome: Progressing     Problem: Non-Violent Restraints Interdisciplinary  Plan  Goal: Will be injury free during the use of non-violent restraints  Outcome: Progressing  Flowsheets (Taken 05/23/2023 1332)  Will be injury free during the use of non-violent restraints:   Attempt all alternatives  before use of restraints   Initiate least restrictive type of restraint that is effective   Provide and maintain safe environment   Notify family of initiation of restraints   Include patient/family/caregiver in decisions related to safety   Ensure safety devices are properly applied and maintained   Document observed patient actions according to protocol   Remove restraints before the indicated maximum length of time when meets criteria for discontinuation   Nurse to accompany patient off unit when on restraints   Document significant changes in patient condition   Provide debriefing as soon as possible and appropriate   Ensure that order for restraints has not expired   Reassess need for continued restraints     Problem: Fluid and Electrolyte Imbalance/ Endocrine  Goal: Fluid and electrolyte balance are achieved/maintained  Outcome: Progressing  Flowsheets (Taken 05/23/2023 1332)  Fluid and electrolyte balance are achieved/maintained:   Monitor/assess lab values and report abnormal values   Assess and reassess fluid and electrolyte status   Monitor for muscle weakness   Observe for cardiac arrhythmias     Problem: Bleeding Precautions  Goal: Free from bleeding  Outcome: Progressing  Flowsheets (Taken 05/23/2023 1332)  Free from bleeding:   Monitor/assess site of invasive procedure for signs of bleeding   Avoid using a razor with blades for shaving   Provide dietary precautions

## 2023-05-23 NOTE — Nursing Progress Note (Signed)
4 eyes in 4 hours pressure injury assessment note:      Completed with: Aschalew, RN  Unit & Time admitted: U21 @ 1500             Bony Prominences: Check appropriate box; if wound is present enter wound assessment in LDA     Occiput:                 [] WNL  []  Wound present Squamous Cell Carcinoma   Face:                     [] WNL  []  Wound present Squamous Cell Carcinoma   Ears:                      [x] WNL  []  Wound present  Spine:                    [x] WNL  []  Wound present  Shoulders:             [] WNL  []  Wound present Bruising on the Right shoulder   Elbows:                  [x] WNL  []  Wound present Dryness, redness,   Sacrum/coccyx:     [] WNL  []  Wound present Redness  Ischial Tuberosity:  [x] WNL  []  Wound present  Trochanter/Hip:      [x] WNL  []  Wound present  Knees:                   [x] WNL  []  Wound present  Ankles:                   [x] WNL  []  Wound present  Heels:                    [] WNL  []  Wound present  Other pressure areas:  []  Wound location       Device related: []  Device name:         LDA completed if wound present: yes/no  Consult WOCN if necessary    Other skin related issues, ie tears, rash, etc, document in Integumentary flowsheet    Pt has scattered redness and dry/ Flaky skin. Photograph on admission to the unit .

## 2023-05-23 NOTE — Progress Notes (Signed)
PROGRESS NOTE  Gastroenterology Consult Service - [facility]  Pager ID: [phone]  Epic Chat (Group): [facility]  [address] [place], [place] [address]  Appointments: [phone]  Date Time: 05/23/23 7:54 AM  Patient Name: Dillon Wood  Requesting Physician: Domingo Dimes, MD      Assessment and Plan:   Assessment:  89 y.o. male  p/w painless hematochezia in the setting of eliquis use, weakness, fall found to have acute blood loss anemia w/ assoc hemorrhagic shock, +CTA for bleeding in the sigmoid s/p emergent embolization by IR 11/26. Overnight, had 2 BM with BRB. H/H overall stable.      LGIB 2/2 sigmoid diverticulosis  - +CTA, s/p IR embolization 11/26  Hemorrhagic Shock on pressor support  - resolved  Acute blood loss anemia  - s/p 2 units PRBCs 11/26, H/H stable  Hematochezia on presentation   Dementia  DVT on eliquis (held)  Hx of colon polyps           Plan:  - Monitor for overt GI bleeding  - trend H/H, transfuse PRN  - if re-bleeds then can perform flex sig. At this time, suspect more mild bleeding is from the embolization and is expected.   - OK to resume TF from GI standpoint  - please contact immediately if any changes in his hemodynamics/status      Case has been reviewed and discussed with the GI attending, Dr. Kathryne Gin, MD, and plan of care formulated together.  Called pts dtr without answer.      Subjective:   Sleeping. Patient is confused at baseline.    Medications:     Current Facility-Administered Medications   Medication Dose Route Frequency    atorvastatin  40 mg Oral Daily    cefTRIAXone  1 g Intravenous Q24H    lidocaine  1 patch Transdermal Q24H    melatonin  6 mg Oral QHS    metroNIDAZOLE  500 mg Intravenous Q8H    OLANZapine  2.5 mg Oral Q12H SCH    ondansetron  4 mg Intravenous Once    pantoprazole  40 mg Intravenous Q12H    QUEtiapine  100 mg Oral QHS    QUEtiapine  50 mg Oral Daily    senna-docusate  1 tablet Oral Q12H SCH    tamsulosin  0.4 mg Oral Daily  after dinner         Physical Exam:     Vitals:    05/23/23 0700   BP: 94/75   Pulse: 92   Resp: 19   Temp:    SpO2: 94%       General appearance: Sleeping  ENMT: mucous membranes moist, nose and ears appear normal.  Oropharynx clear.  Chest: Non labored respirations, no audible wheezing  Abdomen: soft, non-tender, non-distended  Skin: No pallor, no jaundice      Labs:     Recent Labs     05/23/23  0223 05/22/23  2008   WBC 14.60* 18.71*   Hemoglobin 8.3* 9.6*   Hematocrit 24.8* 28.1*   Platelet Count 76* 88*   MCV 90.8 90.4       Recent Labs     05/23/23  0635 05/22/23  0305   Sodium 141 139   Potassium 4.1 3.7   Chloride 115* 113*   CO2 18 21   BUN 16 14   Creatinine 1.3 1.1   Glucose 94 129*   Calcium 8.2 8.0   Magnesium 1.9 1.4*  Recent Labs     05/20/23  2313   AST (SGOT) 19   ALT 14   Alkaline Phosphatase 89   Bilirubin, Total 0.5   Protein, Total 5.4*   Albumin 2.9*       Recent Labs     05/23/23  0449 05/22/23  0305   PTT 26* 27   PT 17.4* 15.3*   INR 1.5 1.4            Valorie Roosevelt, [place]

## 2023-05-23 NOTE — Progress Notes (Addendum)
[facility] ICU  Progress Note      Patient Name: Dillon Wood  MRN: [id number]  Room: A2915/A2915-01    Reason for Admission: GI bleed  ICU Day: 2d 4h      Overview   Kunio Brackens is a 89 y.o. 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  No history of GI bleeds in the past.  In ED patient was afebrile, heart rate 90, blood pressure 75/48, satting 96% on room air with no respiratory distress, labs notable for hemoglobin 9.1, platelets 116, creatinine 1.6, lactate 2.3, INR 2.0, PTT 22.  Electrolytes, LFTs, and troponin within normal limits.  While in the ED patient had large-volume BRBPR with significant clot burden, repeat CBC with hemoglobin 7.7.  CXR with patchy airspace opacities, CT head negative for acute intracranial findings.  CTA abdomen pelvis with active hemorrhage in the sigmoid colon likely due to diverticulosis.  In ED patient received 2 L IVF, Kcentra for apixaban reversal, IV TXA, and 2 units PRBC with improvement in blood pressure, lactate normalized. Patient went emergently to IR with Dr. Excell Seltzer, who noted no active extravasation noted. Empiric Gelfoam embolization of sigmoid branches of the IMA performed. Single coil placed in the superior rectal artery as well. Hypotensive in IR suite. MTP activated. Admitted to ICU postprocedure.      While in the ICU, patient was hypotensive requiring Levophed and was agitated, required Precedex which was weaned off and then restarted.  Patient had CBCs which were stable with no reported bloody bowel movements.    Subjective   Hospital Course:  11/27: Patient seen and examined at bedside.  Patient on Precedex which is currently weaned off.  Patient somewhat confused but somewhat responsive to commands.  NG tube placed  overnight to give antipsychotics given agitation and midodrine for hypotension.  There was a report of bloody bowel movement overnight which appeared dark suspect to be old    11/28: Has two episodes of BRB overnight. Hgb remains stable. Hemodynamically stable.    Objective   Physical Examination     Last vent settings if on vent:       Vitals Temp:  [97.8 F (36.6 C)-99.9 F (37.7 C)]   Heart Rate:  [81-135]   Resp Rate:  [15-43]   BP: (86-154)/(49-97)   SpO2:  [82 %-98 %]   Weight:  [72.1 kg (158 lb 15.2 oz)]   BMI (calculated):  [23.5]  // Temperature with 24 range    Input / Output:   Intake/Output Summary (Last 24 hours) at 05/23/2023 0840  Last data filed at 05/23/2023 0800  Gross per 24 hour   Intake 150 ml   Output 700 ml   Net -550 ml       General: Elderly, frail, resting comfortably, in no acute distress     Neuro:    Awake, oriented to self only, moving all extremities spontaneously      Lungs:   LCTA      Cardiac:   RRR, normal s1/s2     Abdomen:    Soft, nontender, nondistended      Extremities:   Normal peripheral pulses, no pedal edema      Skin:     Warm, dry, intact  Assessment     Active Problems:   #                Recent Labs     05/23/23  0816 05/23/23  0223 05/22/23  2008 05/22/23  1250 05/22/23  0305 05/21/23  2223 05/21/23  2004 05/21/23  1533 05/21/23  0905 05/21/23  0455 05/20/23  2313   Platelet Count 76* 76* 88* 82* 68* 70* 73* 69* 75* 84* 116*     Diagnosis: Moderate Thrombocytopenia               Recent Labs   Lab 05/23/23  0816 05/23/23  0223 05/22/23  2008   Hemoglobin 8.3* 8.3* 9.6*   Hematocrit 24.3* 24.8* 28.1*   MCV 91.7 90.8 90.4   WBC 13.59* 14.60* 18.71*   Platelet Count 76* 76* 88*         Anemia Diagnosis: Acute: Acute Blood Loss Anemia         Neuro   History of dementia  Acute metabolic encephalopathy     Cardiovascular   Hemorrhagic shock  PVD status post stent     Renal  Chronic kidney disease     Hematology  Acute blood loss anemia  Lower GI bleed due to  sigmoid diverticulosis  History of LLE DVT on apixaban  History of squamous cell carcinoma    Plan     NEUROLOGICAL:   # History of dementia  # Acute metabolic encephalopathy, secondary to shock, ICU delirium, UTI, history of sundowning in the setting of dementia  - Delirium precautions, CAM-ICU monitoring  - Pain control with as needed Tylenol  - PRN IV haldol   -Since patient cannot take oral medications, NG tube was placed and patient was given high doses of antipsychotics including 50 mg Seroquel a.m.,  decrease night Seroquel at nighttime to 50 mg, twice daily Zyprexa  -PT OT     CARDIOVASCULAR:   # Shock multifactorial, secondary hemorrhagic, hypovolemic - resolved   # History of peripheral vascular disease s/p stent  - MAP goal > 65   - s/p  2u PRBC, 2u FFP and 4L IVF  - Continuous telemetry monitoring   - Resume home ASA 81 mg PO and statin      PULMONARY:   - Supplemental oxygen as needed to keep sat > 92%  -Wean off oxygen as tolerated     RENAL:   # CKD  - Trend Renal function  - Replace electrolytes as necessary  - Incidental finding of abnormal thickening of the urinary bladder, consider outpatient urology follow-up for further management/evaluation, concern for possible bladder neoplasm.     GASTROINTESTINAL:  # Acute Lower GI Bleed s/p empiric sigmoid branches of the IMA and single coil placed in the superior rectal artery  # Sigmoid Diverticulosis   - IR following, GI following  - Hgb remains stable, bloody BM likely old blood, continue to monitor  -Protonix 40 twice daily, CBC q12H   - Nutrition: NG tube placed, resume tube feeds to goal   - Bowel Regimen   - GI ppx: As above     INFECTIOUS DISEASE:   -Low suspicion of septic shock or severe sepsis  - Urine culture negative, discontinue ceftriaxone and flagyl      HEMATOLOGY/ONCOLOGY:   # Acute blood loss anemia  # History of LLE DVT in 2022 on apixaban  - Trend CBC q12H  - VTE ppx: SCDs only     ENDOCRINOLOGY/RHEUMATOLOGY:   -  Hypoglycemia protocol       Advance Care Planning: Open ACP Navigator  Code Status: NO CPR - SUPPORT OK  Next of Kin:  Primary Emergency Contact: Iglesias,Murray           ADVANCE CARE PLAN                 I have personally assessed the patient and based my assessment and medical decision-making on a review of the patient's history and 24-hour interval events along with medical records, physical examination, vital signs, analysis of recent laboratory results, evaluation of radiology images, and monitoring data. The findings and plan of care was discussed with the care team.      Total care time: 35 minutes    Domingo Dimes, MD  05/23/2023 12:26 PM

## 2023-05-23 NOTE — SLP Progress Note (Addendum)
[facility]  Speech Therapy Treatment Note    Patient:  Dillon Wood MRN#:  [id number]  Unit:  [facility] UNIT 21 Room/Bed:  A2106/A2106-A      Time of treatment:   SLP Received On: 05/23/23  Start Time: 1545  Stop Time: 1623  Time Calculation (min): 38 min       Personal Protective Equipment (PPE)  Gloves and [id number]    Subjective: RN cleared pt for therapy, reporting appropriateness following transfer to unit from ICU.  Pt encountered with nursing sitter present for safety monitoring.  Pt with (B) mitten restraints in place; dysarthric speech with frequent language of confusion.  Pt with mod agitation and restlessness, requiring repositioning in bed to optimize p.o. safety.          Objective: Oropharyngeal analysis during PO trials in order to determine LRD,development and training of swallow strategies in order to improve swallow function and decrease risk of penetration/aspiration;  therapeutic feedings by SLP  and safe swallowing precautions education. Pt with previous recs for MM5/TN0 (on 11/26), now NPO with NGT in place (initiated on 11/27 d/t GIB with reduced p.o. safety per MD recs).      Assessment: Pt initially orally defensive with refusal behaviors with initial ice chip presentations.  Max verbal cueing, modeling and encouragement provided for oral cavity opening.  Pt with gradual increase in p.o. acceptance over time when thickened cranberry juice placed on lips as a form of gustatory priming.  Pt with swallow delay and subjectively reduced laryngeal elevation via palpation with mildly thick liquid via 1/2 tsp presentation.  With increase to full tsp presentations, pt with increased swallows per bolus with wet voicing pre- and post-swallow.  Pt showed evidence of awareness per statement, "That one wasn't good."  Pt was cued to throat clear-cough with reswallow but with limited carry-over.  More timely subsequent swallows were achieved with use of firm dry spoon massage to  medial lingual surface.  Pt with eliminated cough and mod improvement in vocal quality using this sensorimotor stim technique.  Pt refused further training with a moderately thick liquid; additional p.o. trials deferred accordingly.    Pt presents with mod-sev oropharyngeal dysphagia d/t pharyngeal muscle weakness with cog deficits (dementia dx)  negatively impacting.  Pt's compromised swallow coordination and pharyngeal contraction and clearance lead to evidence of laryngeal penetration and mild aspiration signs post-swallow with a modified liquid consistency this date.  Esophageal dysphagia cannot be excluded d/t (potential backflow leading to latent cough post-p.o. intake).  P.o. readiness not demonstrated.      Plan/Recommendations:    NPO  and NPO liquids - continue NGT and current GI recs;  2.   Precautions - N/A - SLP-guided therapeutic p.o. trials only at this time.        RN handoff completed in room post-tx with comprehension verbalized re tx results and continued NPO recs.  Pt may benefit from suction set-up to assist with mucus mgmt (? Increased mucus production secondary to oral intake).  PA notified via secure chat with MD acknowledgement.        Charges Minutes   SLP treat    Swallow treat 38       05/23/2023  Mayeli Bornhorst, MS, CCC-SLP  [phone]

## 2023-05-23 NOTE — Nursing Progress Note (Signed)
Pt transferred from [facility]. On NGT running @ 39ml/ hr feeding rate changed right before pt's transfer to the unit. Per order the next NGT feeding rate change will be after 7 pm. BL mittens in place and assessed. 1:1 PSA in the room for safety.     Blood pressure 96/56, pulse 96, temperature 98.6 F (37 C), temperature source Oral, resp. rate 19, height 1.753 m (5\' 9" ), weight 72.1 kg (158 lb 15.2 oz), SpO2 91%.

## 2023-05-23 NOTE — H&P (Signed)
[facility]      Patient: Dillon Wood [facility]  Date: 05/20/2023   DOB: 25-Oct-1928  Date of Admission: 05/20/2023   MRN: [id number]  Attending: Gilman Buttner, MD       Chief Complaint   Patient presents with    Rectal Bleeding        History Gathered From: Chart.    HISTORY AND PHYSICAL     Dillon Wood is a 89 y.o. male with a history of peripheral vascular disease status post stent placement, dementia, prior history of DVT on anticoagulation, prior history of skin cancer s/p resection, who presented with lower GI bleed.  Apparently he is incontinent on baseline, requiring diapers.  He was noted to have dried blood in underwear for few days.  Patient had episode of bright red blood per rectum in the bathroom on the day of presentation, and then had a ground-level fall.  He was noted to have large volume BRBPR with large clot burden, hemoglobin 7.7.  CTA showed active hemorrhage in the sigmoid colon likely diverticular bleed.  Patient was given IV fluids, blood transfusion, Kcentra for apixaban reversal.  He then went to IR where no extravasation is noted.  He had empirical embolization of sigmoid branches of the IMA with single coil placed in superior rectal artery.  He was then admitted to the ICU, with GI consult.  He did require Levophed for a time which improved.  Hospital course also significant for agitation requiring Precedex.  He is now being referred to the medicine service for continued management.    Patient was confused and denies any ongoing fever, headache, chest pain, shortness of breath, abdominal pain, nausea, vomiting.  ROS limited due to confusion      Medical History[1]    Past Surgical History[2]    Prior to Admission medications    Medication Sig Start Date End Date Taking? Authorizing Provider   apixaban (ELIQUIS) 5 MG Take 1 tablet (5 mg) by mouth every 12 (twelve) hours    [provider]   aspirin EC 81 MG EC tablet Take 1 tablet (81 mg total) by mouth daily  03/29/21   Warnell Bureau, NP   atorvastatin (LIPITOR) 40 MG tablet Take 1 tablet (40 mg total) by mouth daily 04/21/21   Terrance Mass A, PA   desonide (DESOWEN) 0.05 % ointment Apply topically 2 (two) times daily Apply bid to scalp/face until reaction improved 08/16/22   Lily Lovings, MD   mupirocin Idelle Jo) 2 % ointment Apply bid to face/scalp lesions until healed 08/16/22   Lily Lovings, MD   niacinamide 500 MG tablet Take 1 tablet (500 mg) by mouth 2 (two) times daily with meals    [provider]   QUEtiapine (SEROquel) 25 MG tablet Take 1 tablet (25 mg) by mouth nightly 09/13/22 03/12/23  Carmie Kanner Fe Fe Inderio, NP   tamsulosin (FLOMAX) 0.4 MG Cap Take 1 capsule (0.4 mg) by mouth Daily after dinner    [provider]   triamcinolone (KENALOG) 0.1 % ointment Apply bid to eczema on lower body until clear, then use PRN 07/23/22   Lily Lovings, MD       Allergies[3]    Family History[4]    Social History[5]    REVIEW OF SYSTEMS     Pertinent positives and negatives are as stated in the HPI.  All other systems were reviewed and negative.    PHYSICAL EXAM     Vital Signs (most recent): BP  96/56   Pulse 96   Temp 98.6 F (37 C) (Oral)   Resp 19   Ht 1.753 m (5\' 9" )   Wt 72.1 kg (158 lb 15.2 oz)   SpO2 91%   BMI 23.47 kg/m   Constitutional: In no acute distress.  Looks appropriate for age.  Speaks in full sentences.   HEENT:  Normocephalic, atraumatic.  Pupils equal and reactive.  No scleral icterus.  Moist mucous membranes.  Oropharynx without erythema.  NG in place  Neck:  Trachea midline.  Neck supple.  No thyromegaly.  No cervical or supraclavicular adenopathy.  Cardiovascular:  Regular rhythm, normal rate.  Normal S1 S2, no S3 S4.  No murmurs or palpable thrills.  Peripheral pulses intact.  Respiratory:  No retractions or increased work of breathing.  Clear to auscultation bilaterally with no obvious wheezes, rhonchi or crackles.  Gastrointestinal:  Positive bowel  sounds.  Soft, non-tender, non-distended.  No rebound or guarding.  No obvious masses or hepatosplenomegaly.  Genitourinary:  No suprapubic or costovertebral angle tenderness.  External cath in place.  Musculoskeletal:  ROM and strength grossly normal in arms and legs.   Skin:  Warm and dry.  Pink color, good turgor.  No obvious lesions or rashes.  Appropriate color for ethnicity.   Extremities.  Symmetrial.  No significant edema.  No clubbing or cyanosis.  DP and radial pulses palpable and normal.  Capillary refill under 2 seconds.  Neurologic:  Moves all extremities with no obvious motor deficits  Psychiatric:  Alert but very confused, interactive.    LABS & IMAGING     Recent Results (from the past 24 hours)   CBC without Differential    Collection Time: 05/22/23  8:08 PM   Result Value Ref Range    WBC 18.71 (H) 3.10 - 9.50 x10 3/uL    Hemoglobin 9.6 (L) 12.5 - 17.1 g/dL    Hematocrit 13.2 (L) 37.6 - 49.6 %    Platelet Count 88 (L) 142 - 346 x10 3/uL    MPV 9.1 8.9 - 12.5 fL    RBC 3.11 (L) 4.20 - 5.90 x10 6/uL    MCV 90.4 78.0 - 96.0 fL    MCH 30.9 25.1 - 33.5 pg    MCHC 34.2 31.5 - 35.8 g/dL    RDW 14 11 - 15 %    nRBC % 0.0 <=0.0 /100 WBC    Absolute nRBC 0.00 <=0.00 x10 3/uL   CBC without Differential    Collection Time: 05/23/23  2:23 AM   Result Value Ref Range    WBC 14.60 (H) 3.10 - 9.50 x10 3/uL    Hemoglobin 8.3 (L) 12.5 - 17.1 g/dL    Hematocrit 44.0 (L) 37.6 - 49.6 %    Platelet Count 76 (L) 142 - 346 x10 3/uL    MPV 9.1 8.9 - 12.5 fL    RBC 2.73 (L) 4.20 - 5.90 x10 6/uL    MCV 90.8 78.0 - 96.0 fL    MCH 30.4 25.1 - 33.5 pg    MCHC 33.5 31.5 - 35.8 g/dL    RDW 15 11 - 15 %    nRBC % 0.0 <=0.0 /100 WBC    Absolute nRBC 0.00 <=0.00 x10 3/uL   PT/APTT    Collection Time: 05/23/23  4:49 AM   Result Value Ref Range    PT 17.4 (H) 10.1 - 12.9 sec    INR 1.5 0.9 - 1.1    PTT 26 (L)  27 - 39 sec   Basic Metabolic Panel    Collection Time: 05/23/23  6:35 AM   Result Value Ref Range    Glucose 94 70 - 100 mg/dL     BUN 16 9 - 28 mg/dL    Creatinine 1.3 0.5 - 1.5 mg/dL    Calcium 8.2 7.9 - 78.2 mg/dL    Sodium 956 213 - 086 mEq/L    Potassium 4.1 3.5 - 5.3 mEq/L    Chloride 115 (H) 99 - 111 mEq/L    CO2 18 17 - 29 mEq/L    Anion Gap 8.0 5.0 - 15.0    GFR 51.9 (L) >=60.0 mL/min/1.73 m2   Magnesium    Collection Time: 05/23/23  6:35 AM   Result Value Ref Range    Magnesium 1.9 1.6 - 2.6 mg/dL   CBC without Differential    Collection Time: 05/23/23  8:16 AM   Result Value Ref Range    WBC 13.59 (H) 3.10 - 9.50 x10 3/uL    Hemoglobin 8.3 (L) 12.5 - 17.1 g/dL    Hematocrit 57.8 (L) 37.6 - 49.6 %    Platelet Count 76 (L) 142 - 346 x10 3/uL    MPV 9.2 8.9 - 12.5 fL    RBC 2.65 (L) 4.20 - 5.90 x10 6/uL    MCV 91.7 78.0 - 96.0 fL    MCH 31.3 25.1 - 33.5 pg    MCHC 34.2 31.5 - 35.8 g/dL    RDW 15 11 - 15 %    nRBC % 0.0 <=0.0 /100 WBC    Absolute nRBC 0.00 <=0.00 x10 3/uL       MICROBIOLOGY:  Blood Culture: Negative  Urine Culture: Negative    IMAGING:  Upon my review:   XR Chest AP Portable    Result Date: 05/21/2023   Enteric tube extends into the distal stomach/pylorus.  Jaheim Leverington 05/21/2023 9:43 PM    Embolization Arterial General    Result Date: 05/21/2023   1. No active bleeding identified. 2. Empiric Gelfoam embolization of the sigmoid artery and its branches. 3. Empiric coil embolization of the superior rectal artery. Larrie Kass, MD 05/21/2023 11:15 AM    CT Angiogram Abdomen Pelvis    Result Date: 05/21/2023   1.Active hemorrhage within the sigmoid colon likely due to sigmoid diverticulosis. Recommend interventional radiology consultation to guide further management. 2. There is no definite evidence of acute inflammatory process. 3. Incidental note is made of gallstones, left renal calculi, prostate enlargement, and abnormal thickening of the urinary bladder raising concern for hypertrophy. 4. The possibility of bladder neoplasm cannot be excluded. Consider urology consultation to guide further management. 5. Other findings  as noted above. 6. Critical findings were discussed with, and read back by, Dr. Rennis Harding at the following time 05/21/2023 1:05 AM. Miguel Dibble, MD 05/21/2023 1:09 AM    XR Chest  AP Portable    Result Date: 05/21/2023  1.Diffuse interstitial prominence may be secondary to mild pulmonary edema. 2.Patchy airspace opacities in the lung bases may represent atelectasis or pneumonia versus underlying interstitial lung disease. Judd Gaudier, MD 05/21/2023 12:54 AM    CT Head without Contrast    Result Date: 05/21/2023   No acute intracranial abnormality. Vernie Murders, MD 05/21/2023 12:29 AM       CARDIAC:  Cardiac Markers:  Recent Labs   Lab 05/20/23  2313   hs Troponin <2.7       EMERGENCY DEPARTMENT COURSE:  Orders Placed  This Encounter   Procedures    Culture, Blood, Aerobic And Anaerobic    Culture, Methicillin Resistant Staphylococcus aureus (MRSA)    Culture, Methicillin Resistant Staphylococcus aureus (MRSA)    Culture, Urine    CT Head without Contrast    CT Angiogram Abdomen Pelvis    XR Chest  AP Portable    Embolization Arterial General    XR Chest AP Portable    CBC with Differential (Order)    PT/INR    APTT    Comprehensive Metabolic Panel    Lactic Acid    Lactic Acid    CBC with Differential (Component)    Type and Screen    High Sensitivity Troponin-I at 0 hrs    Blood Type Confirmation    Hemoglobin and Hematocrit    CBC without Differential    PT/APTT    Fibrinogen    Calcium, Ionized    Basic Metabolic Panel    Basic Metabolic Panel    Magnesium    Urinalysis with Microscopic Exam    Urine Wallace Cullens Culture Hold Tube    Urinalysis with Reflex to Microscopic Exam and Culture    Arterial Blood Gas    Fibrinogen    PT/APTT    Basic Metabolic Panel    Magnesium    CBC without Differential    Diet NPO effective now    Up as tolerated    Head of bed 30 degrees    Ambulate patient (With assistance)    OOB to chair as tolerated    Provide stimulation during the day    Sleep Hygiene Enhancement    Avoid Sensory  Deprivation    Ensure adequate intake of nutrition and fluids (hand feed  or assist as needed)    Notify physician (Critical Blood Glucose Value)    Notify physician (Communication: Document Abnormal Blood Glucose)    POCT order (PRN hypoglycemia)    Adult Hypoglycemia Treatment Algorithm    Vital signs (with SPO2)    Pain Assessment    Notify physician    Height and weight on Admission    Weight    Critical Care Mobility protocol    Nasal Cannula Low-Flow (5 lpm or Less)    Pain Assessment    Nursing to evaluate CAM ICU    Notify physician    Place sequential compression device    Maintain sequential compression device    Apply pressure    Patient Safety Associate (PSA) Observation and Remote Visual Monitroing (RVM) Request    Nasogastric tube insertion    CHG Bath    NO CPR - Support OK    ED Unit Sec Comm Order    Inpatient consult to gastroenterology    Inpatient consult to Palliative Care    OT eval and treat    PT evaluate and treat    SLP eval and treat    ECG 12 lead    ECG 12 lead    Prepare / Crossmatch Red Blood Cells:  One Unit, 1 Units    Prepare / Crossmatch Red Blood Cells:  One Unit, 1 Units    FFP - Product, 2 Units    RBC Emergency Release Uncrossmatched    Prepare / Crossmatch Red Blood Cells:  One Unit    FFP - Product    Prepare Cryoprecipitate    Platelets - Product    Initiate IV access    Admit to Inpatient    Transfer patient    Aspiration precautions  Fall precautions    IR Gastrointestinal Case Request    Restraints non-violent or non-self destructive       ASSESSMENT & PLAN     Talyn Duffner is a 89 y.o. male with a history of dementia, PVD, chronic anticoagulation due to prior DVT, admitted with GI bleed.    1.  Presenting lower GI bleed leading to acute blood loss anemia, felt to be due to diverticular bleed.  This is in the setting of taking chronic anticoagulation with apixaban.  Patient is status post IR procedure with embolization and coiling.  Continue to monitor for  signs of GI bleed.  Monitor serial hemoglobin/hematocrit.  The patient is still on empirical Protonix.  This is being followed by GI.    2.  Hypotension/shock on presentation likely hemorrhagic shock.  Hemodynamics has improved significantly.  Follow H&H and transfuse as needed to keep hemoglobin above 7.  Resume home blood pressure medications as appropriate in the next 1 to 2 days.    3.  Confusion on top of dementia.  This is felt to be metabolic encephalopathy as result of above.  He was on Precedex which was weaned to off.  He is on as needed Haldol.  His symptoms will be monitored.    4.  Dysphagia.  This is likely due to a combination of above, especially metabolic encephalopathy.  Unfortunately, he still needs NG tube to be in place for feeds.  Will need to discuss with family about alternative ways of nutrition if this were to continue.    5.  Incidental finding of urinary bladder wall thickening.  He does have an positive urinalysis but urine culture was negative indicating sterile pyuria.  This likely will need further evaluation by urology after discharge.    6.  Persistent thrombocytopenia.  Suspect this is consumptive in nature.  Platelet count has reached nadir.  Hopefully this will improve in the next 1 to 2 days.    7.  Persistent leukocytosis.  This is suspected to be reactive in nature.  Infectious workup including urine culture are negative.  He is being watched off of antibiotics    8.  Prior history of DVT.  He apparently uses apixaban due to history of DVT in 2022.  He does not need any further anticoagulation going forward.    9.  Stage IIIb chronic kidney disease.  Creatinine has been relatively stable and around baseline.  This will be followed and monitored.    10.  Other chronic medical conditions include peripheral vascular disease status post stent placement, hypertension, dyslipidemia.  These are stable.  Continue present management.    For VTE prophylaxis: SCDs.  Lovenox  contraindicated in light of presenting bleed as well as persistent thrombocytopenia.    Encourage mobilization.  Continue PT/OT evaluation.     Patient Active Hospital Problem List:       GI bleed     Date Noted: 05/21/2023                  Recent Labs     05/23/23  0816 05/23/23  0223 05/22/23  2008 05/22/23  1250 05/22/23  0305 05/21/23  2223 05/21/23  2004 05/21/23  1533 05/21/23  0905 05/21/23  0455 05/20/23  2313   Platelet Count 76* 76* 88* 82* 68* 70* 73* 69* 75* 84* 116*     Diagnosis: Moderate Thrombocytopenia             Recent Labs   Lab  05/23/23  0816 05/23/23  0223 05/22/23  2008   Hemoglobin 8.3* 8.3* 9.6*   Hematocrit 24.3* 24.8* 28.1*   MCV 91.7 90.8 90.4   WBC 13.59* 14.60* 18.71*   Platelet Count 76* 76* 88*         Anemia Diagnosis: Already documented in note       Nutrition: Tube feeding diet    DVT/VTE Prophylaxis:   Current Facility-Administered Medications (Includes Only Anticoagulants, Misc. Hematological)   Medication Dose Route Last Admin   None       Code Status: NO CPR - SUPPORT OK    Patient Class:  OBSERVATION.  INPATIENT.  Inpatient status is judged to be reasonable and necessary in order to provide the required intensity of service to ensure patient safety.  The patient's presenting symptoms, physical exam findings, and initial radiographic and laboratory data in the context of their chronic comorbidities is felt to place them at high risk for further clinical deterioration.  Furthermore, it is not anticipated that the patient will be medically stable for discharge from the hospital within 2 midnights of admission.  It is my clinical judgment that the patient will require inpatient hospital care spanning beyond 2 midnights from the point of admission due to high intensity of service, high risk for further deterioration and high frequency of surveillance required.     Anticipated medical stability for discharge: Greater than 48 Hours      Signed,  Gilman Buttner, MD    05/23/2023 3:37  PM  Time Elapsed: 75 minutes             [1]   Past Medical History:  Diagnosis Date    Closed fracture of left side of maxilla 04/03/2017    DVT (deep venous thrombosis)     Facial contusion, initial encounter 04/03/2017    Gastroesophageal reflux disease     Hyperlipemia     Left orbit fracture, closed, initial encounter 04/03/2017    PVD (peripheral vascular disease)    [2]   Past Surgical History:  Procedure Laterality Date    ABDOMINAL SURGERY      Hernia repair   [3]   Allergies  Allergen Reactions    Penicillins      Disorientation- per patient, no allergy as of 03/7023   [4]   Family History  Problem Relation Name Age of Onset    Heart disease Father      Stroke Father      Diabetes Maternal Aunt     [5]   Social History  Tobacco Use    Smoking status: Never     Passive exposure: Never    Smokeless tobacco: Never   Vaping Use    Vaping status: Never Used   Substance Use Topics    Alcohol use: Yes     Comment: 3 per week    Drug use: No

## 2023-05-24 ENCOUNTER — Encounter: Payer: Self-pay | Admitting: Interventional Radiology and Diagnostic Radiology

## 2023-05-24 LAB — PREPARE CRYOPRECIPITATE
Expiration Date: 202411261024
ISBT CODE: 5100

## 2023-05-24 LAB — CROSSMATCH PRBC, 1 UNIT
Expiration Date: 202412292359
Expiration Date: 202501012359
Expiration Date: 202501022359
Expiration Date: 202501022359
Expiration Date: 202501032359
Expiration Date: 202501032359
ISBT CODE: 1700
ISBT CODE: 1700
ISBT CODE: 7300
ISBT CODE: 7300
ISBT CODE: 7300
ISBT CODE: 7300
Product Status: TRANSFUSED
Product Status: TRANSFUSED

## 2023-05-24 LAB — PREPARE PLATELETS
Expiration Date: 202411282359
ISBT CODE: 5100

## 2023-05-24 LAB — PREPARE FRESH FROZEN PLASMA
Expiration Date: 202411302249
Expiration Date: 202411302249
Expiration Date: 202411302312
Expiration Date: 202411302312
Expiration Date: 202411302312
Expiration Date: 202411302312
ISBT CODE: 7300
ISBT CODE: 7300
ISBT CODE: 7300
ISBT CODE: 7300
ISBT CODE: 7300
ISBT CODE: 7300
Product Status: TRANSFUSED

## 2023-05-24 LAB — BASIC METABOLIC PANEL
Anion Gap: 5 (ref 5.0–15.0)
BUN: 20 mg/dL (ref 9–28)
CO2: 23 meq/L (ref 17–29)
Calcium: 7.8 mg/dL — ABNORMAL LOW (ref 7.9–10.2)
Chloride: 112 meq/L — ABNORMAL HIGH (ref 99–111)
Creatinine: 1.4 mg/dL (ref 0.5–1.5)
GFR: 47.4 mL/min/{1.73_m2} — ABNORMAL LOW (ref >=60.0)
Glucose: 127 mg/dL — ABNORMAL HIGH (ref 70–100)
Potassium: 4.2 meq/L (ref 3.5–5.3)
Sodium: 140 meq/L (ref 135–145)

## 2023-05-24 LAB — RBC EMERGENCY RELEASE UNCROSSMATCHED
Expiration Date: 202501032359
Expiration Date: 202501032359
Expiration Date: 202501042359
Expiration Date: 202501052359
ISBT CODE: 1700
ISBT CODE: 7300
ISBT CODE: 7300
ISBT CODE: 7300

## 2023-05-24 LAB — PT AND APTT
INR: 1.4 (ref 0.9–1.1)
PT: 15.9 s — ABNORMAL HIGH (ref 10.1–12.9)
PTT: 30 s (ref 27–39)

## 2023-05-24 LAB — CBC
Absolute nRBC: 0 10*3/uL (ref <=0.00)
Hematocrit: 23.9 % — ABNORMAL LOW (ref 37.6–49.6)
Hemoglobin: 8 g/dL — ABNORMAL LOW (ref 12.5–17.1)
MCH: 31.3 pg (ref 25.1–33.5)
MCHC: 33.5 g/dL (ref 31.5–35.8)
MCV: 93.4 fL (ref 78.0–96.0)
MPV: 9 fL (ref 8.9–12.5)
Platelet Count: 85 10*3/uL — ABNORMAL LOW (ref 142–346)
RBC: 2.56 10*6/uL — ABNORMAL LOW (ref 4.20–5.90)
RDW: 15 % (ref 11–15)
WBC: 11.35 10*3/uL — ABNORMAL HIGH (ref 3.10–9.50)
nRBC %: 0 /100{WBCs} (ref <=0.0)

## 2023-05-24 LAB — WHOLE BLOOD GLUCOSE POCT: Whole Blood Glucose POCT: 134 mg/dL — ABNORMAL HIGH (ref 70–100)

## 2023-05-24 LAB — MAGNESIUM: Magnesium: 2 mg/dL (ref 1.6–2.6)

## 2023-05-24 MED ORDER — OLANZAPINE 5 MG PO TABS
2.5000 mg | ORAL_TABLET | Freq: Two times a day (BID) | ORAL | Status: DC | PRN
Start: 2023-05-24 — End: 2023-05-28
  Administered 2023-05-25 – 2023-05-28 (×4): 2.5 mg via ORAL
  Filled 2023-05-24 (×4): qty 1

## 2023-05-24 NOTE — ACP (Advance Care Planning) (Signed)
Advanced Care Planning Documentation    In addition to the E&M services provided on this date, an additional 20 minutes were spent in face to face counseling with the patient and their spouse and daughter regarding advanced care planning issues.      Discussion:    I went through the patient's present condition as well as anticipated recovery from his delirium.  While unpredictable, his recovery from above conditions will likely be slow with complications such as generalized deconditioning, prolonged dysphagia.  I also went through potential scenarios addressing these.     Daughter [name] is not interested in prolonged life support, and would rather focus on the quality of life, including being at home with family.  However, she does need to discuss this with her siblings as well as her mother for a final decision.      Items Discussed:  Hospice -  Family is interested in taking patient home with hospice.      Actions taken:  Outpatient hospice referral      Next steps:    I did contact case management about arranging hospice.

## 2023-05-24 NOTE — Progress Notes (Addendum)
[facility]PITAL[facility]  PROGRESS NOTE      Patient: Dillon Wood [facility]  Date: 05/24/2023   LOS: 3 Days  Admission Date: 05/20/2023   MRN: [id number]  Attending: Gilman Buttner, MD  When on service as attending provider, please contact me on Epic Secure Chat from 7AM-7PM for non-urgent issues. For urgent matters use XTend page from 7AM-7PM.     Chief Complaint:  Chief Complaint   Patient presents with    Rectal Bleeding       History of Present Illness and Interval Summary:  Dillon Wood is a 89 y.o. male with a history of dementia, PVD, chronic anticoagulation due to prior DVT, admitted with GI bleed.  Course complicated due to delirium on dementia.    SUBJECTIVE   The patient was seen at the bedside this morning.  He was very lethargic, whereas he was alert and slightly agitated last night.  ROS not possible.  No other events reported from overnight.    OBJECTIVE     Vitals:    05/24/23 1200   BP:    Pulse:    Resp: 19   Temp:    SpO2:        Temperature: Temp  Min: 98.2 F (36.8 C)  Max: 100 F (37.8 C)  Pulse: Pulse  Min: 61  Max: 103  Respiratory: Resp  Min: 18  Max: 19  Non-Invasive BP: BP  Min: 95/51  Max: 103/62  Pulse Oximetry SpO2  Min: 90 %  Max: 95 %    Intake and Output Summary (Last 24 hours) at Date Time    Intake/Output Summary (Last 24 hours) at 05/24/2023 1508  Last data filed at 05/24/2023 0538  Gross per 24 hour   Intake 80 ml   Output 700 ml   Net -620 ml       GENERAL: In no acute distress  HEENT: Pupils equal and reactive; moist mucous membranes  CVS: Regular rate and rhythm, normal S1, S2; no murmurs, rubs, gallops  LUNGS: Clear to auscultation bilaterally; No wheezes, rhonchi, crackles  ABDOMEN: Soft; Non-tender, non-distended; positive bowel sounds  EXTREMITIES: No significant edema; peripheral pulses intact  SKIN: Warm and dry  NEURO: Lethargic    MEDICATIONS     Current Facility-Administered Medications   Medication Dose Route Frequency    aspirin  81 mg Oral Daily    atorvastatin   40 mg Oral Daily    lidocaine  1 patch Transdermal Q24H    melatonin  6 mg Oral QHS    pantoprazole  40 mg Intravenous Q12H    QUEtiapine  50 mg Oral QHS    senna-docusate  1 tablet Oral Q12H SCH    tamsulosin  0.4 mg Oral Daily after dinner       Allergies: Allergies[1]    LABS     Recent Labs   Lab 05/24/23  0555 05/23/23  1600 05/23/23  0816   WBC 11.35* 14.31* 13.59*   RBC 2.56* 2.86* 2.65*   Hemoglobin 8.0* 9.1* 8.3*   Hematocrit 23.9* 26.2* 24.3*   MCV 93.4 91.6 91.7   Platelet Count 85* 87* 76*       Recent Labs   Lab 05/24/23  0555 05/23/23  0635 05/22/23  0305 05/21/23  0455 05/20/23  2313   Sodium 140 141 139 137 139   Potassium 4.2 4.1 3.7 4.4 4.0   Chloride 112* 115* 113* 114* 108   CO2 23 18 21 18 22    BUN  20 16 14 21 23    Creatinine 1.4 1.3 1.1 1.3 1.6*   Glucose 127* 94 129* 179* 133*   Calcium 7.8* 8.2 8.0 7.3* 8.5   Magnesium 2.0 1.9 1.4*  --   --        Recent Labs   Lab 05/20/23  2313   ALT 14   AST (SGOT) 19   Bilirubin, Total 0.5   Albumin 2.9*   Alkaline Phosphatase 89       Recent Labs   Lab 05/20/23  2313   hs Troponin <2.7       Recent Labs   Lab 05/24/23  0555 05/23/23  0449 05/22/23  0305   INR 1.4 1.5 1.4   PT 15.9* 17.4* 15.3*   PTT 30 26* 27       Microbiology Results (last 15 days)       Procedure Component Value Units Date/Time    Stool Clostridioides difficile Toxin B, PCR [[id number]] Collected: 05/22/23 2120    Order Status: Canceled Specimen: Stool Updated: 05/22/23 2120    Culture, Urine [[id number]] Collected: 05/21/23 0908    Order Status: Completed Specimen: Urine, Clean Catch Updated: 05/22/23 1246     Culture Urine 1,000-9,000 CFU/mL Normal urogenital or skin microbiota     Comment: No further workup.       Culture, Methicillin Resistant Staphylococcus aureus (MRSA) [[id number]]  (Normal) Collected: 05/21/23 0629    Order Status: Completed Specimen: Swab from Nares Updated: 05/22/23 0722     Culture MRSA Surveillance No Methicillin Resistant Staphylococcus aureus isolated     Culture, Methicillin Resistant Staphylococcus aureus (MRSA) [[id number]]  (Normal) Collected: 05/21/23 0629    Order Status: Completed Specimen: Swab from Throat Updated: 05/22/23 0722     Culture MRSA Surveillance No Methicillin Resistant Staphylococcus aureus isolated    Culture, Blood, Aerobic And Anaerobic [[id number]] Collected: 05/20/23 2340    Order Status: Completed Specimen: Blood, Venous Updated: 05/24/23 0900     Culture Blood No growth at 3 days             RADIOLOGY     XR Chest AP Portable    Result Date: 05/21/2023   Enteric tube extends into the distal stomach/pylorus.  Marvin Cucinella 05/21/2023 9:43 PM    Embolization Arterial General    Result Date: 05/21/2023   1. No active bleeding identified. 2. Empiric Gelfoam embolization of the sigmoid artery and its branches. 3. Empiric coil embolization of the superior rectal artery. Larrie Kass, MD 05/21/2023 11:15 AM    CT Angiogram Abdomen Pelvis    Result Date: 05/21/2023   1.Active hemorrhage within the sigmoid colon likely due to sigmoid diverticulosis. Recommend interventional radiology consultation to guide further management. 2. There is no definite evidence of acute inflammatory process. 3. Incidental note is made of gallstones, left renal calculi, prostate enlargement, and abnormal thickening of the urinary bladder raising concern for hypertrophy. 4. The possibility of bladder neoplasm cannot be excluded. Consider urology consultation to guide further management. 5. Other findings as noted above. 6. Critical findings were discussed with, and read back by, Dr. Rennis Harding at the following time 05/21/2023 1:05 AM. Miguel Dibble, MD 05/21/2023 1:09 AM    XR Chest  AP Portable    Result Date: 05/21/2023  1.Diffuse interstitial prominence may be secondary to mild pulmonary edema. 2.Patchy airspace opacities in the lung bases may represent atelectasis or pneumonia versus underlying interstitial lung disease. Judd Gaudier, MD 05/21/2023 12:54 AM    CT Head without  Contrast    Result Date: 05/21/2023   No acute intracranial abnormality. Vernie Murders, MD 05/21/2023 12:29 AM     Echo Results       None            Results for orders placed or performed during the hospital encounter of 05/20/23   CT Head without Contrast    Narrative    HISTORY: Head trauma. Pain.    COMPARISON: 10/22/2021.    TECHNIQUE: CT of the head performed without intravenous contrast. The  following dose reduction techniques were utilized: automated exposure  control and/or adjustment of the mA and/or KV according to patient size,  and the use of an iterative reconstruction technique.    CONTRAST: None.    FINDINGS:  Stable volume loss and hypodensities in the white matter consistent with  chronic ischemic changes. No evidence of acute infarct. No mass effect. No  evidence of acute intracranial hemorrhage. No extra-axial collection is  seen.      Impression       No acute intracranial abnormality.    Vernie Murders, MD  05/21/2023 12:29 AM   Results for orders placed or performed in visit on 10/12/21   MRI brain with and without contrast    Narrative    HISTORY: Skin cancer, staging    COMPARISON: None    TECHNIQUE: MRI of the brain performed on a 3.0 Tesla scanner without and  with 15 mL of Clariscan intravenous contrast. Examination performed per  routine brain imaging protocol.     FINDINGS:   There are no abnormal fluid collections. There are no masses. There is  no mass effect or midline shift. Ventricles and sulci are  age-appropriate. Chronic ischemic changes are present.  Diffusion-weighted imaging is unremarkable. There is no evidence of  acute infarction. Scattered foci of hemosiderin deposition are present  secondary to chronic microhemorrhage. The gray-white matter junctions  are unremarkable. Clival and calvarial marrow signal is normal. Upon the  administration of intravenous contrast, there is no abnormal  enhancement.    There appears to be a 1.1 cm lesion in the right parotid gland  deep  lobe.          Impression        1. No acute intracranial process.  2. No mass, hydrocephalus, or pathologic fluid collection.  3. No acute infarct.  4. Chronic ischemic changes and age-appropriate volume are present.   5. There is a 1.1 cm lesion in the right parotid gland deep lobe. Its  imaging characteristics suggest a benign cyst however the lesion is  nonspecific.    Trilby Drummer, MD  10/12/2021 1:08 PM         CHART  REVIEW & DISCUSSION     The following chart items were reviewed as of 3:08 PM on 05/24/23:  [x]  Lab Results [x]  Imaging Results   []  Problem List  []  Current Orders [x]  Current Medications  []  Allergies  []  Code Status []  Previous Notes   []  SDoH    The management and plan of care for this patient was discussed with the following specialty consultants:  []  Cardiology  []  Gastroenterology                 []  Infectious Disease  []  Pulmonology []  Neurology                []  Nephrology  []  Neurosurgery []  Orthopedic Surgery  []  Heme/Onc  []  General Surgery []   Psychiatry                                   []  Palliative      ASSESSMENT/PLAN     1.  Presenting lower GI bleed leading to acute blood loss anemia, felt to be due to diverticular bleed.  This is in the setting of taking chronic anticoagulation with apixaban.  Patient is status post IR procedure with embolization and coiling.  Continue to monitor for signs of GI bleed.  Monitor serial hemoglobin/hematocrit.  The patient is still on empirical Protonix.  This is being followed by GI.     2.  Hypotension/shock on presentation likely hemorrhagic shock.  Hemodynamics has improved significantly.  Follow H&H and transfuse as needed to keep hemoglobin above 7.  Borderline BP noted and will be followed.  Consider a short course of hydrocortisone for CIRCI if BP frequently drops.     3.  Confusion on top of dementia.  This is felt to be metabolic encephalopathy as result of above.  He was on Precedex which was weaned off.  He is still on atypical  antipsychotics, which, after discussion with family, will reduce to allow for more alertness.  I did cut down his Seroquel and changed Zyprexia to PRN.     4.  Dysphagia.  This is likely due to a combination of above, especially metabolic encephalopathy.  Unfortunately, he still needs NG tube to be in place for feeds, which is being advanced toward goal.  Family is not interested in long term nutritional support such as PEG tube.    5.  Incidental finding of urinary bladder wall thickening.  He does have an positive urinalysis but urine culture was negative indicating sterile pyuria.  This may be further evaluated by urology as outpatient if so needed.     6.  Persistent thrombocytopenia.  Suspect this is consumptive in nature.  Platelet count has reached nadir and has been stable for 2 days.    7.  Persistent leukocytosis.  This is reactive in nature.  Infectious workup including urine culture are negative.  He is being watched off of antibiotics     8.  Prior history of DVT.  He apparently uses apixaban due to history of DVT in 2022.  He does not need any further anticoagulation.     9.  Stage IIIb chronic kidney disease.  Creatinine has been creeping up from baseline.  This will need to be monitored.     10.  Other chronic medical conditions include peripheral vascular disease status post stent placement, hypertension, dyslipidemia.  These are stable.  Continue present management.     11.  Goal of care.  I did have a prolonged discussion with patient's daughter [name] and his wife.  Family is leaning toward hospice but will need more discussion.  This is being followed by palliative care service.    For VTE prophylaxis: SCDs.  Lovenox contraindicated in light of presenting bleed.     Encourage mobilization.  Continue PT/OT evaluation.      Patient Active Hospital Problem List:       GI bleed     Date Noted: 05/21/2023                        Recent Labs     05/24/23  0555 05/23/23  1600 05/23/23  0816  05/23/23  1610  05/22/23  2008 05/22/23  1250 05/22/23  0305 05/21/23  2223 05/21/23  2004 05/21/23  1533   Platelet Count 85* 87* 76* 76* 88* 82* 68* 70* 73* 69*     Diagnosis: Moderate Thrombocytopenia             Recent Labs   Lab 05/24/23  0555 05/23/23  1600 05/23/23  0816   Hemoglobin 8.0* 9.1* 8.3*   Hematocrit 23.9* 26.2* 24.3*   MCV 93.4 91.6 91.7   WBC 11.35* 14.31* 13.59*   Platelet Count 85* 87* 76*         Anemia Diagnosis: Already documented in note    Cr Baseline Estimation (minimum in last 3 months): 1.1 mg/dL  Maximum Cr in last 36 hours: 1.4 mg/dL    Recent Labs (Last 3 Months)     05/24/23  0555 05/23/23  0635 05/22/23  0305   Creatinine 1.4 1.3 1.1   BUN 20 16 14        Acute Kidney Injury Diagnosis: Abnormal kidney function - workup in progress        DVT Prophylaxis:   Current Facility-Administered Medications (Includes Only Anticoagulants, Misc. Hematological)   Medication Dose Route Last Admin   None       Nutrition: Tube feeding diet               Code Status: NO CPR - SUPPORT OK    Dispo: ?Back to ALF with hospice?    Family Contact: [name] at [phone]         Joycelyn Rua, MD    05/24/2023 3:08 PM  Time Elapsed: 40 minutes           [1]   Allergies  Allergen Reactions    Penicillins      Disorientation- per patient, no allergy as of 03/7023

## 2023-05-24 NOTE — Progress Notes (Signed)
Rosina Lowenstein, MD    Dory Peru, NP    [address] [place], [place]    [place]   [phone]              Palliative Care Progress Note   Date Time: 05/24/23 8:41 AM   Patient Name: Dillon Wood, Dillon Wood   Location: [id number]   Attending Physician: Gilman Buttner, MD   Primary Care Physician: Marisa Sprinkles, MD   Consulting Provider: Dory Peru, NP   Consulting Service: Palliative Medicine        Advance Care Planning   05/24/23    CODE STATUS: NO CPR - SUPPORT OK   Medical Decision Maker: Advance directives: Health Care Agent:  [name] (Daughter):  [phone]  [name] (Daughter):  [phone]  Jarrion Burgeson (son):  [phone]        Patient's or Decision Maker's Perspective, Wishes, and Goals for Treatment  -- Patient had a previously signed POLST document from January 2024.  Daughters [name] and Research scientist (physical sciences) at bedside both report the patient does not have the capacity make his own decisions and was not making his own decisions back in January.  They do not know how these documents were signed, patient should not have signed him in the first place.  -- Family does have medical power of attorney documents, they will try to provide a copy to the hospital.  Both daughters [name] as well as son [name] all state the patient should be DNR/DNI and that this accurately reflects patient's wishes.  -- Patient is from [facility] facility.  Plan will be to return where he is then going to be moved to memory care.  -- Patient's wife also has dementia and is currently living with patient at the facility.  -- Patient has been mobile mostly with the use of an electric scooter but has been able to get OOB and walk short distances with assistance.  Has had worsening sundowning but patient is also very hard of hearing and this may be contributing.  -- Family is not ready for hospice services, family states that when patient is doing  well and at baseline he has good quality of life.  Hope is that this hospital stay is for the acute issue only and can then get him home.  -- Palliative care team remains available for continued ACP/GOC conversations        Time in  Time Out  Total Time mins spent on Advance Care Planning Services with voluntary consent from participants. No active management of the problems listed above was undertaken during the time  period reported.            Assessment  and Plan   [facility] 89 y.o. 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        AMS  Chronic Back Pain  Progressive Anorexia-Cachexia  Debility  Acute lower GI bleed secondary to sigmoid diverticulosis  --S/p Gelfoam embolization of the IMA and coil placement in superior rectal artery  Hemorrhagic shock  Dementia  Acute blood loss anemia  Protein energy malnutrition  Hospice eligible     Plan   Physical symptoms:     #AMS--persistent  -- Adequate oxygenation  -- Pain management  -- Correct anemia  -- Delirium precautions  -- Continue Zyprexa 2.5 mg p.o. twice  daily  -- Haldol 2.5 mg sublingual every 8 hours as needed moderate agitation/restless  -- Haldol 2.5 mg IV every 4 hours as needed severe agitation/restless  -- Hold Haldol for prolonged Qtc  -- Constipation prophylaxis     #Chronic Back Pain--no pain medication at home--controlled  Family requesting caution with opiates  -- lidocaine patch to back every 24 hours  -- Tylenol 1 g p.o. every 8 hours ATC  -- Ultram 50 mg p.o. every 6 hours as needed moderate pain  -- Oxycodone 5 mg p.o. every 4 hours as needed severe pain  -- Constipation prophylaxis     #Progressive Anorexia-Cachexia  -- SLP evaluation  -- Dietary/nutrition consult calorie optimization  -- Calorie dense meals  -- Pleasure feeds  -- Artificial nutrition  not recommended in setting of progressive dementia     #Progressive Debility  -- Pain management  -- Turn and reposition for comfort  -- PT/OT as medically appropriate      Psychosocial :      Patient supported by his wife, 4 children      Based on my assessment at time, estimated Prognosis: Weeks to months. Prognosis can change over time.       PC Team follow-up plans: tomorrow  Discharge Disposition: TBD  Outpatient Follow Up Recommended: Yes                                                                                                                                                                     Chief Complaint   F/u    Interval History   Patient currently lying in bed at time of visit.  NGT was placed for feedings.  Tolerating feedings at this time, continues on ATC Zyprexa as well as scheduled Seroquel.  Safety sitter at bedside.  No agitation or restlessness at time of visit.  Mittens are in place, oriented x 0-1.         Review of Systems   Review of Systems   Unable to perform ROS: Dementia               Medications   Scheduled Meds  Current Facility-Administered Medications   Medication Dose Route Frequency    aspirin  81 mg Oral Daily    atorvastatin  40 mg Oral Daily    lidocaine  1 patch Transdermal Q24H    melatonin  6 mg Oral QHS    OLANZapine  2.5 mg Oral Q12H SCH    pantoprazole  40 mg Intravenous Q12H    QUEtiapine  50 mg Oral Daily    QUEtiapine  50 mg Oral QHS    senna-docusate  1 tablet Oral Q12H SCH    tamsulosin  0.4 mg Oral Daily after dinner  DRIPS     PRN MEDS  PRN Medications[1]    Allergies   Allergies[2]    Physical Exam   BP 97/66   Pulse 61   Temp 98.6 F (37 C) (Oral)   Resp 19   Ht 1.753 m (5\' 9" )   Wt 75.6 kg (166 lb 10.7 oz)   SpO2 95%   BMI 24.61 kg/m    Physical Exam:  GENERAL: Ill-appearing, cachectic  EYES: Sclera anicteric. Conjunctivae pink.  ENT: Oral mucosa moist.  NECK: Trachea midline. Neck veins flat. No adenopathy.  HEART: RRR. Normal S1, S2. No  murmur appreciated.  CHEST: Breath sounds are diminished bilaterally.  Room air  ABDOMEN: Soft. Non-tender. Non-distended. BS+.  NGT  GU: Deferred.  RECTAL: Deferred.  MUSCULOSKELETAL: no joint tenderness, deformities or swelling  EXTREMITIES:No edema. No clubbing. No cyanosis.  SKIN: No rash or lesion.  NEURO: Awake, confused, oriented x 0-1, intermittently restless   Labs / Radiology   Lab and diagnostics: reviewed in Epic  Recent Labs   Lab 05/24/23  0555   WBC 11.35*   Hemoglobin 8.0*   Hematocrit 23.9*   Platelet Count 85*       Recent Labs   Lab 05/24/23  0555   PT 15.9*   INR 1.4   PTT 30        Recent Labs   Lab 05/24/23  0555   Sodium 140   Potassium 4.2   Chloride 112*   CO2 23   BUN 20   Creatinine 1.4   GFR 47.4*   Glucose 127*   Calcium 7.8*     Recent Labs   Lab 05/20/23  2313   Bilirubin, Total 0.5   Protein, Total 5.4*   Albumin 2.9*   ALT 14   AST (SGOT) 19          XR Chest AP Portable    Result Date: 05/21/2023   Enteric tube extends into the distal stomach/pylorus.  Dillon Wood 05/21/2023 9:43 PM    Embolization Arterial General    Result Date: 05/21/2023   1. No active bleeding identified. 2. Empiric Gelfoam embolization of the sigmoid artery and its branches. 3. Empiric coil embolization of the superior rectal artery. Dillon Kass, MD 05/21/2023 11:15 AM    CT Angiogram Abdomen Pelvis    Result Date: 05/21/2023   1.Active hemorrhage within the sigmoid colon likely due to sigmoid diverticulosis. Recommend interventional radiology consultation to guide further management. 2. There is no definite evidence of acute inflammatory process. 3. Incidental note is made of gallstones, left renal calculi, prostate enlargement, and abnormal thickening of the urinary bladder raising concern for hypertrophy. 4. The possibility of bladder neoplasm cannot be excluded. Consider urology consultation to guide further management. 5. Other findings as noted above. 6. Critical findings were discussed with, and read back  by, Dr. Rennis Harding at the following time 05/21/2023 1:05 AM. Dillon Dibble, MD 05/21/2023 1:09 AM    XR Chest  AP Portable    Result Date: 05/21/2023  1.Diffuse interstitial prominence may be secondary to mild pulmonary edema. 2.Patchy airspace opacities in the lung bases may represent atelectasis or pneumonia versus underlying interstitial lung disease. Dillon Gaudier, MD 05/21/2023 12:54 AM    CT Head without Contrast    Result Date: 05/21/2023   No acute intracranial abnormality. Dillon Murders, MD 05/21/2023 12:29 AM        Signed by: Dory Peru, NP  [facility]   [phone]             [  1]   Current Facility-Administered Medications   Medication Dose    acetaminophen  650 mg    dextrose  15 g of glucose    Or    dextrose  12.5 g    Or    dextrose  12.5 g    Or    glucagon (rDNA)  1 mg    haloperidol lactate  2.5 mg    magnesium sulfate  1 g    potassium & sodium phosphates  2 packet    potassium chloride  0-60 mEq    Or    potassium chloride  0-60 mEq    Or    potassium chloride  10 mEq   [2]   Allergies  Allergen Reactions    Penicillins      Disorientation- per patient, no allergy as of 03/7023

## 2023-05-24 NOTE — Plan of Care (Addendum)
NURSING SHIFT NOTE     Patient: [facility]  Day: 3      SHIFT EVENTS     Shift Narrative/Significant Events (PRN med administration, fall, RRT, etc.):     Pt is A&Ox4, PSA present on bedside. VSS except BP, soft. On RA SpO2 94%. Pt on continuous tube feeding Promote at 55 ml/hr, goal is 75 ml/hr; water flushes 30 ml q 4 hrs. Patient on mitten restraints. No significant event overnight. Plan of care discussed with patient; all concerns addressed.     Safety and fall precautions remain in place. Purposeful rounding completed.          ASSESSMENT     Changes in assessment from patient's baseline this shift:    Neuro: No  CV: No  Pulm: No  Peripheral Vascular: No  HEENT: No  GI: No  BM during shift: No, Last BM: Last BM Date: 05/23/23  GU: No   Integ: No  MS: No    Pain: None  Pain Interventions:   Medications Utilized:     Mobility: PMP Activity: Step 3 - Bed Mobility of Distance Walked (ft) (Step 6,7): 0 Feet           Lines     Patient Lines/Drains/Airways Status       Active Lines, Drains and Airways       Name Placement date Placement time Site Days    Peripheral IV 05/20/23 18 G Left Antecubital 05/20/23  2323  Antecubital  3    Peripheral IV 05/21/23 20 G Distal;Right Forearm 05/21/23  0600  Forearm  2    External Urinary Catheter 05/21/23  1700  --  2    Feeding Tube NG 12 Fr. Right nare 05/21/23  2100  Right nare  2                         VITAL SIGNS     Vitals:    05/24/23 0008   BP: 95/51   Pulse: (!) 103   Resp:    Temp: 100 F (37.8 C)   SpO2: 94%       Temp  Min: 97.8 F (36.6 C)  Max: 100 F (37.8 C)  Pulse  Min: 85  Max: 119  Resp  Min: 19  Max: 35  BP  Min: 86/49  Max: 112/81  SpO2  Min: 91 %  Max: 98 %      Intake/Output Summary (Last 24 hours) at 05/24/2023 0206  Last data filed at 05/23/2023 1500  Gross per 24 hour   Intake 120 ml   Output 750 ml   Net -630 ml            CARE PLAN     Problem: Moderate/High Fall Risk Score >5  Goal: Patient will remain free of falls  Outcome:  Progressing  Flowsheets (Taken 05/24/2023 0205)  High (Greater than 13):   HIGH-Consider use of low bed   HIGH-Initiate use of floor mats as appropriate   HIGH-Apply yellow "Fall Risk" arm band   HIGH-Bed alarm on at all times while patient in bed     Problem: Safety  Goal: Patient will be free from injury during hospitalization  Outcome: Progressing  Flowsheets (Taken 05/24/2023 0205)  Patient will be free from injury during hospitalization:   Provide and maintain safe environment   Use appropriate transfer methods   Ensure appropriate safety devices are available at the bedside  Include patient/ family/ care giver in decisions related to safety   Assess patient's risk for falls and implement fall prevention plan of care per policy   Hourly rounding   Assess for patients risk for elopement and implement Elopement Risk Plan per policy  Goal: Patient will be free from infection during hospitalization  Outcome: Progressing  Flowsheets (Taken 05/24/2023 0205)  Free from Infection during hospitalization:   Assess and monitor for signs and symptoms of infection   Monitor lab/diagnostic results     Problem: Pain  Goal: Pain at adequate level as identified by patient  Outcome: Progressing  Flowsheets (Taken 05/24/2023 0205)  Pain at adequate level as identified by patient:   Identify patient comfort function goal   Assess pain on admission, during daily assessment and/or before any "as needed" intervention(s)   Reassess pain within 30-60 minutes of any procedure/intervention, per Pain Assessment, Intervention, Reassessment (AIR) Cycle   Evaluate if patient comfort function goal is met   Evaluate patient's satisfaction with pain management progress   Offer non-pharmacological pain management interventions   Consult/collaborate with Pain Service     Problem: Altered GI Function  Goal: Fluid and electrolyte balance are achieved/maintained  Outcome: Progressing  Flowsheets (Taken 05/24/2023 0205)  Fluid and electrolyte  balance are achieved/maintained:   Monitor/assess lab values and report abnormal values   Assess and reassess fluid and electrolyte status   Observe for cardiac arrhythmias   Monitor for muscle weakness     Problem: Fluid and Electrolyte Imbalance/ Endocrine  Goal: Fluid and electrolyte balance are achieved/maintained  Outcome: Progressing  Flowsheets (Taken 05/24/2023 0205)  Fluid and electrolyte balance are achieved/maintained:   Monitor/assess lab values and report abnormal values   Assess and reassess fluid and electrolyte status   Observe for cardiac arrhythmias   Monitor for muscle weakness     Problem: Bleeding Precautions  Goal: Free from bleeding  Outcome: Progressing  Flowsheets (Taken 05/24/2023 0205)  Free from bleeding:   Monitor/assess site of invasive procedure for signs of bleeding   Avoid needle sticks when possible   Hold pressure/apply pressure dressing to any puncture sites   Avoid using a razor with blades for shaving   Provide dietary precautions   Monitor hemoglobin and hematocrit   Report signs of bleeding     Problem: Violent or Self-destructive Restraints Interdisciplinary Plan  Goal: Will be injury free during the use of restraints  Outcome: Progressing  Flowsheets (Taken 05/24/2023 0658)  Will be injury free during the use of restraints:   Attempt all alternatives before use of restraints   Notify family of initiation of restraints   Document observed patient actions according to protocol   Ensure safety devices are properly applied and maintained   Provide and maintain safe environment   Initiate least restrictive type of restraint that is effective   Include patient/family/caregiver in decisions related to safety   Nurse to accompany patient off unit when on restraints     Problem: Non-Violent Restraints Interdisciplinary Plan  Goal: Will be injury free during the use of non-violent restraints  Flowsheets (Taken 05/24/2023 [phone])  Will be injury free during the use of non-violent  restraints:   Attempt all alternatives before use of restraints   Initiate least restrictive type of restraint that is effective   Provide and maintain safe environment   Notify family of initiation of restraints   Include patient/family/caregiver in decisions related to safety   Ensure safety devices are properly applied and maintained  Document observed patient actions according to protocol   Remove restraints before the indicated maximum length of time when meets criteria for discontinuation   Provide debriefing as soon as possible and appropriate   Document significant changes in patient condition

## 2023-05-24 NOTE — Progress Notes (Signed)
PROGRESS NOTE  Gastroenterology Consult Service - [facility]  Pager ID: [phone]  Epic Chat (Group): [facility]  [address] [place], [place] [address]  Appointments: [phone]  Date Time: 05/24/23 7:32 AM  Patient Name: Dillon Wood  Requesting Physician: Gilman Buttner, MD      Assessment and Plan:   Assessment:  89 y.o. male  p/w painless hematochezia in the setting of eliquis use, weakness, fall found to have acute blood loss anemia w/ assoc hemorrhagic shock, +CTA for bleeding in the sigmoid s/p emergent embolization by IR 11/26.  H/H has downtrended since. No rectal bleeding overnight.    LGIB 2/2 sigmoid diverticulosis  Hematochezia   - +CTA, s/p IR embolization 11/26  Hemorrhagic Shock on pressor support  - resolved  Acute blood loss anemia  - s/p 2 units PRBCs 11/26, H/H stable  Dementia  - oropharyngeal dysphagia with NG tube feeds  DVT on eliquis (held)  Hx of colon polyps              Plan:  - Monitor for overt GI bleeding  - trend H/H, transfuse PRN  - Bleeding as resolved. No GI intervention planned at this time. Discussed with pts dtr on phone yesterday.       GI will sign off,re-consult as needed.  Case has been reviewed and discussed with the GI attending, Dr. Kathryne Gin, MD, and plan of care formulated together.      Subjective:   Sleeping.    Medications:     Current Facility-Administered Medications   Medication Dose Route Frequency    aspirin  81 mg Oral Daily    atorvastatin  40 mg Oral Daily    lidocaine  1 patch Transdermal Q24H    melatonin  6 mg Oral QHS    OLANZapine  2.5 mg Oral Q12H SCH    pantoprazole  40 mg Intravenous Q12H    QUEtiapine  50 mg Oral Daily    QUEtiapine  50 mg Oral QHS    senna-docusate  1 tablet Oral Q12H SCH    tamsulosin  0.4 mg Oral Daily after dinner         Physical Exam:     Vitals:    05/24/23 0600   BP:    Pulse:    Resp: 18   Temp:    SpO2:        General appearance: Sleeping  ENMT: mucous membranes moist, nose and ears appear normal.   Oropharynx clear.  Chest: Non labored respirations, no audible wheezing  Abdomen: soft, non-tender, non-distended  Skin: No pallor, no jaundice      Labs:     Recent Labs     05/24/23  0555 05/23/23  1600   WBC 11.35* 14.31*   Hemoglobin 8.0* 9.1*   Hematocrit 23.9* 26.2*   Platelet Count 85* 87*   MCV 93.4 91.6       Recent Labs     05/24/23  0555 05/23/23  0635   Sodium 140 141   Potassium 4.2 4.1   Chloride 112* 115*   CO2 23 18   BUN 20 16   Creatinine 1.4 1.3   Glucose 127* 94   Calcium 7.8* 8.2   Magnesium 2.0 1.9       No results for input(s): "AST", "ALT", "ALKPHOS", "BILITOTAL", "BILIDIRECT", "PROT", "ALB" in the last 72 hours.    Recent Labs     05/24/23  0555 05/23/23  0449   PTT 30 26*  PT 15.9* 17.4*   INR 1.4 1.5            Valorie Roosevelt, [place]

## 2023-05-24 NOTE — Progress Notes (Addendum)
LOS # 3      Summary of Discharge Plan: hospice at [facility]     MD notified CM that family in agreement to hospice and first plan is for Pt to go back to [facility] with hospice, and family states they believe [facility] will be in agreement with [facility] dispo.     MD to put in Hospice consult.     CM met with daughter of Pt Dillon Wood [phone] - asking if Pt can be seen by PT/OT prior to going on hospice. CM messaged MD   Plan A  Contact [facility] to see if they will accept Pt on hospice. Pending need for aides through medicaid and Pt has [employer] benefits that may provide aides. Ask [facility] if they have preferred hospice agency. Daughter to follow up and CM to follow-up. Pt currently has a Comptroller.     CM to talk to Delice Bison who is not in office until 11/30 after 11am [phone]    Plan B  Pt to [facility] on SNF with hospice.     Plan C  Pt to [facility] to daughters home on hospice/with Medicaid aides.         Identified Possible Discharge Barriers: medically stable.       CM Interventions and Outcome: coordination of safe  on hospice.       Discussed above Discharge Plan with (patient, family, Care Team, others):      Case Management will continue to follow on patient's discharge needs.       Milus Height, LMSW  [facility]  Case Manager Social Worker I

## 2023-05-24 NOTE — PT Eval Note (Signed)
.Physical Therapy Eval and Treatment  [name]      Post Acute Care Therapy Recommendations   Discharge Recommendations:  Home with supervision, Home with home health PT, Home with home health OT (D/C to memory care unit at [facility] with HHPT and HHOT)      DME needs IF patient is discharging home: No additional equipment/DME recommended at this time    Therapy discharge recommendations may change with patient status.  Please refer to most recent note for up-to-date recommendations.    Unit: [facility] [address]  Bed: A2106/A2106-A    ___________________________________________________    Time of Evaluation and Treatment:  Time Calculation   PT Received On: 05/24/23  Start Time: 0850  Stop Time: 0910  Time Calculation (min): 20 min       Evaluation Time: 10 minutes  Treatment Time: 10 minutes    Chart Review and Collaboration with Care Team: 3 minutes, not included in above time    PT Visit Number: 1    Consult received for Dillon Wood for PT Evaluation and Treatment.  Patient's medical condition is appropriate for Physical therapy intervention at this time.    Activity Orders:  PT eval and treat    Precautions and Contraindications:  Precautions  Weight Bearing Status: no restrictions  Aspiration Precautions: HOB at 30 degrees  Fall Risks: High, History of fall(s), Impaired balance/gait, Impaired mobility, Muscle weakness    Personal Protective Equipment (PPE)  gloves and procedure mask    Medical Diagnosis:  Acute blood loss anemia [D62]  GI bleed [K92.2]    History of Present Illness:  Dillon Wood is a 89 y.o. male admitted on 05/20/2023 with acute onset lower GI bleeding and ground-level fall in bathroom.  No history of GI bleeds in the past.  In ED patient was afebrile, heart rate 90, blood pressure 75/48, satting 96% on room air with no respiratory distress, labs notable for hemoglobin 9.1, platelets 116, creatinine 1.6, lactate 2.3, INR 2.0, PTT 22.  Electrolytes, LFTs,  and troponin within normal limits.  While in the ED patient had large-volume BRBPR with significant clot burden, repeat CBC with hemoglobin 7.7.  CXR with patchy airspace opacities, CT head negative for acute intracranial findings.  CTA abdomen pelvis with active hemorrhage in the sigmoid colon likely due to diverticulosis.  In ED patient received 2 L IVF, Kcentra for apixaban reversal, IV TXA, and 2 units PRBC with improvement in blood pressure, lactate normalized. Patient went emergently to IR with Dr. Excell Seltzer, who noted no active extravasation noted. Empiric Gelfoam embolization of sigmoid branches of the IMA performed. Single coil placed in the superior rectal artery as well. Hypotensive in IR suite. MTP activated. Admitted to ICU postprocedure.      Patient lives in retirement community with his wife.     Problem List[1]    Past Medical/Surgical History:  Medical History[2]  Past Surgical History[3]    X-Rays/Tests/Labs:  Lab Results   Component Value Date/Time    HGB 8.0 (L) 05/24/2023 05:55 AM    HGB 12.4 (L) 08/27/2022 10:40 AM    HCT 23.9 (L) 05/24/2023 05:55 AM    HCT 37.2 (L) 08/27/2022 10:40 AM    K 4.2 05/24/2023 05:55 AM    K 4.3 08/27/2022 10:40 AM    NA 140 05/24/2023 05:55 AM    NA 140 08/27/2022 10:40 AM    INR 1.4 05/24/2023 05:55 AM    INR 1.4 (H) 08/27/2022 10:40 AM  TROPI <2.7 05/20/2023 11:13 PM       All imaging reviewed, please see chart for details.    Social History:  Prior Level of Function  Prior level of function: Needs assistance with ADLs, Ambulates with assistive device, Up to chair with assistance  Assistive Device: Front wheel walker  Baseline Activity Level: No independent activity (amb short distances according to H&P)  Ambulated 100 feet or more prior to admission: No  Driving: does not drive  Cooking: No  DME Currently at Home:  (pt unable to state, lives in memory care?)    Home Living Arrangements  Living Arrangements: Family members ([facility])  Type of Home:  Facility  Home Layout: Engineer, structural, One level  Bathroom Shower/Tub: Pension scheme manager: Raised  DME Currently at Home:  (pt unable to state, lives in memory care?)  Home Living - Notes / Comments: according to notes: Patient's wife also has dementia and is currently living with patient at the facility.  -- Patient has been mobile mostly with the use of an electric scooter but has been able to get OOB and walk short distances with assistance.      Subjective:  Patient is agreeable to participation in the therapy session. Nursing clears patient for therapy.     Pain Assessment  Pain Assessment:  (denies pain when asked however occ during session said "ouch")      Objective:  Observation of Patient/Vital Signs:  Marland Kitchen  Vitals:    05/24/23 1000   BP:    Pulse:    Resp: 19   Temp:    SpO2:              Cognitive Status and Neuro Exam:  Cognition/Neuro Status  Arousal/Alertness: Delayed responses to stimuli  Attention Span: Difficulty dividing attention  Orientation Level: Disoriented to place;Disoriented to situation;Disoriented to time  Memory: Unable to assess  Following Commands: 25%  Safety Awareness: maximal verbal instruction  Insights: Educated in safety awareness  Problem Solving: maximum assistance  Behavior: anxious  Motor Planning: decreased processing speed;decreased initiation  Coordination: GMC impaired;FMC impaired    Musculoskeletal Examination  Gross ROM  Right Lower Extremity ROM: within functional limits  Left Lower Extremity ROM: within functional limits  Gross Strength  Right Lower Extremity Strength: 3/5 (grossly assessed)  Left Lower Extremity Strength: 3/5 (grossly assessed)  Tone  Tone:  (rigidity noted)    Functional Mobility:  Functional Mobility  Supine to Sit: Maximal Assist;Increased Time;Increased Effort;HOB raised;to Left  Scooting to HOB: Dependent  Scooting to EOB: Maximal Assist;Increased Time;Increased Effort  Sit to Supine: Maximal Assist  Transfers  Bed to Chair: Unable to  assess (Comment)  Locomotion  Ambulation: Unable to assess (Comment)     Balance  Balance  Sitting - Static: Fair (req CGA brief periods of time, mostly mod ast)  Standing - Static: Not tested    Participation and Activity Tolerance  Participation and Endurance  Participation Effort: fair  Endurance: Tolerates < 10 min exercise, no significant change in vital signs  [place] Dyspnea Scale: 0 Dyspnea         Educated the Patient to role of physical therapy, plan of care, goals of therapy and safety with mobility and ADLs, discharge instructions, home safety with limited indication of understanding.  Additional education required..    Patient left in bed with alarm and all other medical equipment in place and call bell and all personal items/needs within reach.  RN notified of  session outcome. Notified CM team and attending of d/c recommendation via secure chat.      Assessment:  Dillon Wood is a 88 y.o. male admitted 05/20/2023.  PT Assessment  Assessment: Decreased UE strength;Decreased LE strength;Decreased safety/judgement during functional mobility;Decreased cognition;Decreased endurance/activity tolerance;Impaired coordination;Impaired motor control;Decreased functional mobility;Gait impairment;Decreased balance  Prognosis: Fair;With continued PT status post acute discharge  Progress: Slow progress, cognitive deficits          Treatment:  Pt received supine in bed, asleep. Sitter present in room, mittens on hands bilaterally. Pt awoke to PT in room, able to answer a few questions, pt not an accurate historian. Pt tsf to EOB with ast as above. Pt able to sit EOB for about 5 minutes total. Pt reclined supine back to bed, HOB elevated as found. All needs in reach, bed alarm on.       Plan:  Treatment/Interventions: Exercise, Gait training, Neuromuscular re-education, Functional transfer training, LE strengthening/ROM, Endurance training, Cognitive reorientation, Patient/family training,  Equipment eval/education, Bed mobility  PT Frequency: 1-2x/wk  Risks/Benefits/POC Discussed with Pt/Family: Patient unable to participate in goal setting    PMP Activity: Step 4 - Dangle at Bedside  Distance Walked (ft) (Step 6,7): 0 Feet      Goals:  Goals  Goal Formulation: Patient unable to participate in goal setting  Time for Goal Acheivement: By time of discharge  Pt Will Go Supine To Sit: with supervision, to maximize functional mobility and independence  Pt Will Perform Sit to Stand: with supervision, to maximize functional mobility and independence  Pt Will Transfer Bed/Chair: with rolling walker, modified independent, to maximize functional mobility and independence  Pt Will Ambulate: 11-30 feet, with rolling walker, with contact guard assist, to maximize functional mobility and independence    Alanson Puls, PT,DPT  [id number]  05/24/2023 10:30 AM  Hours:W and F 8-430 PM; Th 9-530 PM      [facility]  Patient: Dillon Wood MRN#: [id number]  Unit: [facility] [address] Bed: A2106/A2106-A        [1]   Patient Active Problem List  Diagnosis    Traumatic subarachnoid hemorrhage without loss of consciousness, initial encounter    Squamous cell carcinoma of head and neck    Frequent falls    Carotid artery stenosis    Bladder stone    Memory loss    PVD (peripheral vascular disease)    Neuropathy    Acute deep vein thrombosis (DVT) of femoral vein of left lower extremity    Advanced directives, counseling/discussion    Open wound of plantar aspect of foot, left, sequela    Skin lesions    GI bleed   [2]   Past Medical History:  Diagnosis Date    Closed fracture of left side of maxilla 04/03/2017    DVT (deep venous thrombosis)     Facial contusion, initial encounter 04/03/2017    Gastroesophageal reflux disease     Hyperlipemia     Left orbit fracture, closed, initial encounter 04/03/2017    PVD (peripheral vascular disease)    [3]   Past Surgical History:  Procedure Laterality Date     ABDOMINAL SURGERY      Hernia repair    EMBOLIZATION ARTERIAL GENERAL N/A 05/21/2023    Procedure: EMBOLIZATION ARTERIAL GENERAL;  Surgeon: Verlee Rossetti, MD;  Location: [facility];  Service: Interventional Radiology;  Laterality: N/A;

## 2023-05-25 LAB — CBC
Absolute nRBC: 0 10*3/uL (ref <=0.00)
Hematocrit: 26.1 % — ABNORMAL LOW (ref 37.6–49.6)
Hemoglobin: 8.5 g/dL — ABNORMAL LOW (ref 12.5–17.1)
MCH: 30.7 pg (ref 25.1–33.5)
MCHC: 32.6 g/dL (ref 31.5–35.8)
MCV: 94.2 fL (ref 78.0–96.0)
MPV: 9.5 fL (ref 8.9–12.5)
Platelet Count: 101 10*3/uL — ABNORMAL LOW (ref 142–346)
RBC: 2.77 10*6/uL — ABNORMAL LOW (ref 4.20–5.90)
RDW: 15 % (ref 11–15)
WBC: 9.8 10*3/uL — ABNORMAL HIGH (ref 3.10–9.50)
nRBC %: 0 /100{WBCs} (ref <=0.0)

## 2023-05-25 LAB — BASIC METABOLIC PANEL
Anion Gap: 6 (ref 5.0–15.0)
BUN: 27 mg/dL (ref 9–28)
CO2: 22 meq/L (ref 17–29)
Calcium: 7.8 mg/dL — ABNORMAL LOW (ref 7.9–10.2)
Chloride: 111 meq/L (ref 99–111)
Creatinine: 1.3 mg/dL (ref 0.5–1.5)
GFR: 51.9 mL/min/{1.73_m2} — ABNORMAL LOW (ref >=60.0)
Glucose: 131 mg/dL — ABNORMAL HIGH (ref 70–100)
Potassium: 4.4 meq/L (ref 3.5–5.3)
Sodium: 139 meq/L (ref 135–145)

## 2023-05-25 LAB — MAGNESIUM: Magnesium: 1.9 mg/dL (ref 1.6–2.6)

## 2023-05-25 MED ORDER — HALOPERIDOL LACTATE 5 MG/ML IJ SOLN
1.0000 mg | Freq: Four times a day (QID) | INTRAMUSCULAR | Status: DC | PRN
Start: 2023-05-25 — End: 2023-05-26
  Administered 2023-05-25: 1 mg via INTRAMUSCULAR
  Filled 2023-05-25: qty 1

## 2023-05-25 MED ORDER — OXYCODONE HCL 5 MG PO TABS
2.5000 mg | ORAL_TABLET | Freq: Four times a day (QID) | ORAL | Status: DC | PRN
Start: 2023-05-25 — End: 2023-05-28

## 2023-05-25 MED ORDER — TRAMADOL HCL 50 MG PO TABS
50.0000 mg | ORAL_TABLET | Freq: Four times a day (QID) | ORAL | Status: DC | PRN
Start: 2023-05-25 — End: 2023-05-27
  Administered 2023-05-26: 50 mg via ORAL
  Filled 2023-05-25: qty 1

## 2023-05-25 MED ORDER — OXYCODONE HCL 5 MG PO TABS
5.0000 mg | ORAL_TABLET | ORAL | Status: DC | PRN
Start: 2023-05-25 — End: 2023-05-28
  Administered 2023-05-25 – 2023-05-28 (×2): 5 mg via ORAL
  Filled 2023-05-25 (×2): qty 1

## 2023-05-25 MED ORDER — MELATONIN 3 MG PO TABS
3.0000 mg | ORAL_TABLET | Freq: Every evening | ORAL | Status: DC
Start: 2023-05-25 — End: 2023-05-25

## 2023-05-25 MED ORDER — FIRST-LANSOPRAZOLE 3 MG/ML PO SUSP
30.0000 mg | ORAL | Status: DC
Start: 2023-05-25 — End: 2023-05-25

## 2023-05-25 MED ORDER — PANTOPRAZOLE ORAL SUSPENSION 40 MG/20 ML UD
40.0000 mg | Freq: Every day | ORAL | Status: DC
Start: 2023-05-25 — End: 2023-05-28
  Administered 2023-05-25 – 2023-05-28 (×3): 40 mg via ORAL
  Filled 2023-05-25 (×5): qty 20

## 2023-05-25 MED ORDER — QUETIAPINE FUMARATE 25 MG PO TABS
25.0000 mg | ORAL_TABLET | Freq: Every evening | ORAL | Status: DC
Start: 2023-05-25 — End: 2023-05-28
  Administered 2023-05-25 – 2023-05-27 (×3): 25 mg via ORAL
  Filled 2023-05-25 (×3): qty 1

## 2023-05-25 MED ORDER — MELATONIN 3 MG PO TABS
3.0000 mg | ORAL_TABLET | Freq: Every evening | ORAL | Status: DC | PRN
Start: 2023-05-25 — End: 2023-05-28

## 2023-05-25 MED ORDER — GABAPENTIN 100 MG PO CAPS
100.0000 mg | ORAL_CAPSULE | Freq: Three times a day (TID) | ORAL | Status: DC
Start: 2023-05-25 — End: 2023-05-28
  Administered 2023-05-25 – 2023-05-28 (×8): 100 mg via ORAL
  Filled 2023-05-25 (×8): qty 1

## 2023-05-25 NOTE — Progress Notes (Addendum)
[facility]PITAL[facility]  PROGRESS NOTE      Patient: Dillon Wood [facility]  Date: 05/25/2023   LOS: 4 Days  Admission Date: 05/20/2023   MRN: [id number]  Attending: Gilman Buttner, MD  When on service as attending provider, please contact me on Epic Secure Chat from 7AM-7PM for non-urgent issues. For urgent matters use XTend page from 7AM-7PM.     Chief Complaint:  Chief Complaint   Patient presents with    Rectal Bleeding       History of Present Illness and Interval Summary:  Dillon Wood is a 89 y.o. male with a history of dementia, PVD, chronic anticoagulation due to prior DVT, admitted with GI bleed.  Course complicated due to delirium on dementia.    SUBJECTIVE   The patient was seen at the bedside this morning.  He was very lethargic still, although per nursing he's more lucid when awake.  I reduced his sedative medications again.  ROS not possible due to lethargy.  No other events reported from overnight.    OBJECTIVE     Vitals:    05/25/23 1000   BP:    Pulse:    Resp: 18   Temp:    SpO2:        Temperature: Temp  Min: 97.5 F (36.4 C)  Max: 99 F (37.2 C)  Pulse: Pulse  Min: 85  Max: 102  Respiratory: Resp  Min: 18  Max: 19  Non-Invasive BP: BP  Min: 91/51  Max: 144/71  Pulse Oximetry SpO2  Min: 90 %  Max: 98 %    Intake and Output Summary (Last 24 hours) at Date Time    Intake/Output Summary (Last 24 hours) at 05/25/2023 1024  Last data filed at 05/25/2023 0900  Gross per 24 hour   Intake 129 ml   Output 2100 ml   Net -1971 ml     GENERAL: In no acute distress  HEENT: Pupils equal and reactive; moist mucous membranes  CVS: Regular rate and rhythm, normal S1, S2; no murmurs, rubs, gallops  LUNGS: Clear to auscultation bilaterally; No wheezes, rhonchi, crackles  ABDOMEN: Soft; Non-tender, non-distended; positive bowel sounds  EXTREMITIES: No significant edema; peripheral pulses intact  SKIN: Warm and dry  NEURO: Lethargic    MEDICATIONS     Current Facility-Administered Medications   Medication Dose  Route Frequency    aspirin  81 mg Oral Daily    atorvastatin  40 mg Oral Daily    lidocaine  1 patch Transdermal Q24H    pantoprazole  40 mg Intravenous Q12H    QUEtiapine  25 mg Oral QHS    senna-docusate  1 tablet Oral Q12H SCH    tamsulosin  0.4 mg Oral Daily after dinner       Allergies: Allergies[1]    LABS     Recent Labs   Lab 05/25/23  0407 05/24/23  0555 05/23/23  1600   WBC 9.80* 11.35* 14.31*   RBC 2.77* 2.56* 2.86*   Hemoglobin 8.5* 8.0* 9.1*   Hematocrit 26.1* 23.9* 26.2*   MCV 94.2 93.4 91.6   Platelet Count 101* 85* 87*       Recent Labs   Lab 05/25/23  0407 05/24/23  0555 05/23/23  0635 05/22/23  0305 05/21/23  0455   Sodium 139 140 141 139 137   Potassium 4.4 4.2 4.1 3.7 4.4   Chloride 111 112* 115* 113* 114*   CO2 22 23 18 21 18    BUN 27  20 16 14 21    Creatinine 1.3 1.4 1.3 1.1 1.3   Glucose 131* 127* 94 129* 179*   Calcium 7.8* 7.8* 8.2 8.0 7.3*   Magnesium 1.9 2.0 1.9 1.4*  --        Recent Labs   Lab 05/20/23  2313   ALT 14   AST (SGOT) 19   Bilirubin, Total 0.5   Albumin 2.9*   Alkaline Phosphatase 89       Recent Labs   Lab 05/20/23  2313   hs Troponin <2.7       Recent Labs   Lab 05/24/23  0555 05/23/23  0449 05/22/23  0305   INR 1.4 1.5 1.4   PT 15.9* 17.4* 15.3*   PTT 30 26* 27       Microbiology Results (last 15 days)       Procedure Component Value Units Date/Time    Stool Clostridioides difficile Toxin B, PCR [[id number]] Collected: 05/22/23 2120    Order Status: Canceled Specimen: Stool Updated: 05/22/23 2120    Culture, Urine [[id number]] Collected: 05/21/23 0908    Order Status: Completed Specimen: Urine, Clean Catch Updated: 05/22/23 1246     Culture Urine 1,000-9,000 CFU/mL Normal urogenital or skin microbiota     Comment: No further workup.       Culture, Methicillin Resistant Staphylococcus aureus (MRSA) [[id number]]  (Normal) Collected: 05/21/23 0629    Order Status: Completed Specimen: Swab from Nares Updated: 05/22/23 0722     Culture MRSA Surveillance No Methicillin Resistant  Staphylococcus aureus isolated    Culture, Methicillin Resistant Staphylococcus aureus (MRSA) [[id number]]  (Normal) Collected: 05/21/23 0629    Order Status: Completed Specimen: Swab from Throat Updated: 05/22/23 0722     Culture MRSA Surveillance No Methicillin Resistant Staphylococcus aureus isolated    Culture, Blood, Aerobic And Anaerobic [[id number]] Collected: 05/20/23 2340    Order Status: Completed Specimen: Blood, Venous Updated: 05/25/23 0900     Culture Blood No growth at 4 days             RADIOLOGY     XR Chest AP Portable    Result Date: 05/21/2023   Enteric tube extends into the distal stomach/pylorus.  Jorryn Casagrande 05/21/2023 9:43 PM    Embolization Arterial General    Result Date: 05/21/2023   1. No active bleeding identified. 2. Empiric Gelfoam embolization of the sigmoid artery and its branches. 3. Empiric coil embolization of the superior rectal artery. Larrie Kass, MD 05/21/2023 11:15 AM    CT Angiogram Abdomen Pelvis    Result Date: 05/21/2023   1.Active hemorrhage within the sigmoid colon likely due to sigmoid diverticulosis. Recommend interventional radiology consultation to guide further management. 2. There is no definite evidence of acute inflammatory process. 3. Incidental note is made of gallstones, left renal calculi, prostate enlargement, and abnormal thickening of the urinary bladder raising concern for hypertrophy. 4. The possibility of bladder neoplasm cannot be excluded. Consider urology consultation to guide further management. 5. Other findings as noted above. 6. Critical findings were discussed with, and read back by, Dr. Rennis Harding at the following time 05/21/2023 1:05 AM. Miguel Dibble, MD 05/21/2023 1:09 AM    XR Chest  AP Portable    Result Date: 05/21/2023  1.Diffuse interstitial prominence may be secondary to mild pulmonary edema. 2.Patchy airspace opacities in the lung bases may represent atelectasis or pneumonia versus underlying interstitial lung disease. Judd Gaudier, MD 05/21/2023  12:54 AM    CT Head without Contrast  Result Date: 05/21/2023   No acute intracranial abnormality. Vernie Murders, MD 05/21/2023 12:29 AM     Echo Results       None            Results for orders placed or performed during the hospital encounter of 05/20/23   CT Head without Contrast    Narrative    HISTORY: Head trauma. Pain.    COMPARISON: 10/22/2021.    TECHNIQUE: CT of the head performed without intravenous contrast. The  following dose reduction techniques were utilized: automated exposure  control and/or adjustment of the mA and/or KV according to patient size,  and the use of an iterative reconstruction technique.    CONTRAST: None.    FINDINGS:  Stable volume loss and hypodensities in the white matter consistent with  chronic ischemic changes. No evidence of acute infarct. No mass effect. No  evidence of acute intracranial hemorrhage. No extra-axial collection is  seen.      Impression       No acute intracranial abnormality.    Vernie Murders, MD  05/21/2023 12:29 AM   Results for orders placed or performed in visit on 10/12/21   MRI brain with and without contrast    Narrative    HISTORY: Skin cancer, staging    COMPARISON: None    TECHNIQUE: MRI of the brain performed on a 3.0 Tesla scanner without and  with 15 mL of Clariscan intravenous contrast. Examination performed per  routine brain imaging protocol.     FINDINGS:   There are no abnormal fluid collections. There are no masses. There is  no mass effect or midline shift. Ventricles and sulci are  age-appropriate. Chronic ischemic changes are present.  Diffusion-weighted imaging is unremarkable. There is no evidence of  acute infarction. Scattered foci of hemosiderin deposition are present  secondary to chronic microhemorrhage. The gray-white matter junctions  are unremarkable. Clival and calvarial marrow signal is normal. Upon the  administration of intravenous contrast, there is no abnormal  enhancement.    There appears to be a 1.1 cm lesion in the  right parotid gland deep  lobe.          Impression        1. No acute intracranial process.  2. No mass, hydrocephalus, or pathologic fluid collection.  3. No acute infarct.  4. Chronic ischemic changes and age-appropriate volume are present.   5. There is a 1.1 cm lesion in the right parotid gland deep lobe. Its  imaging characteristics suggest a benign cyst however the lesion is  nonspecific.    Trilby Drummer, MD  10/12/2021 1:08 PM         CHART  REVIEW & DISCUSSION     The following chart items were reviewed as of 10:24 AM on 05/25/23:  [x]  Lab Results [x]  Imaging Results   []  Problem List  [x]  Current Orders [x]  Current Medications  []  Allergies  []  Code Status [x]  Previous Notes   []  SDoH    The management and plan of care for this patient was discussed with the following specialty consultants:  []  Cardiology  []  Gastroenterology                 []  Infectious Disease  []  Pulmonology []  Neurology                []  Nephrology  []  Neurosurgery []  Orthopedic Surgery  []  Heme/Onc  []  General Surgery []  Psychiatry                                   []   Palliative      ASSESSMENT/PLAN     1.  Presenting lower GI bleed leading to acute blood loss anemia, felt to be due to diverticular bleed.  This is in the setting of taking chronic anticoagulation with apixaban.  Patient is status post IR procedure with embolization and coiling.  Continue to monitor for signs of GI bleed.  Monitor serial hemoglobin/hematocrit.  Protonix may be changed to PPI per NG.  This is being followed by GI.     2.  Hypotension/shock on presentation likely hemorrhagic shock.  Hemodynamics has improved significantly.  Follow H&H and transfuse as needed to keep hemoglobin above 7.  Borderline BP is starting to recover.  This will need to be followed.     3.  Delirium on top of moderate dementia.  He also has sundowning at home.  Due to excessive lethargy, I reduced atypical antipsychotics to home dose.  His mental status likely will wax and wane but  hopefully will be able to improve in the next 2-3 days.     4.  Dysphagia.  This is likely due to above, especially delireum.  He still needs NG tube to be in place for feeds, which is at goal.  Family is not interested in PEG tube.  Hopefully will be able to start diet once MS improves.  SLP is following.     5.  Incidental finding of urinary bladder wall thickening.  He had positive urinalysis but urine culture was negative indicating sterile pyuria.  This may be further evaluated by urology as outpatient if so needed.     6.  Persistent thrombocytopenia.  Suspect this is consumptive in nature.  Platelet count ius now improving.     7.  Persistent leukocytosis.  This is reactive in nature and improving.  Infectious workup including urine culture are negative.       8.  Prior history of DVT.  He apparently uses apixaban due to history of DVT in 2022.  He does not need any further anticoagulation.     9.  Stage IIIb chronic kidney disease.  Creatinine has been creeping up from baseline.  This will need to be monitored.     10.  Other chronic medical conditions include peripheral vascular disease status post stent placement, hypertension, dyslipidemia.  These are stable.  Continue present management.     11.  Goal of care.  Per discussion with patient's daughter [name] and his wife, they are now no longer leaning toward hospice but rather hopes the patient will improve.  Requested PT and OT to see if patient will respond.  Explained at length that therapy is only possible when the patient is lucid and able to follow commands, which may not be possible while his mental state waxes and wanes.  This is being followed by palliative care service.     For VTE prophylaxis: SCDs.  Lovenox contraindicated in light of presenting bleed.     Encourage mobilization.  Continue PT/OT evaluation.      Patient Active Hospital Problem List:       GI bleed     Date Noted: 05/21/2023                Recent Labs     05/25/23  0407  05/24/23  0555 05/23/23  1600 05/23/23  0816 05/23/23  0223 05/22/23  2008 05/22/23  1250   Platelet Count 101* 85* 87* 76* 76* 88* 82*     Diagnosis: Moderate Thrombocytopenia  Recent Labs   Lab 05/25/23  0407 05/24/23  0555 05/23/23  1600   Hemoglobin 8.5* 8.0* 9.1*   Hematocrit 26.1* 23.9* 26.2*   MCV 94.2 93.4 91.6   WBC 9.80* 11.35* 14.31*   Platelet Count 101* 85* 87*         Anemia Diagnosis: Already documented in note    Cr Baseline Estimation (minimum in last 3 months): 1.1 mg/dL  Maximum Cr in last 36 hours: 1.4 mg/dL    Recent Labs (Last 3 Months)     05/25/23  0407 05/24/23  0555 05/23/23  0635   Creatinine 1.3 1.4 1.3   BUN 27 20 16        Acute Kidney Injury Diagnosis: Abnormal kidney function - workup in progress        DVT Prophylaxis:   Current Facility-Administered Medications (Includes Only Anticoagulants, Misc. Hematological)   Medication Dose Route Last Admin   None       Nutrition: Tube feeding diet               Code Status: NO CPR - SUPPORT OK    Dispo: Back to ALF    Family Contact: [name] at [phone]; spoke to her at length yesterday and today         Signed,  Gilman Buttner, MD    05/25/2023 10:24 AM  Time Elapsed: 55 minutes           [1]   Allergies  Allergen Reactions    Penicillins      Disorientation- per patient, no allergy as of 03/7023

## 2023-05-25 NOTE — OT Eval Note (Signed)
Occupational Therapy Eval and Treatment [facility]        Post Acute Care Therapy Recommendations   Discharge Recommendations:  Home with home health OT (with 24 hour supervision and assistance)    If recommended discharge disposition is not available, patient will require the following assistance: 24 hour supervision, Assist with OOB activity, and Assist with I/ADLs    DME needs IF patient is discharging home: No additional equipment/DME recommended at this time    Therapy discharge recommendations may change with patient status.  Please refer to most recent note for up-to-date recommendations.    Unit: [facility] UN[address] 21  Bed: A2106/A2106-A      ___________________________________________________    Time of Evaluation and Treatment:  Time Calculation  OT Received On: 05/25/23  Start Time: 1357  Stop Time: 1416  Time Calculation (min): 19 min  Total Treatment Time (min): 19    Chart Review and Collaboration with Care Team: 25 minutes, not included in above time.    Evaluation: 10 minutes  Treatment:  9 minutes    OT Visit Number: 1    Consult received for Dillon Wood for OT Evaluation and Treatment.  Patient's medical condition is appropriate for Occupational therapy intervention at this time.    Activity Orders:  OT eval and treat    Precautions and Contraindications:  Precautions  Weight Bearing Status: no restrictions  Aspiration Precautions: HOB at 30 degrees  Fall Risks: High, Mental status change, Muscle weakness    Personal Protective Equipment (PPE)  gloves and procedure mask    Medical Diagnosis:  Acute blood loss anemia [D62]  GI bleed [K92.2]    History of Present Illness:  Dillon Wood is a 89 y.o. male admitted on 05/20/2023 with " history of peripheral vascular disease status post stent placement, dementia, prior history of DVT on anticoagulation, prior history of skin cancer s/p resection, who presented with lower GI bleed.  Apparently he is incontinent on  baseline, requiring diapers.  He was noted to have dried blood in underwear for few days.  Patient had episode of bright red blood per rectum in the bathroom on the day of presentation, and then had a ground-level fall.  He was noted to have large volume BRBPR with large clot burden, hemoglobin 7.7.  CTA showed active hemorrhage in the sigmoid colon likely diverticular bleed.  Patient was given IV fluids, blood transfusion, Kcentra for apixaban reversal.  He then went to IR where no extravasation is noted.  He had empirical embolization of sigmoid branches of the IMA with single coil placed in superior rectal artery.  He was then admitted to the ICU, with GI consult.  He did require Levophed for a time which improved.  Hospital course also significant for agitation requiring Precedex.  He is now being referred to the medicine service for continued management." (From H&P)      Problem List[1]     Past Medical/Surgical History:  Medical History[2]  Past Surgical History[3]     X-Rays/Tests/Labs  Lab Results   Component Value Date/Time    HGB 8.5 (L) 05/25/2023 04:07 AM    HGB 12.4 (L) 08/27/2022 10:40 AM    HCT 26.1 (L) 05/25/2023 04:07 AM    HCT 37.2 (L) 08/27/2022 10:40 AM    K 4.4 05/25/2023 04:07 AM    K 4.3 08/27/2022 10:40 AM    NA 139 05/25/2023 04:07 AM    NA 140 08/27/2022 10:40 AM    INR  1.4 05/24/2023 05:55 AM    INR 1.4 (H) 08/27/2022 10:40 AM    TROPI <2.7 05/20/2023 11:13 PM       All imaging reviewed. Please see chart for details      Social History:  Prior Level of Function  Prior level of function: Ambulates with assistive device (short distances with assistive device)  Assistive Device: Standard walker  Baseline Activity Level:  (amb short distances according to H&P)  Driving: does not drive  Cooking: No  DME Currently at Home:  (electric scooter per chart review)    Home Living Arrangements  Living Arrangements: Spouse/significant other  Type of Home: Facility ([facility], memory care)  Home  Layout: Engineer, structural, One level  Bathroom Shower/Tub: Pension scheme manager: Raised  DME Currently at Home:  (electric scooter per chart review)  Home Living - Notes / Comments: Per chart review, patient's wife also has dementia and is currently living with patient at the facility. -- Patient has been mobile mostly with the use of an electric scooter but has been able to get OOB and walk short distances with assistance.      Subjective:  Subjective: "Up here," pt indicated upper leg pain.   Family and/or guardian are agreeable to patient's participation in the therapy session. Patient is unable to indicate agreement for the therapy session but is able to participate in the selected activities. Nursing clears patient for therapy.   Patient Goal: To be comfortable.  Pain Assessment  Pain Assessment: Wong-Baker FACES  Wong-Baker FACES Pain Rating: Hurts even more  Pain Location: Leg  Pain Orientation: Upper, Anterior, Right, Left  Effect of Pain on Daily Activities: severe  Pain Intervention(s): Repositioned (RN notified)      Objective:      Cognitive Status and Neuro Exam:  Cognition/Neuro Status  Arousal/Alertness: Inconsistent responses to stimuli  Attention Span: Difficulty attending to directions  Orientation Level: Oriented to person, Disoriented to time, Disoriented to place, Disoriented to situation  Memory: Decreased recall of biographical information, Decreased recall of precautions, Decreased recall of recent events, Decreased long term memory, Decreased short term memory  Following Commands:  (Poor)  Safety Awareness: maximal verbal instruction (mittens & 1:1 sitter)  Insights: Not aware of deficits, Educated in safety awareness  Problem Solving: dependent  Behavior: perseverative, cooperative, distractable, inattentive, impulsive, anxious  Motor Planning: apraxia  Coordination: GMC impaired    Musculoskeletal Examination  Gross ROM  Right Upper Extremity ROM: within functional limits (reaches up to  scratch forehead)  Left Upper Extremity ROM: within functional limits (reaches up to scratch forehead)  Gross Strength  Right Upper Extremity Strength: unable to assess (does not follow commands to assess grip strength)  Left Upper Extremity Strength: unable to assess (does not follow commands to assess grip strength)  Tone  Tone:  (rigidity noted)    Sensory/Oculomotor Examination  Sensory  Auditory: impaired left, hearing aid left (pt's personal "pocket talker" donned.)  Tactile - Light Touch:  (unable to assess)  Visual Acuity:  (unable to assess)         Activities of Daily Living  Self-care and Home Management  Eating: NPO    Functional Mobility:  Mobility and Transfers  Rolling: Maximal Assist, to Left, to Right (1 step cues for sequencing (manual and verbal))  Scooting to [facility]: Dependent  Functional Mobility Deferred (Comment): d/t pain     Balance  Balance  Static Sitting Balance:  (unable to assess)    Participation and Activity  Tolerance  Participation and Endurance  Participation Effort: fair  Endurance: Tolerates < 10 min exercise, no significant change in vital signs  [place] Dyspnea Scale: 0 Dyspnea     Educated the Patient to role of occupational therapy, plan of care, goals of therapy and safety with mobility and ADLs with limited indication of understanding.  Additional education required..    Patient left in bed with mittens donned, HOB > 30, 1:1 sitter and spouse at bedside.  RN notified of session outcome. CM team and attending have been previously notified of d/c recommendation; no updates to d/c recommendation since that time.      Assessment:  Assante Laprairie is a 89 y.o. male admitted 05/20/2023. OT Assessment: decreased independence with ADLs;apraxia;sensory impairment;decreased safety awareness;decreased attention;decreased cognition    Pt seen for OT eval and tx this date. Pt received in bed, spouse at bedside, 1:1 sitter at bedside, bilat mittens donned, NG tube feed on  hold, HOB >30.   Pt AO x self. Pt perseverated on having mittens removed, BLE pain, and getting up to a chair but with poor attention to task, great difficulty following 1 step commands.  OT facilitated ADLs and functional mobility as noted above. Anticipate pt to require Max-Total A for self-cares at this time.   Pt is functioning somewhat below baseline. Pt may continue to benefit from acute OT services to maximize Ind and safety with ADLs and functional mobility and dec caregiver burden.     Rehabilitation Potential: Prognosis: Fair, With continued OT s/p acute discharge    Plan:  OT Frequency Recommended: 2-3x/wk   Treatment Interventions: ADL retraining, Functional transfer training, Patient/Family training, Equipment eval/education, Compensatory technique education, UE strengthening/ROM, Cognitive reorientation     PMP Activity: Step 3 - Bed Mobility  Distance Walked (ft) (Step 6,7): 0 Feet    Risks/benefits/POC discussed    Goals:  Time For Goal Achievement: by time of discharge  ADL Goals  Patient will groom self: Stand by Assist, at edge of bed  Patient will toilet: Moderate Assist  Mobility and Transfer Goals  Pt will transfer bed to [facility]: Moderate Assist        Jenkins Rouge, OTD, OTR/L  [place]   Physical Medicine and Rehabilitation   [facility]   [facility]     05/25/2023 2:54 PM    [facility]  Patient: Kaevion Root MRN#: [id number]   Unit: [facility]  UNIT 21 Bed: A2106/A2106-A           [1]   Patient Active Problem List  Diagnosis    Traumatic subarachnoid hemorrhage without loss of consciousness, initial encounter    Squamous cell carcinoma of head and neck    Frequent falls    Carotid artery stenosis    Bladder stone    Memory loss    PVD (peripheral vascular disease)    Neuropathy    Acute deep vein thrombosis (DVT) of femoral vein of left lower extremity    Advanced directives, counseling/discussion    Open wound of plantar aspect of foot,  left, sequela    Skin lesions    GI bleed   [2]   Past Medical History:  Diagnosis Date    Closed fracture of left side of maxilla 04/03/2017    DVT (deep venous thrombosis)     Facial contusion, initial encounter 04/03/2017    Gastroesophageal reflux disease     Hyperlipemia     Left orbit  fracture, closed, initial encounter 04/03/2017    PVD (peripheral vascular disease)    [3]   Past Surgical History:  Procedure Laterality Date    ABDOMINAL SURGERY      Hernia repair    EMBOLIZATION ARTERIAL GENERAL N/A 05/21/2023    Procedure: EMBOLIZATION ARTERIAL GENERAL;  Surgeon: Verlee Rossetti, MD;  Location: [facility];  Service: Interventional Radiology;  Laterality: N/A;

## 2023-05-25 NOTE — Progress Notes (Signed)
Dillon Lowenstein, MD    Dory Peru, NP    [address] [place], [place]    Mon-Sun   [phone]              Palliative Care Progress Note   Date Time: 05/25/23 12:24 PM   Patient Name: Dillon Wood, Dillon Wood   Location: U9811/B1478-G   Attending Physician: Gilman Buttner, MD   Primary Care Physician: Marisa Sprinkles, MD   Consulting Provider: Dory Peru, NP   Consulting Service: Palliative Medicine        Advance Care Planning   05/25/23    CODE STATUS: NO CPR - SUPPORT OK   Medical Decision Maker: Advance directives: Health Care Agent:  Dillon Wood (Daughter):  [phone]  Dillon Wood (Daughter):  [phone]  Rhyden Reeves (son):  [phone]        Patient's or Decision Maker's Perspective, Wishes, and Goals for Treatment  -- Patient had a previously signed POLST document from January 2024.  Daughters [name] and Research scientist (physical sciences) at bedside both report the patient does not have the capacity make his own decisions and was not making his own decisions back in January.  They do not know how these documents were signed, patient should not have signed him in the first place.  -- Family does have medical power of attorney documents, they will try to provide a copy to the hospital.  Both daughters [name] as well as son [name] all state the patient should be DNR/DNI and that this accurately reflects patient's wishes.  -- Patient is from [facility] facility.  Plan will be to return where he is then going to be moved to memory care.  -- Patient's wife also has dementia and is currently living with patient at the facility.  -- Patient has been mobile mostly with the use of an electric scooter but has been able to get OOB and walk short distances with assistance.  Has had worsening sundowning but patient is also very hard of hearing and this may be contributing.  -- Family is not ready for hospice services, family states that when patient is doing  well and at baseline he has good quality of life.  Hope is that this hospital stay is for the acute issue only and can then get him home.  -- Palliative care team remains available for continued ACP/GOC conversations        Time in  Time Out  Total Time mins spent on Advance Care Planning Services with voluntary consent from participants. No active management of the problems listed above was undertaken during the time  period reported.            Assessment  and Plan   Dillon Wood [facility] 89 y.o. 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        AMS  Chronic Back Pain  Progressive Anorexia-Cachexia  Debility  Acute lower GI bleed secondary to sigmoid diverticulosis  --S/p Gelfoam embolization of the IMA and coil placement in superior rectal artery  Hemorrhagic shock  Dementia  Acute blood loss anemia  Protein energy malnutrition  Hospice eligible     Plan   Physical symptoms:     #AMS--some improvement  -- Adequate oxygenation  -- Pain management  -- Correct anemia  -- Delirium precautions  -- ATC Zyprexa Cavalier'd by primary  team  -- Haldol 2.5 mg sublingual every 8 hours as needed moderate agitation/restless  -- Haldol 2.5 mg IV every 4 hours as needed severe agitation/restless  -- Hold Haldol for prolonged Qtc  -- Constipation prophylaxis     #Chronic Back Pain--no pain medication at home--controlled  Family requesting caution with opiates  -- lidocaine patch to back every 24 hours  -- Ultram 50 mg p.o. every 6 hours as needed moderate pain  -- Oxycodone 5 mg p.o. every 4 hours as needed severe pain  -- Constipation prophylaxis     #Progressive Anorexia-Cachexia  -- SLP evaluation  -- Dietary/nutrition consult calorie optimization  -- Calorie dense meals  -- Pleasure feeds  -- Artificial nutrition not recommended in setting of progressive  dementia       #Progressive Debility  -- Pain management  -- Turn and reposition for comfort  -- PT/OT as medically appropriate      Psychosocial :      Patient supported by his wife, 4 children      Based on my assessment at time, estimated Prognosis: Weeks to months. Prognosis can change over time.       PC Team follow-up plans: tomorrow  Discharge Disposition: TBD  Outpatient Follow Up Recommended: Yes                                                                                                                                                                     Chief Complaint   F/u    Interval History   Patient slightly more awake and interactive with decreased Zyprexa and Seroquel.  Remains oriented x 1, intermittently able to make needs known but remains overall confused.  Patient does report pain to his legs, also showing nonverbal signs of pain by putting his hands on his head.  Unable to rank or describe.  Only received Tylenol x 1 in the past 24 hours     Review of Systems   Review of Systems   Unable to perform ROS: Dementia               Medications   Scheduled Meds  Current Facility-Administered Medications   Medication Dose Route Frequency    aspirin  81 mg Oral Daily    atorvastatin  40 mg Oral Daily    lidocaine  1 patch Transdermal Q24H    pantoprazole  40 mg Oral Daily    QUEtiapine  25 mg Oral QHS    senna-docusate  1 tablet Oral Q12H SCH    tamsulosin  0.4 mg Oral Daily after dinner      DRIPS     PRN MEDS  PRN Medications[1]    Allergies   Allergies[2]    Physical Exam  BP 99/61   Pulse 78   Temp 97.7 F (36.5 C) (Oral)   Resp 19   Ht 1.753 m (5\' 9" )   Wt 72.1 kg (158 lb 15.2 oz)   SpO2 99%   BMI 23.47 kg/m    Physical Exam:  GENERAL: Ill-appearing, cachectic  EYES: Sclera anicteric. Conjunctivae pink.  ENT: Oral mucosa moist.  NECK: Trachea midline. Neck veins flat. No adenopathy.  HEART: RRR. Normal S1, S2. No murmur appreciated.  CHEST: Breath sounds are diminished bilaterally.   Room air  ABDOMEN: Soft. Non-tender. Non-distended. BS+.  NGT  GU: Deferred.  RECTAL: Deferred.  MUSCULOSKELETAL: no joint tenderness, deformities or swelling  EXTREMITIES:No edema. No clubbing. No cyanosis.  SKIN: No rash or lesion.  NEURO: Awake, confused, oriented x 1, intermittently restless   Labs / Radiology   Lab and diagnostics: reviewed in Epic  Recent Labs   Lab 05/25/23  0407   WBC 9.80*   Hemoglobin 8.5*   Hematocrit 26.1*   Platelet Count 101*       Recent Labs   Lab 05/24/23  0555   PT 15.9*   INR 1.4   PTT 30        Recent Labs   Lab 05/25/23  0407   Sodium 139   Potassium 4.4   Chloride 111   CO2 22   BUN 27   Creatinine 1.3   GFR 51.9*   Glucose 131*   Calcium 7.8*     Recent Labs   Lab 05/20/23  2313   Bilirubin, Total 0.5   Protein, Total 5.4*   Albumin 2.9*   ALT 14   AST (SGOT) 19          XR Chest AP Portable    Result Date: 05/21/2023   Enteric tube extends into the distal stomach/pylorus.  Mukul Krahmer 05/21/2023 9:43 PM    Embolization Arterial General    Result Date: 05/21/2023   1. No active bleeding identified. 2. Empiric Gelfoam embolization of the sigmoid artery and its branches. 3. Empiric coil embolization of the superior rectal artery. Larrie Kass, MD 05/21/2023 11:15 AM    CT Angiogram Abdomen Pelvis    Result Date: 05/21/2023   1.Active hemorrhage within the sigmoid colon likely due to sigmoid diverticulosis. Recommend interventional radiology consultation to guide further management. 2. There is no definite evidence of acute inflammatory process. 3. Incidental note is made of gallstones, left renal calculi, prostate enlargement, and abnormal thickening of the urinary bladder raising concern for hypertrophy. 4. The possibility of bladder neoplasm cannot be excluded. Consider urology consultation to guide further management. 5. Other findings as noted above. 6. Critical findings were discussed with, and read back by, Dr. Rennis Harding at the following time 05/21/2023 1:05 AM. Miguel Dibble, MD  05/21/2023 1:09 AM    XR Chest  AP Portable    Result Date: 05/21/2023  1.Diffuse interstitial prominence may be secondary to mild pulmonary edema. 2.Patchy airspace opacities in the lung bases may represent atelectasis or pneumonia versus underlying interstitial lung disease. Judd Gaudier, MD 05/21/2023 12:54 AM    CT Head without Contrast    Result Date: 05/21/2023   No acute intracranial abnormality. Vernie Murders, MD 05/21/2023 12:29 AM        Signed by: Dory Peru, NP  [facility]   [phone]             [1]   Current Facility-Administered Medications   Medication Dose  acetaminophen  650 mg    dextrose  15 g of glucose    Or    dextrose  12.5 g    Or    dextrose  12.5 g    Or    glucagon (rDNA)  1 mg    magnesium sulfate  1 g    melatonin  3 mg    OLANZapine  2.5 mg    potassium & sodium phosphates  2 packet    potassium chloride  0-60 mEq    Or    potassium chloride  0-60 mEq    Or    potassium chloride  10 mEq   [2]   Allergies  Allergen Reactions    Penicillins      Disorientation- per patient, no allergy as of 03/7023

## 2023-05-25 NOTE — Plan of Care (Signed)
NURSING SHIFT NOTE     Patient: Mcihael Bach [facility]  Day: 4      SHIFT EVENTS     Shift Narrative/Significant Events (PRN med administration, fall, RRT, etc.):   Pt is Aox1, agitated, confused, and clear on room air. Pt removed his NG tube today, Md was notified. Pt also in restraints mittens on bil hands for interfernce with treatment and equipment. Pt was extremely agitated and was given zyprexa followed by PRN haldol IM. Pt diet changed to thickened liquids. Safety and fall precautions remain in place. Purposeful rounding completed.          CARE PLAN     Problem: Moderate/High Fall Risk Score >5  Goal: Patient will remain free of falls  Outcome: Progressing     Problem: Safety  Goal: Patient will be free from injury during hospitalization  Outcome: Progressing  Goal: Patient will be free from infection during hospitalization  Outcome: Progressing     Problem: Altered GI Function  Goal: Fluid and electrolyte balance are achieved/maintained  Outcome: Progressing  Goal: Elimination patterns are normal or improving  Outcome: Progressing     Problem: Compromised Sensory Perception  Goal: Sensory Perception Interventions  Outcome: Progressing  Flowsheets (Taken 05/25/2023 0800)  Sensory Perception Interventions: Offload heels, Pad bony prominences, Reposition q 2hrs/turn Clock, Q2 hour skin assessment under devices if present     Problem: Fluid and Electrolyte Imbalance/ Endocrine  Goal: Fluid and electrolyte balance are achieved/maintained  Outcome: Progressing

## 2023-05-25 NOTE — SLP Progress Note (Signed)
[facility]  Speech Therapy Treatment Note    Patient:  Jung Bueso MRN#:  [id number]  Unit:  [place] UNIT 21 Room/Bed:  A2106/A2106-A      Time of treatment:   SLP Received On: 05/25/23  Start Time: 1400  Stop Time: 1420  Time Calculation (min): 20 min       Subjective: New order placed to see if pt may be able to advance his diet/have NG tube removed.  RN clears pt for therapy.  Wife and sitter at bedside.  Pt in bed with bilateral mitts in place, and NG tube in place.       Objective: Oropharyngeal analysis during PO trials in order to determine LRD,development and training of swallow strategies in order to improve swallow function and decrease risk of penetration/aspiration;  therapeutic feedings by SLP  and safe swallowing precautions education.       Assessment: Pt confused and intermittently agitated throughout assessment.  He was talking about objects not in the room and attempting to remove mitts.  Wife was present, but preoccupied that her phone was not working.  Pt refused all puree stating he does not like pudding or applesauce.  However, he accepted moderately thickened cranberry juice presented via teaspoon and by cup.  He consumed approximately 5 oz via single, controlled sips with no overt s/s of aspiration or penetration.  Pt required constant cues to attend to task.  Per chart review, pt tolerance/level of alertness appears to be inconsistent.  Communicated diet recommendations to RN, who authorized SLP to enter diet orders into Epic per hospital protocol. Will continue to monitor closely for diet tolerance/possible advancement.       Plan/Recommendations: Cautiously initiate full liquid   and Moderately thick MO3  Slow feeding rate, small bites / sips , upright position during and after the meals , Reflux precautions , and full assistance with meals   Stop if pt begins to cough/presents with a wet vocal quality.        Charges Minutes   SLP treat    Swallow treat 20        05/25/2023  Tor Netters, M.S., SLP  [employer] [phone]  Department #: [phone]  Speech Language Pathology

## 2023-05-25 NOTE — Plan of Care (Signed)
Problem: Safety  Goal: Patient will be free from injury during hospitalization  Outcome: Progressing  Flowsheets (Taken 05/25/2023 0020)  Patient will be free from injury during hospitalization:   Assess patient's risk for falls and implement fall prevention plan of care per policy   Provide and maintain safe environment   Use appropriate transfer methods   Ensure appropriate safety devices are available at the bedside   Include patient/ family/ care giver in decisions related to safety   Hourly rounding   Assess for patients risk for elopement and implement Elopement Risk Plan per policy   Provide alternative method of communication if needed (communication boards, writing)  Goal: Patient will be free from infection during hospitalization  Outcome: Progressing  Flowsheets (Taken 05/25/2023 0020)  Free from Infection during hospitalization:   Assess and monitor for signs and symptoms of infection   Monitor lab/diagnostic results   Monitor all insertion sites (i.e. indwelling lines, tubes, urinary catheters, and drains)   Encourage patient and family to use good hand hygiene technique     Problem: Inadequate Gas Exchange  Goal: Adequate oxygenation and improved ventilation  Outcome: Progressing  Flowsheets (Taken 05/25/2023 0020)  Adequate oxygenation and improved ventilation:   Assess lung sounds   Monitor SpO2 and treat as needed   Monitor and treat ETCO2   Provide mechanical and oxygen support to facilitate gas exchange   Position for maximum ventilatory efficiency   Teach/reinforce use of incentive spirometer 10 times per hour while awake, cough and deep breath as needed   Plan activities to conserve energy: plan rest periods   Increase activity as tolerated/progressive mobility   Consult/collaborate with Respiratory Therapy     Problem: Altered GI Function  Goal: Fluid and electrolyte balance are achieved/maintained  Outcome: Progressing  Flowsheets (Taken 05/25/2023 0020)  Fluid and electrolyte balance are  achieved/maintained:   Monitor/assess lab values and report abnormal values   Assess and reassess fluid and electrolyte status   Observe for cardiac arrhythmias   Monitor for muscle weakness  Goal: Elimination patterns are normal or improving  Outcome: Progressing  Flowsheets (Taken 05/25/2023 0020)  Elimination patterns are normal or improving: Assess for and discuss C. diff screening with LIP     Problem: Non-Violent Restraints Interdisciplinary Plan  Goal: Will be injury free during the use of non-violent restraints  Outcome: Progressing  Flowsheets (Taken 05/25/2023 0020)  Will be injury free during the use of non-violent restraints:   Attempt all alternatives before use of restraints   Initiate least restrictive type of restraint that is effective   Provide and maintain safe environment   Notify family of initiation of restraints   Include patient/family/caregiver in decisions related to safety   Ensure safety devices are properly applied and maintained   Document observed patient actions according to protocol   Nurse to accompany patient off unit when on restraints   Remove restraints before the indicated maximum length of time when meets criteria for discontinuation   Document significant changes in patient condition   Provide debriefing as soon as possible and appropriate   Reassess need for continued restraints   Ensure that order for restraints has not expired     Problem: Violent or Self-destructive Restraints Interdisciplinary Plan  Goal: Will be injury free during the use of restraints  Outcome: Progressing  Flowsheets (Taken 05/25/2023 0020)  Will be injury free during the use of restraints:   Attempt all alternatives before use of restraints   Initiate least restrictive type  of restraint that is effective   Provide and maintain safe environment   Notify family of initiation of restraints   Include patient/family/caregiver in decisions related to safety   Ensure safety devices are properly applied and  maintained   Document observed patient actions according to protocol   Nurse to accompany patient off unit when on restraints   Remove restraints before the indicated maximum length of time when meets criteria for discontinuation   Document significant changes in patient condition   Provide debriefing as soon as possible and appropriate   Reassess need for continued restraints   Ensure that order for restraints has not expired     Problem: Compromised Sensory Perception  Goal: Sensory Perception Interventions  Outcome: Progressing  Flowsheets (Taken 05/25/2023 0020)  Sensory Perception Interventions: Offload heels, Pad bony prominences, Reposition q 2hrs/turn Clock, Q2 hour skin assessment under devices if present     Problem: Compromised Moisture  Goal: Moisture level Interventions  Outcome: Progressing  Flowsheets (Taken 05/25/2023 0020)  Moisture level Interventions: Moisture wicking products, Moisture barrier cream     Problem: Compromised Activity/Mobility  Goal: Activity/Mobility Interventions  Outcome: Progressing  Flowsheets (Taken 05/25/2023 0020)  Activity/Mobility Interventions: Pad bony prominences, TAP Seated positioning system when OOB, Promote PMP, Reposition q 2 hrs / turn clock, Offload heels     Problem: Compromised Nutrition  Goal: Nutrition Interventions  Outcome: Progressing  Flowsheets (Taken 05/25/2023 0020)  Nutrition Interventions: Discuss nutrition at Rounds, I&Os, Document % meal eaten, Daily weights     Problem: Compromised Friction/Shear  Goal: Friction and Shear Interventions  Outcome: Progressing  Flowsheets (Taken 05/25/2023 0020)  Friction and Shear Interventions: Pad bony prominences, Off load heels, HOB 30 degrees or less unless contraindicated, Consider: TAP seated positioning, Heel foams     Problem: Fluid and Electrolyte Imbalance/ Endocrine  Goal: Fluid and electrolyte balance are achieved/maintained  Outcome: Progressing  Flowsheets (Taken 05/25/2023 0020)  Fluid and  electrolyte balance are achieved/maintained:   Monitor/assess lab values and report abnormal values   Assess and reassess fluid and electrolyte status   Observe for cardiac arrhythmias   Monitor for muscle weakness

## 2023-05-26 DIAGNOSIS — E875 Hyperkalemia: Secondary | ICD-10-CM

## 2023-05-26 LAB — CBC
Absolute nRBC: 0 10*3/uL (ref <=0.00)
Hematocrit: 27.3 % — ABNORMAL LOW (ref 37.6–49.6)
Hemoglobin: 9 g/dL — ABNORMAL LOW (ref 12.5–17.1)
MCH: 31.3 pg (ref 25.1–33.5)
MCHC: 33 g/dL (ref 31.5–35.8)
MCV: 94.8 fL (ref 78.0–96.0)
MPV: 9.3 fL (ref 8.9–12.5)
Platelet Count: 124 10*3/uL — ABNORMAL LOW (ref 142–346)
RBC: 2.88 10*6/uL — ABNORMAL LOW (ref 4.20–5.90)
RDW: 15 % (ref 11–15)
WBC: 7.02 10*3/uL (ref 3.10–9.50)
nRBC %: 0 /100{WBCs} (ref <=0.0)

## 2023-05-26 LAB — ECG 12-LEAD
Atrial Rate: 103 {beats}/min
P Axis: 74 degrees
P-R Interval: 216 ms
Q-T Interval: 346 ms
QRS Duration: 114 ms
QTC Calculation (Bezet): 453 ms
R Axis: -80 degrees
T Axis: -8 degrees
Ventricular Rate: 103 {beats}/min

## 2023-05-26 LAB — BASIC METABOLIC PANEL
Anion Gap: 9 (ref 5.0–15.0)
BUN: 29 mg/dL — ABNORMAL HIGH (ref 9–28)
CO2: 22 meq/L (ref 17–29)
Calcium: 8.3 mg/dL (ref 7.9–10.2)
Chloride: 111 meq/L (ref 99–111)
Creatinine: 1.3 mg/dL (ref 0.5–1.5)
GFR: 51.9 mL/min/{1.73_m2} — ABNORMAL LOW (ref >=60.0)
Glucose: 117 mg/dL — ABNORMAL HIGH (ref 70–100)
Potassium: 5.8 meq/L — ABNORMAL HIGH (ref 3.5–5.3)
Sodium: 142 meq/L (ref 135–145)

## 2023-05-26 LAB — POTASSIUM: Potassium: 4.5 meq/L (ref 3.5–5.3)

## 2023-05-26 LAB — MAGNESIUM: Magnesium: 2.1 mg/dL (ref 1.6–2.6)

## 2023-05-26 LAB — CULTURE BLOOD AEROBIC AND ANAEROBIC: Culture Blood: NO GROWTH

## 2023-05-26 MED ORDER — CALCIUM GLUCONATE-NACL 1-0.675 GM/50ML-% IV SOLN
1.0000 g | Freq: Once | INTRAVENOUS | Status: AC
Start: 2023-05-26 — End: 2023-05-26
  Administered 2023-05-26: 1 g via INTRAVENOUS
  Filled 2023-05-26: qty 50

## 2023-05-26 MED ORDER — HALOPERIDOL LACTATE 2 MG/ML PO CONC
1.0000 mg | Freq: Three times a day (TID) | ORAL | Status: DC | PRN
Start: 2023-05-26 — End: 2023-05-28

## 2023-05-26 MED ORDER — HALOPERIDOL LACTATE 5 MG/ML IJ SOLN
1.0000 mg | INTRAMUSCULAR | Status: DC | PRN
Start: 2023-05-26 — End: 2023-05-28
  Administered 2023-05-27 – 2023-05-28 (×2): 1 mg via INTRAVENOUS
  Filled 2023-05-26 (×2): qty 1

## 2023-05-26 MED ORDER — CALCIUM GLUCONATE 10 % IV SOLN
1.0000 g | Freq: Once | INTRAVENOUS | Status: DC
Start: 2023-05-26 — End: 2023-05-26

## 2023-05-26 NOTE — Plan of Care (Addendum)
NURSING SHIFT NOTE     Patient: Dillon Wood [facility]  Day: 5      SHIFT EVENTS     Shift Narrative/Significant Events (PRN med administration, fall, RRT, etc.):     Pt is alert and oriented to self, confused, agitated at times, not following commands; sitter and bilateral mitt restrains in place for safety (pt is trying to pull IV lines, climb out of bed, unable to redirect); family at the bedside, aware of the situation, education of continuation of restrains provided to Pt and family. Pt has improved appetite today, repositioning and personal hygiene provided.   Safety and fall precautions remain in place. Purposeful rounding completed.          ASSESSMENT     Changes in assessment from patient's baseline this shift:    Neuro: No  CV: No  Pulm: No  Peripheral Vascular: No  HEENT: No  GI: No  BM during shift: Yes 1 , Last BM: Last BM Date: 05/26/23  GU: No   Integ: No  MS: No    Pain: No change  Pain Interventions: Medications  Medications Utilized: tramadol PO    Mobility: PMP Activity: Step 3 - Bed Mobility of Distance Walked (ft) (Step 6,7): 0 Feet           Lines     Patient Lines/Drains/Airways Status       Active Lines, Drains and Airways       Name Placement date Placement time Site Days    Peripheral IV 05/20/23 18 G Left Antecubital 05/20/23  2323  Antecubital  5    External Urinary Catheter 05/26/23  1100  --  less than 1                         VITAL SIGNS     Vitals:    05/26/23 1800   BP:    Pulse:    Resp: 19   Temp:    SpO2:        Temp  Min: 97.7 F (36.5 C)  Max: 98.8 F (37.1 C)  Pulse  Min: 67  Max: 123  Resp  Min: 18  Max: 19  BP  Min: 92/60  Max: 152/72  SpO2  Min: 80 %  Max: 94 %      Intake/Output Summary (Last 24 hours) at 05/26/2023 1850  Last data filed at 05/26/2023 1800  Gross per 24 hour   Intake 660 ml   Output 1550 ml   Net -890 ml                CARE PLAN        Problem: Safety  Goal: Patient will be free from injury during hospitalization  Outcome: Progressing  Flowsheets (Taken  05/25/2023 2359 by Elby Beck, Faith, RN)  Patient will be free from injury during hospitalization:   Assess patient's risk for falls and implement fall prevention plan of care per policy   Provide and maintain safe environment   Use appropriate transfer methods   Ensure appropriate safety devices are available at the bedside   Include patient/ family/ care giver in decisions related to safety   Hourly rounding   Assess for patients risk for elopement and implement Elopement Risk Plan per policy   Provide alternative method of communication if needed (communication boards, writing)  Goal: Patient will be free from infection during hospitalization  Outcome: Progressing  Flowsheets (Taken 05/25/2023 2359 by Annette Stable  Valda Lamb, RN)  Free from Infection during hospitalization:   Assess and monitor for signs and symptoms of infection   Monitor lab/diagnostic results   Monitor all insertion sites (i.e. indwelling lines, tubes, urinary catheters, and drains)   Encourage patient and family to use good hand hygiene technique     Problem: Safety  Goal: Patient will be free from infection during hospitalization  Outcome: Progressing  Flowsheets (Taken 05/25/2023 2359 by Elby Beck, Faith, RN)  Free from Infection during hospitalization:   Assess and monitor for signs and symptoms of infection   Monitor lab/diagnostic results   Monitor all insertion sites (i.e. indwelling lines, tubes, urinary catheters, and drains)   Encourage patient and family to use good hand hygiene technique     Problem: Pain  Goal: Pain at adequate level as identified by patient  Outcome: Progressing  Flowsheets (Taken 05/25/2023 2359 by Elby Beck, Faith, RN)  Pain at adequate level as identified by patient:   Identify patient comfort function goal   Assess for risk of opioid induced respiratory depression, including snoring/sleep apnea. Alert healthcare team of risk factors identified.   Assess pain on admission, during daily assessment and/or before any  "as needed" intervention(s)   Reassess pain within 30-60 minutes of any procedure/intervention, per Pain Assessment, Intervention, Reassessment (AIR) Cycle   Evaluate if patient comfort function goal is met   Evaluate patient's satisfaction with pain management progress   Offer non-pharmacological pain management interventions   Consult/collaborate with Pain Service   Consult/collaborate with Physical Therapy, Occupational Therapy, and/or Speech Therapy   Include patient/patient care companion in decisions related to pain management as needed     Problem: Non-Violent Restraints Interdisciplinary Plan  Goal: Will be injury free during the use of non-violent restraints  Outcome: Progressing  Flowsheets (Taken 05/25/2023 2359 by Elby Beck, Faith, RN)  Will be injury free during the use of non-violent restraints:   Attempt all alternatives before use of restraints   Initiate least restrictive type of restraint that is effective   Provide and maintain safe environment   Notify family of initiation of restraints   Include patient/family/caregiver in decisions related to safety   Ensure safety devices are properly applied and maintained   Document observed patient actions according to protocol   Nurse to accompany patient off unit when on restraints   Remove restraints before the indicated maximum length of time when meets criteria for discontinuation   Document significant changes in patient condition   Provide debriefing as soon as possible and appropriate   Reassess need for continued restraints   Ensure that order for restraints has not expired     Problem: Bleeding Precautions  Goal: Free from bleeding  Outcome: Progressing  Flowsheets (Taken 05/26/2023 1631)  Free from bleeding:   Monitor/assess site of invasive procedure for signs of bleeding   Hold pressure/apply pressure dressing to any puncture sites   Avoid using a razor with blades for shaving   Avoid needle sticks when possible   Monitor sites/gums and oral  cavity for bleeding   Provide dietary precautions   Monitor hemoglobin and hematocrit   Report signs of bleeding     Problem: Pain interferes with ability to perform ADL  Goal: Pain at adequate level as identified by patient  Outcome: Progressing  Flowsheets (Taken 05/25/2023 2359 by Elby Beck, Faith, RN)  Pain at adequate level as identified by patient:   Identify patient comfort function goal   Assess for risk of opioid  induced respiratory depression, including snoring/sleep apnea. Alert healthcare team of risk factors identified.   Assess pain on admission, during daily assessment and/or before any "as needed" intervention(s)   Reassess pain within 30-60 minutes of any procedure/intervention, per Pain Assessment, Intervention, Reassessment (AIR) Cycle   Evaluate if patient comfort function goal is met   Evaluate patient's satisfaction with pain management progress   Offer non-pharmacological pain management interventions   Consult/collaborate with Pain Service   Consult/collaborate with Physical Therapy, Occupational Therapy, and/or Speech Therapy   Include patient/patient care companion in decisions related to pain management as needed

## 2023-05-26 NOTE — Plan of Care (Addendum)
NURSING SHIFT NOTE     Patient: Dillon Wood [facility]  Day: 5      SHIFT EVENTS     Shift Narrative/Significant Events (PRN med administration, fall, RRT, etc.):   Patient is A&O*1, confused, mildly agitated, has a sitter, on room air making adequate O2 saturation, denies pain , ext cath insitu making adequate urine output, mittens restraints on bil hands for interference of care, meds served crushed with applesauce, tolerated well, personal hygiene maintained, mobile in bed.  Safety and fall precautions remain in place. Purposeful rounding completed.          ASSESSMENT     Changes in assessment from patient's baseline this shift:    Neuro: No  CV: No  Pulm: No  Peripheral Vascular: No  HEENT: No  GI: No  BM during shift: No, Last BM: Last BM Date: 05/26/23  GU: No   Integ: No  MS: No  Pain: Improved  Pain Interventions: Medications and Rest  Medications Utilized:   Mobility: PMP Activity: Step 3 - Bed Mobility of Distance Walked (ft) (Step 6,7): 0 Feet       Lines     Patient Lines/Drains/Airways Status       Active Lines, Drains and Airways       Name Placement date Placement time Site Days    Peripheral IV 05/20/23 18 G Left Antecubital 05/20/23  2323  Antecubital  6    External Urinary Catheter 05/26/23  1100  --  less than 1                     VITAL SIGNS     Vitals:    05/26/23 1948   BP: 92/55   Pulse: 95   Resp: 19   Temp: 98.8 F (37.1 C)   SpO2: 94%       Temp  Min: 97.7 F (36.5 C)  Max: 98.8 F (37.1 C)  Pulse  Min: 84  Max: 123  Resp  Min: 18  Max: 19  BP  Min: 92/55  Max: 127/73  SpO2  Min: 90 %  Max: 94 %    Intake/Output Summary (Last 24 hours) at 05/26/2023 2348  Last data filed at 05/26/2023 1800  Gross per 24 hour   Intake 660 ml   Output 650 ml   Net 10 ml        CARE PLAN   Problem: Moderate/High Fall Risk Score >5  Goal: Patient will remain free of falls  Outcome: Progressing  Flowsheets (Taken 05/26/2023 2245)  High (Greater than 13):   HIGH-Consider use of low bed   HIGH-Initiate use of  floor mats as appropriate   HIGH-Pharmacy to initiate evaluation and intervention per protocol   HIGH-Apply yellow "Fall Risk" arm band   HIGH-Utilize chair pad alarm for patient while in the chair   HIGH-Bed alarm on at all times while patient in bed   HIGH-Visual cue at entrance to patient's room     Problem: Safety  Goal: Patient will be free from injury during hospitalization  Outcome: Progressing  Flowsheets (Taken 05/26/2023 2245)  Patient will be free from injury during hospitalization:   Assess patient's risk for falls and implement fall prevention plan of care per policy   Provide and maintain safe environment   Use appropriate transfer methods   Ensure appropriate safety devices are available at the bedside   Include patient/ family/ care giver in decisions related to safety   Hourly rounding  Assess for patients risk for elopement and implement Elopement Risk Plan per policy   Provide alternative method of communication if needed (communication boards, writing)  Goal: Patient will be free from infection during hospitalization  Outcome: Progressing     Problem: Pain  Goal: Pain at adequate level as identified by patient  Outcome: Progressing  Flowsheets (Taken 05/26/2023 2245)  Pain at adequate level as identified by patient:   Identify patient comfort function goal   Assess for risk of opioid induced respiratory depression, including snoring/sleep apnea. Alert healthcare team of risk factors identified.   Assess pain on admission, during daily assessment and/or before any "as needed" intervention(s)   Reassess pain within 30-60 minutes of any procedure/intervention, per Pain Assessment, Intervention, Reassessment (AIR) Cycle   Evaluate if patient comfort function goal is met   Evaluate patient's satisfaction with pain management progress   Offer non-pharmacological pain management interventions   Consult/collaborate with Pain Service   Consult/collaborate with Physical Therapy, Occupational Therapy, and/or  Speech Therapy   Include patient/patient care companion in decisions related to pain management as needed     Problem: Altered GI Function  Goal: Fluid and electrolyte balance are achieved/maintained  Outcome: Progressing  Flowsheets (Taken 05/26/2023 2245)  Fluid and electrolyte balance are achieved/maintained:   Monitor/assess lab values and report abnormal values   Assess and reassess fluid and electrolyte status   Observe for cardiac arrhythmias   Monitor for muscle weakness  Goal: Elimination patterns are normal or improving  Outcome: Progressing     Problem: Non-Violent Restraints Interdisciplinary Plan  Goal: Will be injury free during the use of non-violent restraints  Outcome: Progressing  Flowsheets (Taken 05/26/2023 2245)  Will be injury free during the use of non-violent restraints:   Attempt all alternatives before use of restraints   Initiate least restrictive type of restraint that is effective   Provide and maintain safe environment   Notify family of initiation of restraints   Include patient/family/caregiver in decisions related to safety   Ensure safety devices are properly applied and maintained   Document observed patient actions according to protocol   Nurse to accompany patient off unit when on restraints   Remove restraints before the indicated maximum length of time when meets criteria for discontinuation   Document significant changes in patient condition   Provide debriefing as soon as possible and appropriate   Reassess need for continued restraints   Ensure that order for restraints has not expired     Problem: Compromised Sensory Perception  Goal: Sensory Perception Interventions  Outcome: Progressing  Flowsheets (Taken 05/26/2023 2245)  Sensory Perception Interventions: Offload heels, Pad bony prominences, Reposition q 2hrs/turn Clock, Q2 hour skin assessment under devices if present     Problem: Compromised Moisture  Goal: Moisture level Interventions  Outcome: Progressing  Flowsheets  (Taken 05/26/2023 2245)  Moisture level Interventions: Moisture wicking products, Moisture barrier cream     Problem: Compromised Activity/Mobility  Goal: Activity/Mobility Interventions  Outcome: Progressing  Flowsheets (Taken 05/26/2023 2245)  Activity/Mobility Interventions: Pad bony prominences, TAP Seated positioning system when OOB, Promote PMP, Reposition q 2 hrs / turn clock, Offload heels     Problem: Compromised Nutrition  Goal: Nutrition Interventions  Outcome: Progressing  Flowsheets (Taken 05/26/2023 2245)  Nutrition Interventions: Discuss nutrition at Rounds, I&Os, Document % meal eaten, Daily weights     Problem: Compromised Friction/Shear  Goal: Friction and Shear Interventions  Outcome: Progressing  Flowsheets (Taken 05/26/2023 2245)  Friction and Shear Interventions: Pad  bony prominences, Off load heels, HOB 30 degrees or less unless contraindicated, Consider: TAP seated positioning, Heel foams     Problem: Fluid and Electrolyte Imbalance/ Endocrine  Goal: Fluid and electrolyte balance are achieved/maintained  Outcome: Progressing  Flowsheets (Taken 05/26/2023 2245)  Fluid and electrolyte balance are achieved/maintained:   Monitor/assess lab values and report abnormal values   Assess and reassess fluid and electrolyte status   Observe for cardiac arrhythmias   Monitor for muscle weakness     Problem: Pain interferes with ability to perform ADL  Goal: Pain at adequate level as identified by patient  Outcome: Progressing  Flowsheets (Taken 05/26/2023 2245)  Pain at adequate level as identified by patient:   Identify patient comfort function goal   Assess for risk of opioid induced respiratory depression, including snoring/sleep apnea. Alert healthcare team of risk factors identified.   Assess pain on admission, during daily assessment and/or before any "as needed" intervention(s)   Reassess pain within 30-60 minutes of any procedure/intervention, per Pain Assessment, Intervention, Reassessment (AIR)  Cycle   Evaluate if patient comfort function goal is met   Evaluate patient's satisfaction with pain management progress   Offer non-pharmacological pain management interventions   Consult/collaborate with Pain Service   Consult/collaborate with Physical Therapy, Occupational Therapy, and/or Speech Therapy   Include patient/patient care companion in decisions related to pain management as needed

## 2023-05-26 NOTE — Progress Notes (Signed)
[facility]  PROGRESS NOTE      Patient: Dillon Wood  Date: 05/26/2023   LOS: 5 Days  Admission Date: 05/20/2023   MRN: [id number]  Attending: Gilman Buttner, MD  When on service as attending provider, please contact me on Epic Secure Chat from 7AM-7PM for non-urgent issues. For urgent matters use XTend page from 7AM-7PM.     Chief Complaint:  Chief Complaint   Patient presents with    Rectal Bleeding       History of Present Illness and Interval Summary:  Dillon Wood is a 89 y.o. male with a history of dementia, PVD, chronic anticoagulation due to prior DVT, admitted with GI bleed.  Course complicated due to delirium on dementia.    SUBJECTIVE   The patient was seen at the bedside this morning.  He was more lucid than last night.  Able to carry out conversation.  Denies any active chest pain, shortness of breath, abdominal pain, nausea, vomiting.  Still complains of uncontrolled right leg pain.  No other events reported from overnight.    OBJECTIVE     Vitals:    05/26/23 0707   BP: 110/65   Pulse: 93   Resp: 19   Temp: 97.7 F (36.5 C)   SpO2: 90%       Temperature: Temp  Min: 97.7 F (36.5 C)  Max: 98.6 F (37 C)  Pulse: Pulse  Min: 67  Max: 123  Respiratory: Resp  Min: 18  Max: 19  Non-Invasive BP: BP  Min: 86/53  Max: 152/72  Pulse Oximetry SpO2  Min: 80 %  Max: 99 %    Intake and Output Summary (Last 24 hours) at Date Time    Intake/Output Summary (Last 24 hours) at 05/26/2023 0755  Last data filed at 05/25/2023 1948  Gross per 24 hour   Intake 80 ml   Output 1500 ml   Net -1420 ml       GENERAL: In no acute distress  HEENT: Pupils equal and reactive; moist mucous membranes  CVS: Regular rate and rhythm, normal S1, S2; no murmurs, rubs, gallops  LUNGS: Clear to auscultation bilaterally; No wheezes, rhonchi, crackles  ABDOMEN: Soft; Non-tender, non-distended; positive bowel sounds  EXTREMITIES: No significant edema; no significant right leg findings but hip joint exam is limited  SKIN:  Warm and dry  NEURO: Alert and oriented; No focal weakness noted    MEDICATIONS     Current Facility-Administered Medications   Medication Dose Route Frequency    aspirin  81 mg Oral Daily    atorvastatin  40 mg Oral Daily    gabapentin  100 mg Oral Q8H SCH    lidocaine  1 patch Transdermal Q24H    pantoprazole  40 mg Oral Daily    QUEtiapine  25 mg Oral QHS    senna-docusate  1 tablet Oral Q12H SCH    tamsulosin  0.4 mg Oral Daily after dinner       Allergies: Allergies[1]    LABS     Recent Labs   Lab 05/26/23  0451 05/25/23  0407 05/24/23  0555   WBC 7.02 9.80* 11.35*   RBC 2.88* 2.77* 2.56*   Hemoglobin 9.0* 8.5* 8.0*   Hematocrit 27.3* 26.1* 23.9*   MCV 94.8 94.2 93.4   Platelet Count 124* 101* 85*       Recent Labs   Lab 05/26/23  0451 05/25/23  0407 05/24/23  0555 05/23/23  5409 05/22/23  0305  Sodium 142 139 140 141 139   Potassium 5.8* 4.4 4.2 4.1 3.7   Chloride 111 111 112* 115* 113*   CO2 22 22 23 18 21    BUN 29* 27 20 16 14    Creatinine 1.3 1.3 1.4 1.3 1.1   Glucose 117* 131* 127* 94 129*   Calcium 8.3 7.8* 7.8* 8.2 8.0   Magnesium 2.1 1.9 2.0 1.9 1.4*       Recent Labs   Lab 05/20/23  2313   ALT 14   AST (SGOT) 19   Bilirubin, Total 0.5   Albumin 2.9*   Alkaline Phosphatase 89       Recent Labs   Lab 05/20/23  2313   hs Troponin <2.7       Recent Labs   Lab 05/24/23  0555 05/23/23  0449 05/22/23  0305   INR 1.4 1.5 1.4   PT 15.9* 17.4* 15.3*   PTT 30 26* 27       Microbiology Results (last 15 days)       Procedure Component Value Units Date/Time    Stool Clostridioides difficile Toxin B, PCR [[id number]] Collected: 05/22/23 2120    Order Status: Canceled Specimen: Stool Updated: 05/22/23 2120    Culture, Urine [[id number]] Collected: 05/21/23 0908    Order Status: Completed Specimen: Urine, Clean Catch Updated: 05/22/23 1246     Culture Urine 1,000-9,000 CFU/mL Normal urogenital or skin microbiota     Comment: No further workup.       Culture, Methicillin Resistant Staphylococcus aureus (MRSA)  [[id number]]  (Normal) Collected: 05/21/23 0629    Order Status: Completed Specimen: Swab from Nares Updated: 05/22/23 0722     Culture MRSA Surveillance No Methicillin Resistant Staphylococcus aureus isolated    Culture, Methicillin Resistant Staphylococcus aureus (MRSA) [[id number]]  (Normal) Collected: 05/21/23 0629    Order Status: Completed Specimen: Swab from Throat Updated: 05/22/23 0722     Culture MRSA Surveillance No Methicillin Resistant Staphylococcus aureus isolated    Culture, Blood, Aerobic And Anaerobic [[id number]] Collected: 05/20/23 2340    Order Status: Completed Specimen: Blood, Venous Updated: 05/25/23 0900     Culture Blood No growth at 4 days             RADIOLOGY     XR Chest AP Portable    Result Date: 05/21/2023   Enteric tube extends into the distal stomach/pylorus.  Broly Klepinger 05/21/2023 9:43 PM    Embolization Arterial General    Result Date: 05/21/2023   1. No active bleeding identified. 2. Empiric Gelfoam embolization of the sigmoid artery and its branches. 3. Empiric coil embolization of the Wood rectal artery. Larrie Kass, MD 05/21/2023 11:15 AM    CT Angiogram Abdomen Pelvis    Result Date: 05/21/2023   1.Active hemorrhage within the sigmoid colon likely due to sigmoid diverticulosis. Recommend interventional radiology consultation to guide further management. 2. There is no definite evidence of acute inflammatory process. 3. Incidental note is made of gallstones, left renal calculi, prostate enlargement, and abnormal thickening of the urinary bladder raising concern for hypertrophy. 4. The possibility of bladder neoplasm cannot be excluded. Consider urology consultation to guide further management. 5. Other findings as noted above. 6. Critical findings were discussed with, and read back by, Dr. Rennis Harding at the following time 05/21/2023 1:05 AM. Miguel Dibble, MD 05/21/2023 1:09 AM    XR Chest  AP Portable    Result Date: 05/21/2023  1.Diffuse interstitial prominence may be secondary to  mild pulmonary  edema. 2.Patchy airspace opacities in the lung bases may represent atelectasis or pneumonia versus underlying interstitial lung disease. Judd Gaudier, MD 05/21/2023 12:54 AM    CT Head without Contrast    Result Date: 05/21/2023   No acute intracranial abnormality. Vernie Murders, MD 05/21/2023 12:29 AM     Echo Results       None            Results for orders placed or performed during the hospital encounter of 05/20/23   CT Head without Contrast    Narrative    HISTORY: Head trauma. Pain.    COMPARISON: 10/22/2021.    TECHNIQUE: CT of the head performed without intravenous contrast. The  following dose reduction techniques were utilized: automated exposure  control and/or adjustment of the mA and/or KV according to patient size,  and the use of an iterative reconstruction technique.    CONTRAST: None.    FINDINGS:  Stable volume loss and hypodensities in the white matter consistent with  chronic ischemic changes. No evidence of acute infarct. No mass effect. No  evidence of acute intracranial hemorrhage. No extra-axial collection is  seen.      Impression       No acute intracranial abnormality.    Vernie Murders, MD  05/21/2023 12:29 AM   Results for orders placed or performed in visit on 10/12/21   MRI brain with and without contrast    Narrative    HISTORY: Skin cancer, staging    COMPARISON: None    TECHNIQUE: MRI of the brain performed on a 3.0 Tesla scanner without and  with 15 mL of Clariscan intravenous contrast. Examination performed per  routine brain imaging protocol.     FINDINGS:   There are no abnormal fluid collections. There are no masses. There is  no mass effect or midline shift. Ventricles and sulci are  age-appropriate. Chronic ischemic changes are present.  Diffusion-weighted imaging is unremarkable. There is no evidence of  acute infarction. Scattered foci of hemosiderin deposition are present  secondary to chronic microhemorrhage. The gray-white matter junctions  are  unremarkable. Clival and calvarial marrow signal is normal. Upon the  administration of intravenous contrast, there is no abnormal  enhancement.    There appears to be a 1.1 cm lesion in the right parotid gland deep  lobe.          Impression        1. No acute intracranial process.  2. No mass, hydrocephalus, or pathologic fluid collection.  3. No acute infarct.  4. Chronic ischemic changes and age-appropriate volume are present.   5. There is a 1.1 cm lesion in the right parotid gland deep lobe. Its  imaging characteristics suggest a benign cyst however the lesion is  nonspecific.    Trilby Drummer, MD  10/12/2021 1:08 PM         CHART  REVIEW & DISCUSSION     The following chart items were reviewed as of 7:55 AM on 05/26/23:  [x]  Lab Results [x]  Imaging Results   []  Problem List  [x]  Current Orders [x]  Current Medications  []  Allergies  []  Code Status [x]  Previous Notes   []  SDoH    The management and plan of care for this patient was discussed with the following specialty consultants:  []  Cardiology  []  Gastroenterology                 []  Infectious Disease  []  Pulmonology []  Neurology                []   Nephrology  []  Neurosurgery []  Orthopedic Surgery  []  Heme/Onc  []  General Surgery []  Psychiatry                                   []  Palliative      ASSESSMENT/PLAN     1.  Presenting lower GI bleed leading to acute blood loss anemia, felt to be due to diverticular bleed.  This is in the setting of taking chronic anticoagulation with apixaban.  Patient is status post IR procedure with embolization and coiling.  Continue to monitor for signs of GI bleed.  Monitor serial hemoglobin/hematocrit.  Protonix may be changed to PPI per NG.  This is being followed by GI.     2.  Delirium on top of moderate dementia.  He also has sundowning at home.  Due to excessive lethargy, I reduced atypical antipsychotics to home dose.  His mental status likely will wax and wane but hopefully will be able to improve in the next 2-3 days.      3.  Dysphagia.  This is likely due to above, especially delireum.  NG tube was pulled out yesterday and he is on full liquids.  SLP is following.  Monitoring tolerance to diet.     2.  Hypotension/shock on presentation likely hemorrhagic shock.  Hemodynamics has improved significantly.  Follow H&H and transfuse as needed to keep hemoglobin above 7.     5.  Incidental finding of urinary bladder wall thickening.  He had positive urinalysis but urine culture was negative indicating sterile pyuria.  Consider outpatient urology eval.     6.  Persistent thrombocytopenia.  Suspect this is consumptive in nature.  Platelet count ius improving.     7.  Persistent leukocytosis.  This is reactive in nature and improved.  Infectious workup including urine culture are negative.       8.  Prior history of DVT.  He apparently uses apixaban due to history of DVT in 2022.  He does not need any further anticoagulation.     9.  Stage IIIb chronic kidney disease.  Creatinine has been relatively stable.  This will need to be monitored.    10.  Hyperkalemia.  I suspect this to be a lab error.  Will repeat potassium.  Defer further treatment for now.  He already received calcium gluconate.     11.  Other chronic medical conditions include peripheral vascular disease status post stent placement, hypertension, dyslipidemia.  These are stable.  Continue present management.     12.  Goal of care.  Family hopes the patient will improve.  Will get PT and OT when able to eval ambulatory dysfunction.  He is followed by palliative care service.     For VTE prophylaxis: SCDs.  Lovenox contraindicated in light of presenting bleed.     Encourage mobilization.  Continue PT/OT evaluation.      Patient Active Hospital Problem List:       GI bleed     Date Noted: 05/21/2023                Recent Labs     05/26/23  0451 05/25/23  0407 05/24/23  0555 05/23/23  1600 05/23/23  0816   Platelet Count 124* 101* 85* 87* 76*     Diagnosis: Mild Thrombocytopenia               Recent Labs  05/26/23  0451 05/25/23  0407 05/24/23  0555   Potassium 5.8* 4.4 4.2     Diagnosis: Hyperkalemia        Recent Labs   Lab 05/26/23  0451 05/25/23  0407 05/24/23  0555   Hemoglobin 9.0* 8.5* 8.0*   Hematocrit 27.3* 26.1* 23.9*   MCV 94.8 94.2 93.4   WBC 7.02 9.80* 11.35*   Platelet Count 124* 101* 85*         Anemia Diagnosis: Already documented in note           DVT Prophylaxis:   Current Facility-Administered Medications (Includes Only Anticoagulants, Misc. Hematological)   Medication Dose Route Last Admin   None       Nutrition: Full liquids              Code Status: NO CPR - SUPPORT OK    Dispo: Back to ALF     Family Contact: [name] at [phone]; spoke to her at length yesterday          Signed,  Gilman Buttner, MD    05/26/2023 7:55 AM  Time Elapsed: 45 minutes           [1]   Allergies  Allergen Reactions    Penicillins      Disorientation- per patient, no allergy as of 03/7023

## 2023-05-26 NOTE — Progress Notes (Signed)
Dillon Lowenstein, MD    Dory Peru, NP    [address] [place], [place]    Mon-Sun   [phone]              Palliative Care Progress Note   Date Time: 05/26/23 10:03 AM   Patient Name: Dillon Wood, Dillon Wood   Location: [id number]   Attending Physician: Gilman Buttner, MD   Primary Care Physician: Marisa Sprinkles, MD   Consulting Provider: Dory Peru, NP   Consulting Service: Palliative Medicine        Advance Care Planning   05/26/23    CODE STATUS: NO CPR - SUPPORT OK   Medical Decision Maker: Advance directives: Health Care Agent:  Dillon Wood (Daughter):  [phone]  Dillon Wood (Daughter):  [phone]  Dillon Wood (son):  [phone]        Patient's or Decision Maker's Perspective, Wishes, and Goals for Treatment  -- Patient had a previously signed POLST document from January 2024.  Daughters [name] and Research scientist (physical sciences) at bedside both report the patient does not have the capacity make his own decisions and was not making his own decisions back in January.  They do not know how these documents were signed, patient should not have signed him in the first place.  -- Family does have medical power of attorney documents, they will try to provide a copy to the hospital.  Both daughters [name] as well as son [name] all state the patient should be DNR/DNI and that this accurately reflects patient's wishes.  -- Patient is from [facility] facility.  Plan will be to return where he is then going to be moved to memory care.  -- Patient's wife also has dementia and is currently living with patient at the facility.  -- Patient has been mobile mostly with the use of an electric scooter but has been able to get OOB and walk short distances with assistance.  Has had worsening sundowning but patient is also very hard of hearing and this may be contributing.  -- Family is not ready for hospice services, family states that when patient is doing  well and at baseline he has good quality of life.  Hope is that this hospital stay is for the acute issue only and can then get him home.  -- Palliative care team remains available for continued ACP/GOC conversations    12/1: Extended conversation with patient's daughter [name] and son [name] over the phone.  -- Family with multiple questions regarding differences between hospice and pursuing PT/OT services at facility.  -- Patient does have hospital-acquired delirium in the setting of advanced/end-stage dementia.  Plan is to return to [facility] where he has been a resident.  Hope is that patient's delirium would improve in a familiar environment.  -- Disease progression of dementia was discussed.  Discussed high risk of recurrent hospitalizations due to risk of pneumonia, UTIs, dehydration which would not be unexpected at this stage.  --[name] and [name] believe that patient would not want to return back to the hospital again, believes that comfort measures would likely be the most appropriate direction at this time.  -- They are going to talk with their sibling Dillon Wood and discuss patient's overall status, options and what they believe patient would want.  -- Family confirms that there will be no attempts at replacing NGT.  [name] and [name] are okay with as needed medications for comfort.  --[name] will be present at bedside tomorrow afternoon and specifically request this provider to follow-up  for final discussion/decisions regarding hospice services.  -- Medical attending updated.      Time in 235p Time Out 310p Total Time spent on Advance Care Planning Services with voluntary consent from participants. No active management of the problems listed above was undertaken during the time  period reported.            Assessment  and Plan   Dillon Wood [facility] 89 y.o. 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        AMS  Chronic Back Pain  Progressive Anorexia-Cachexia  Debility  Acute lower GI bleed secondary to sigmoid diverticulosis  --S/p Gelfoam embolization of the IMA and coil placement in superior rectal artery  Hemorrhagic shock  Dementia  Acute blood loss anemia  Protein energy malnutrition  Hospice eligible     Plan   Physical symptoms:     #AMS--fluctuates  -- Adequate oxygenation  -- Pain management  -- Correct anemia  -- Delirium precautions  -- Haldol 1mg  sublingual every 8 hours as needed moderate agitation/restless  -- Haldol 1mg  IV every 4 hours as needed severe agitation/restless  -- Hold Haldol for prolonged Qtc  -- Constipation prophylaxis     #Chronic Back Pain--no pain medication at home--controlled  Family requesting caution with opiates  -- lidocaine patch to back every 24 hours  -- Ultram 50 mg p.o. every 6 hours as needed moderate pain  -- Oxycodone 5 mg p.o. every 4 hours as needed severe pain  -- Constipation prophylaxis     #Progressive Anorexia-Cachexia  -- SLP evaluation  -- Dietary/nutrition consult calorie optimization  -- Calorie dense meals  -- Pleasure feeds  --NGT pulled out by patient--> do not recommend replacing  -- Artificial nutrition not recommended in setting of progressive dementia       #Progressive Debility  -- Pain management  -- Turn and reposition for comfort  -- PT/OT as medically appropriate      Psychosocial :      Patient supported by his wife, 4 children      Based on my assessment at time, estimated Prognosis: Weeks to months. Prognosis can change over time.       PC Team follow-up plans: tomorrow  Discharge Disposition: TBD  Outpatient Follow Up Recommended: Yes                                                                                                                                                                     Chief Complaint   F/u    Interval History   Patient  continues to have periods of agitation, restlessness.  Patient pulled out NGT, replacement is not recommended.  SLP did see the patient was cleared for p.o. intake.  Did take small amounts of p.o., was able to take some oral medications.  As needed Zyprexa x 1, oxycodone x 1, patient received IM Haldol overnight.       Review of Systems   Review of Systems   Unable to perform ROS: Dementia               Medications   Scheduled Meds  Current Facility-Administered Medications   Medication Dose Route Frequency    aspirin  81 mg Oral Daily    atorvastatin  40 mg Oral Daily    gabapentin  100 mg Oral Q8H SCH    lidocaine  1 patch Transdermal Q24H    pantoprazole  40 mg Oral Daily    QUEtiapine  25 mg Oral QHS    senna-docusate  1 tablet Oral Q12H SCH    tamsulosin  0.4 mg Oral Daily after dinner      DRIPS     PRN MEDS  PRN Medications[1]    Allergies   Allergies[2]    Physical Exam   BP 110/65   Pulse 93   Temp 97.7 F (36.5 C) (Oral)   Resp 19   Ht 1.753 m (5\' 9" )   Wt 72.5 kg (159 lb 13.3 oz)   SpO2 90%   BMI 23.60 kg/m    Physical Exam:  GENERAL: Ill-appearing, cachectic  EYES: Sclera anicteric. Conjunctivae pink.  ENT: Oral mucosa moist.  NECK: Trachea midline. Neck veins flat. No adenopathy.  HEART: RRR. Normal S1, S2. No murmur appreciated.  CHEST: Breath sounds are diminished bilaterally.  Room air  ABDOMEN: Soft. Non-tender. Non-distended. BS+.  NGT  GU: Deferred.  RECTAL: Deferred.  MUSCULOSKELETAL: no joint tenderness, deformities or swelling  EXTREMITIES:No edema. No clubbing. No cyanosis.  SKIN: No rash or lesion.  NEURO: Awake, confused, oriented x 1, intermittently restless   Labs / Radiology   Lab and diagnostics: reviewed in Epic  Recent Labs   Lab 05/26/23  0451   WBC 7.02   Hemoglobin 9.0*   Hematocrit 27.3*   Platelet Count 124*       Recent Labs   Lab 05/24/23  0555   PT 15.9*   INR 1.4   PTT 30        Recent Labs   Lab 05/26/23  0451   Sodium 142   Potassium 5.8*   Chloride 111   CO2 22    BUN 29*   Creatinine 1.3   GFR 51.9*   Glucose 117*   Calcium 8.3     Recent Labs   Lab 05/20/23  2313   Bilirubin, Total 0.5   Protein, Total 5.4*   Albumin 2.9*   ALT 14   AST (SGOT) 19          XR Chest AP Portable    Result Date: 05/21/2023   Enteric tube extends into the distal stomach/pylorus.  Luisa Dasari 05/21/2023 9:43 PM    Embolization Arterial General    Result Date: 05/21/2023   1. No active bleeding identified. 2. Empiric Gelfoam embolization of the sigmoid artery and its branches. 3. Empiric coil embolization of the superior rectal artery. Larrie Kass, MD 05/21/2023 11:15 AM    CT Angiogram Abdomen Pelvis    Result Date: 05/21/2023   1.Active hemorrhage within the sigmoid colon likely due to  sigmoid diverticulosis. Recommend interventional radiology consultation to guide further management. 2. There is no definite evidence of acute inflammatory process. 3. Incidental note is made of gallstones, left renal calculi, prostate enlargement, and abnormal thickening of the urinary bladder raising concern for hypertrophy. 4. The possibility of bladder neoplasm cannot be excluded. Consider urology consultation to guide further management. 5. Other findings as noted above. 6. Critical findings were discussed with, and read back by, Dr. Rennis Harding at the following time 05/21/2023 1:05 AM. Miguel Dibble, MD 05/21/2023 1:09 AM    XR Chest  AP Portable    Result Date: 05/21/2023  1.Diffuse interstitial prominence may be secondary to mild pulmonary edema. 2.Patchy airspace opacities in the lung bases may represent atelectasis or pneumonia versus underlying interstitial lung disease. Judd Gaudier, MD 05/21/2023 12:54 AM    CT Head without Contrast    Result Date: 05/21/2023   No acute intracranial abnormality. Vernie Murders, MD 05/21/2023 12:29 AM        Signed by: Dory Peru, NP  [facility]   [phone]             [1]   Current Facility-Administered Medications   Medication Dose    acetaminophen  650 mg     dextrose  15 g of glucose    Or    dextrose  12.5 g    Or    dextrose  12.5 g    Or    glucagon (rDNA)  1 mg    haloperidol lactate  1 mg    magnesium sulfate  1 g    melatonin  3 mg    OLANZapine  2.5 mg    oxyCODONE  2.5 mg    oxyCODONE  5 mg    potassium & sodium phosphates  2 packet    potassium chloride  0-60 mEq    Or    potassium chloride  0-60 mEq    Or    potassium chloride  10 mEq    traMADol  50 mg   [2]   Allergies  Allergen Reactions    Penicillins      Disorientation- per patient, no allergy as of 03/7023

## 2023-05-26 NOTE — Plan of Care (Addendum)
NURSING SHIFT NOTE     Patient: Dillon Wood  Day: 5      SHIFT EVENTS     Shift Narrative/Significant Events (PRN med administration, fall, RRT, etc.):   Patient is A&O*1, confused, restless, agitated, has a sitter,VSS, on room air making adequate O2 saturation, no evidence of pain and sob seen, due meds crushed and served with pudding, tolerated well,  has bil hand mittens for interferrance of care, ext cath insitu, had 2bms, personal hygiene maintained, mobile in bed. Potassium 5.8 this am, attending informed, orders placed and completed. Patient had another episode of large loose bm unfortunately do not fall within the window of cdiff protocol, CN informed to be escalated.  Safety and fall precautions remain in place. Purposeful rounding completed.          ASSESSMENT     Changes in assessment from patient's baseline this shift:    Neuro: No  CV: No  Pulm: No  Peripheral Vascular: No  HEENT: No  GI: No  BM during shift: No, Last BM: Last BM Date: 05/25/23  GU: No   Integ: No  MS: No  Pain: Improved  Pain Interventions: Medications and Rest  Medications Utilized:   Mobility: PMP Activity: Step 3 - Bed Mobility of Distance Walked (ft) (Step 6,7): 0 Feet           Lines     Patient Lines/Drains/Airways Status       Active Lines, Drains and Airways       Name Placement date Placement time Site Days    Peripheral IV 05/20/23 18 G Left Antecubital 05/20/23  2323  Antecubital  5    External Urinary Catheter 05/25/23  2011  --  less than 1                         VITAL SIGNS     Vitals:    05/26/23 0132   BP: 102/63   Pulse: 96   Resp: 18   Temp: 98.4 F (36.9 C)   SpO2:      Temp  Min: 97.5 F (36.4 C)  Max: 98.6 F (37 C)  Pulse  Min: 67  Max: 123  Resp  Min: 18  Max: 19  BP  Min: 86/53  Max: 152/72  SpO2  Min: 80 %  Max: 99 %    Intake/Output Summary (Last 24 hours) at 05/26/2023 5409  Last data filed at 05/25/2023 1948  Gross per 24 hour   Intake 80 ml   Output 1500 ml   Net -1420 ml        CARE PLAN    Problem: Safety  Goal: Patient will be free from injury during hospitalization  Outcome: Progressing  Flowsheets (Taken 05/25/2023 2359)  Patient will be free from injury during hospitalization:   Assess patient's risk for falls and implement fall prevention plan of care per policy   Provide and maintain safe environment   Use appropriate transfer methods   Ensure appropriate safety devices are available at the bedside   Include patient/ family/ care giver in decisions related to safety   Hourly rounding   Assess for patients risk for elopement and implement Elopement Risk Plan per policy   Provide alternative method of communication if needed (communication boards, writing)  Goal: Patient will be free from infection during hospitalization  Outcome: Progressing  Flowsheets (Taken 05/25/2023 2359)  Free from Infection during hospitalization:   Assess and  monitor for signs and symptoms of infection   Monitor lab/diagnostic results   Monitor all insertion sites (i.e. indwelling lines, tubes, urinary catheters, and drains)   Encourage patient and family to use good hand hygiene technique     Problem: Pain  Goal: Pain at adequate level as identified by patient  Outcome: Progressing  Flowsheets (Taken 05/25/2023 2359)  Pain at adequate level as identified by patient:   Identify patient comfort function goal   Assess for risk of opioid induced respiratory depression, including snoring/sleep apnea. Alert healthcare team of risk factors identified.   Assess pain on admission, during daily assessment and/or before any "as needed" intervention(s)   Reassess pain within 30-60 minutes of any procedure/intervention, per Pain Assessment, Intervention, Reassessment (AIR) Cycle   Evaluate if patient comfort function goal is met   Evaluate patient's satisfaction with pain management progress   Offer non-pharmacological pain management interventions   Consult/collaborate with Pain Service   Consult/collaborate with Physical Therapy,  Occupational Therapy, and/or Speech Therapy   Include patient/patient care companion in decisions related to pain management as needed     Problem: Altered GI Function  Goal: Fluid and electrolyte balance are achieved/maintained  Outcome: Progressing  Flowsheets (Taken 05/25/2023 2359)  Fluid and electrolyte balance are achieved/maintained:   Monitor/assess lab values and report abnormal values   Assess and reassess fluid and electrolyte status   Observe for cardiac arrhythmias   Monitor for muscle weakness  Goal: Elimination patterns are normal or improving  Outcome: Progressing     Problem: Non-Violent Restraints Interdisciplinary Plan  Goal: Will be injury free during the use of non-violent restraints  Outcome: Progressing  Flowsheets (Taken 05/25/2023 2359)  Will be injury free during the use of non-violent restraints:   Attempt all alternatives before use of restraints   Initiate least restrictive type of restraint that is effective   Provide and maintain safe environment   Notify family of initiation of restraints   Include patient/family/caregiver in decisions related to safety   Ensure safety devices are properly applied and maintained   Document observed patient actions according to protocol   Nurse to accompany patient off unit when on restraints   Remove restraints before the indicated maximum length of time when meets criteria for discontinuation   Document significant changes in patient condition   Provide debriefing as soon as possible and appropriate   Reassess need for continued restraints   Ensure that order for restraints has not expired     Problem: Violent or Self-destructive Restraints Interdisciplinary Plan  Goal: Will be injury free during the use of restraints  Outcome: Progressing  Flowsheets (Taken 05/25/2023 2359)  Will be injury free during the use of restraints:   Attempt all alternatives before use of restraints   Initiate least restrictive type of restraint that is effective   Provide  and maintain safe environment   Notify family of initiation of restraints   Include patient/family/caregiver in decisions related to safety   Ensure safety devices are properly applied and maintained   Document observed patient actions according to protocol   Nurse to accompany patient off unit when on restraints   Remove restraints before the indicated maximum length of time when meets criteria for discontinuation   Document significant changes in patient condition   Provide debriefing as soon as possible and appropriate   Reassess need for continued restraints   Ensure that order for restraints has not expired     Problem: Compromised Sensory Perception  Goal: Sensory Perception Interventions  Outcome: Progressing  Flowsheets (Taken 05/25/2023 2359)  Sensory Perception Interventions: Offload heels, Pad bony prominences, Reposition q 2hrs/turn Clock, Q2 hour skin assessment under devices if present     Problem: Compromised Moisture  Goal: Moisture level Interventions  Outcome: Progressing  Flowsheets (Taken 05/25/2023 2359)  Moisture level Interventions: Moisture wicking products, Moisture barrier cream     Problem: Compromised Activity/Mobility  Goal: Activity/Mobility Interventions  Outcome: Progressing  Flowsheets (Taken 05/25/2023 2359)  Activity/Mobility Interventions: Pad bony prominences, TAP Seated positioning system when OOB, Promote PMP, Reposition q 2 hrs / turn clock, Offload heels     Problem: Fluid and Electrolyte Imbalance/ Endocrine  Goal: Fluid and electrolyte balance are achieved/maintained  Outcome: Progressing  Flowsheets (Taken 05/25/2023 2359)  Fluid and electrolyte balance are achieved/maintained:   Monitor/assess lab values and report abnormal values   Assess and reassess fluid and electrolyte status   Observe for cardiac arrhythmias   Monitor for muscle weakness     Problem: Pain interferes with ability to perform ADL  Goal: Pain at adequate level as identified by patient  Outcome:  Progressing  Flowsheets (Taken 05/25/2023 2359)  Pain at adequate level as identified by patient:   Identify patient comfort function goal   Assess for risk of opioid induced respiratory depression, including snoring/sleep apnea. Alert healthcare team of risk factors identified.   Assess pain on admission, during daily assessment and/or before any "as needed" intervention(s)   Reassess pain within 30-60 minutes of any procedure/intervention, per Pain Assessment, Intervention, Reassessment (AIR) Cycle   Evaluate if patient comfort function goal is met   Evaluate patient's satisfaction with pain management progress   Offer non-pharmacological pain management interventions   Consult/collaborate with Pain Service   Consult/collaborate with Physical Therapy, Occupational Therapy, and/or Speech Therapy   Include patient/patient care companion in decisions related to pain management as needed

## 2023-05-27 LAB — CBC
Absolute nRBC: 0 10*3/uL (ref <=0.00)
Hematocrit: 22.5 % — ABNORMAL LOW (ref 37.6–49.6)
Hemoglobin: 7.3 g/dL — ABNORMAL LOW (ref 12.5–17.1)
MCH: 30.7 pg (ref 25.1–33.5)
MCHC: 32.4 g/dL (ref 31.5–35.8)
MCV: 94.5 fL (ref 78.0–96.0)
MPV: 9.9 fL (ref 8.9–12.5)
Platelet Count: 123 10*3/uL — ABNORMAL LOW (ref 142–346)
RBC: 2.38 10*6/uL — ABNORMAL LOW (ref 4.20–5.90)
RDW: 15 % (ref 11–15)
WBC: 6.03 10*3/uL (ref 3.10–9.50)
nRBC %: 0 /100{WBCs} (ref <=0.0)

## 2023-05-27 LAB — HEMOGLOBIN AND HEMATOCRIT
Hematocrit: 25.1 % — ABNORMAL LOW (ref 37.6–49.6)
Hemoglobin: 8 g/dL — ABNORMAL LOW (ref 12.5–17.1)

## 2023-05-27 MED ORDER — TRAMADOL HCL 50 MG PO TABS
50.0000 mg | ORAL_TABLET | Freq: Four times a day (QID) | ORAL | Status: DC | PRN
Start: 2023-05-27 — End: 2023-05-28
  Administered 2023-05-27 – 2023-05-28 (×3): 50 mg via ORAL
  Filled 2023-05-27 (×3): qty 1

## 2023-05-27 MED ORDER — GABAPENTIN 100 MG PO CAPS
100.0000 mg | ORAL_CAPSULE | Freq: Three times a day (TID) | ORAL | 0 refills | Status: AC
Start: 2023-05-27 — End: 2023-06-26

## 2023-05-27 MED ORDER — LIDOCAINE 5 % EX PTCH
1.0000 | MEDICATED_PATCH | CUTANEOUS | 0 refills | Status: AC
Start: 2023-05-27 — End: 2023-06-26

## 2023-05-27 MED ORDER — NALOXONE HCL 4 MG/0.1ML NA LIQD
NASAL | 0 refills | Status: AC
Start: 2023-05-27 — End: ?

## 2023-05-27 MED ORDER — POLYETHYLENE GLYCOL 3350 17 G PO PACK
17.0000 g | PACK | Freq: Two times a day (BID) | ORAL | 0 refills | Status: AC | PRN
Start: 2023-05-27 — End: 2023-06-26

## 2023-05-27 MED ORDER — SENNOSIDES-DOCUSATE SODIUM 8.6-50 MG PO TABS
2.0000 | ORAL_TABLET | Freq: Two times a day (BID) | ORAL | 0 refills | Status: AC | PRN
Start: 2023-05-27 — End: 2023-06-26

## 2023-05-27 MED ORDER — LORAZEPAM 1 MG/0.5ML PO CONC
0.5000 mg | ORAL | 0 refills | Status: AC | PRN
Start: 2023-05-27 — End: 2023-05-30

## 2023-05-27 MED ORDER — MORPHINE SULFATE 10 MG/5ML PO SOLN
2.5000 mg | ORAL | 0 refills | Status: AC | PRN
Start: 2023-05-27 — End: 2023-06-01

## 2023-05-27 MED ORDER — NALOXONE HCL 0.4 MG/ML IJ SOLN (WRAP)
0.4000 mg | INTRAMUSCULAR | Status: DC | PRN
Start: 2023-05-27 — End: 2023-05-28

## 2023-05-27 MED ORDER — ONDANSETRON 4 MG PO TBDP
4.0000 mg | ORAL_TABLET | Freq: Four times a day (QID) | ORAL | 0 refills | Status: AC | PRN
Start: 2023-05-27 — End: 2023-06-26

## 2023-05-27 NOTE — PT Progress Note (Signed)
Physical Therapy Treatment  [name]  Post Acute Care Therapy Recommendations   Discharge Recommendations:  Home with supervision, Home with home health PT, Home with home health OT (D/C to memory care unit at [facility] with HHPT and HHOT)      DME needs IF patient is discharging home: No additional equipment/DME recommended at this time    Therapy discharge recommendations may change with patient status.  Please refer to most recent note for up-to-date recommendations.    Unit: [place] UN[address]  Bed: A2106/A2106-A    ___________________________________________________    Time of treatment:  PT Received On: 05/27/23  Start Time: 1610  Stop Time: 1640  Time Calculation (min): 30 min       Chart Review and Collaboration with Care Team: 5 minutes, not included in above time.    PT Visit Number: 2    ___________________________________________________    Precautions:   Precautions  Weight Bearing Status: no restrictions  Aspiration Precautions: HOB at 30 degrees  Fall Risks: High, Mental status change, Muscle weakness    Personal Protective Equipment (PPE)  gloves and procedure mask    Updated X-Rays/Tests/Labs:  Lab Results   Component Value Date/Time    HGB 8.0 (L) 05/27/2023 05:33 AM    HGB 12.4 (L) 08/27/2022 10:40 AM    HCT 25.1 (L) 05/27/2023 05:33 AM    HCT 37.2 (L) 08/27/2022 10:40 AM    K 4.5 05/26/2023 11:13 AM    K 4.3 08/27/2022 10:40 AM    NA 142 05/26/2023 04:51 AM    NA 140 08/27/2022 10:40 AM    INR 1.4 05/24/2023 05:55 AM    INR 1.4 (H) 08/27/2022 10:40 AM    TROPI <2.7 05/20/2023 11:13 PM       All imaging reviewed, please see chart for details.      Subjective:  "I will try."                     Patient's medical condition is appropriate for Physical Therapy intervention at this time.  Patient is agreeable to participation in the therapy session. Nursing clears patient for therapy.      Objective:  Observation of Patient/Vital Signs:  Vitals:    05/27/23 1600   BP: 105/61   Pulse:  92   Resp: 20   Temp: 98.7 F (37.1 C)   SpO2: 92%   ;ls      Cognition/Neuro Status  Arousal/Alertness: Delayed responses to stimuli  Attention Span: Difficulty dividing attention  Orientation Level: Disoriented to place;Disoriented to situation;Disoriented to time  Memory: Unable to assess  Following Commands: 25%  Safety Awareness: maximal verbal instruction  Insights: Educated in safety awareness  Problem Solving: maximum assistance  Behavior: anxious  Motor Planning: decreased processing speed;decreased initiation  Coordination: GMC impaired;FMC impaired    Musculoskeletal Examination  Gross ROM  Right Lower Extremity ROM: within functional limits  Left Lower Extremity ROM: within functional limits                     Functional Mobility  Rolling: Maximal Assist  Supine to Sit: Dependent  Scooting to HOB: Dependent  Scooting to EOB: Dependent  Sit to Supine: Dependent        Distance Walked (ft) (Step 6,7): 0 Feet           AAROM there ex supine for heel slides, hip abd/add and ankle pumps x 10 and requires guidance for technique.  Educated the Patient to role of physical therapy, plan of care, goals of therapy and safety with mobility and ADLs, discharge instructions with no indication of understanding.  Additional education required..    Patient left in bed with all medical equipment in place and call bell and all personal items/needs within reach (of note, pt received without alarm in place).  RN notified of session outcome.        Assessment:  Pt received supine in bed and 1:1 companion at bedside. Pt with noted confusion and requires increased time to perform tasks. Pt currently requires dependent with bed mobility and poor sitting balance. Pt performed modified standing with Max A x 2 with flexed posture. PT continue to recommend return memory care. Pt would continue to benefit from skilled PT services to address deficits.                 PMP Activity: Step 4 - Dangle at Bedside  Distance  Walked (ft) (Step 6,7): 0 Feet    Plan:  Treatment/Interventions: Exercise, Gait training, Neuromuscular re-education, Functional transfer training, LE strengthening/ROM, Endurance training, Cognitive reorientation, Patient/family training, Equipment eval/education, Bed mobility      PT Frequency: 1-2x/wk   Continue plan of care.    Goals:  Goals  Goal Formulation: Patient unable to participate in goal setting  Time for Goal Acheivement: By time of discharge  Pt Will Go Supine To Sit: with supervision, to maximize functional mobility and independence  Pt Will Perform Sit to Stand: with supervision, to maximize functional mobility and independence  Pt Will Transfer Bed/Chair: with rolling walker, modified independent, to maximize functional mobility and independence  Pt Will Ambulate: 11-30 feet, with rolling walker, with contact guard assist, to maximize functional mobility and independence      Donia Pounds, [place]   [phone] [phone]  05/27/2023 6:14 PM      [facility]  Patient: Dillon Wood [facility] MRN#: [id number]  Unit: [place] UN[address] Bed: A2106/A2106-A

## 2023-05-27 NOTE — Discharge Summary (Signed)
[facility]      Patient: Dillon Wood  Admission Date: 05/20/2023   DOB: [birth year]  Discharge Date: 05/27/2023    MRN: [id number]  Discharge Attending:Aerianna Losey Audley Hose, MD   Referring Physician: Pcp, None, MD  PCP: Pcp, None, MD       DISCHARGE SUMMARY     Discharge Information   Admission Diagnosis:   GI bleed    Discharge Diagnosis:   1.  Presenting lower GI bleed leading to acute blood loss anemia, felt to be due to diverticular bleed  2.  Hemorrhagic shock  3.  Delirium on top of dementia  4.  Dysphagia due to above, improved  5.  Incidental finding of urinary bladder wall thickening  6.  Acute thrombocytopenia, consumptive  7.  Reactive leukocytosis  8.  Stage IIIb chronic kidney disease  9.  Peripheral vascular disease  10.  Prior history of DVT    Discharge Condition: Stable  Consultants: Interventional radiology, gastroenterology  Discharged to: Assisted living with hospice       Hospital Course   Presentation History   Quote from Medical H&P:    "Dillon Wood is a 89 y.o. 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  No history of GI bleeds in the past.  In ED patient was afebrile, heart rate 90, blood pressure 75/48, satting 96% on room air with no respiratory distress, labs notable for hemoglobin 9.1, platelets 116, creatinine 1.6, lactate 2.3, INR 2.0, PTT 22.  Electrolytes, LFTs, and troponin within normal limits.  While in the ED patient had large-volume BRBPR with significant clot burden, repeat CBC with hemoglobin 7.7.  CXR with patchy airspace opacities, CT head negative for acute intracranial findings.  CTA abdomen pelvis with active hemorrhage in the sigmoid colon likely due to diverticulosis.  In ED patient received 2 L IVF, Kcentra for apixaban reversal, IV TXA, and 2  units PRBC with improvement in blood pressure, lactate normalized. Patient went emergently to IR with Dr. Excell Seltzer, who noted no active extravasation noted. Empiric Gelfoam embolization of sigmoid branches of the IMA performed. Single coil placed in the superior rectal artery as well. Hypotensive in IR suite. MTP activated. Admitted to ICU postprocedure.      Patient lives in [facility] community with his wife."    See HPI for details.    Hospital Course (6 Days)   Dillon Wood is a 89 y.o. male with a history of peripheral vascular disease status post stent placement, dementia, prior history of DVT on anticoagulation, prior history of skin cancer s/p resection, who presented with lower GI bleed.  Apparently he is incontinent on baseline, requiring diapers.  He was noted to have dried blood in underwear for few days.  Patient had episode of bright red blood per rectum in the bathroom on the day of presentation, and then had a ground-level fall.  He was noted to have large volume BRBPR with large clot burden, hemoglobin 7.7.  CTA showed active hemorrhage in the sigmoid colon likely diverticular bleed.  Patient was given IV fluids, blood transfusion, Kcentra for apixaban reversal.  He went to IR where no extravasation is noted.  He had empirical embolization of sigmoid branches of the IMA with single coil placed in superior rectal artery.  He was admitted to the  ICU where he did require Levophed for a time which improved.  He last had DVT episode in 2022, and may be safely taken off of apixaban.  His aspirin was continued and hemoglobin is stable with no signs of re-bleed.    Hospital course also significant for delirium/metabolic encephalopathy on baseline of dementia, at least moderate in degree.  Overall, mental status is are slowly improving but patient still having frequent sundowning, agitation, requiring sedation.  This was discussed with family, who decided to change goal of care to comfort oriented  approach.  He will go back to his assisted living facility on hospice.       Best Practices   Was the patient admitted with either a CHF Exacerbation or Pneumonia? NO     Progress Note/Physical Exam at Discharge     Subjective: The patient was seen at the bedside this morning.  He was lethargic.  Patient was placed back on mittens yesterday afternoon due to continued agitation but apparently did well overnight.  He was arousable.  Still has right leg pain but otherwise denies chest pain, abdominal pain, shortness of breath, nausea.  No other events reported from overnight.    Vitals:    05/27/23 0400 05/27/23 0600 05/27/23 0721 05/27/23 1139   BP:   93/54 96/54   Pulse:   85 87   Resp: 18 19 19 19    Temp:   99.3 F (37.4 C) 97.5 F (36.4 C)   TempSrc:   Oral Oral   SpO2:   92% 94%   Weight:       Height:           GENERAL: In no acute distress  HEENT: Pupils equal and reactive; moist mucous membranes  CVS: Regular rate and rhythm, normal S1, S2; no murmurs, rubs, gallops  LUNGS: Clear to auscultation bilaterally; No wheezes, rhonchi, crackles  ABDOMEN: Soft; Non-tender, non-distended; positive bowel sounds  EXTREMITIES: No significant edema; no significant right leg findings  SKIN: Warm and dry  NEURO: Alert and oriented; No focal weakness noted       Diagnostics     Labs/Studies Pending at Discharge: No    Last Labs   Recent Labs   Lab 05/27/23  0533 05/27/23  0356 05/26/23  0451 05/25/23  0407   WBC  --  6.03 7.02 9.80*   RBC  --  2.38* 2.88* 2.77*   Hemoglobin 8.0* 7.3* 9.0* 8.5*   Hematocrit 25.1* 22.5* 27.3* 26.1*   MCV  --  94.5 94.8 94.2   Platelet Count  --  123* 124* 101*       Recent Labs   Lab 05/26/23  1113 05/26/23  0451 05/25/23  0407 05/24/23  0555 05/23/23  0635 05/22/23  0305   Sodium  --  142 139 140 141 139   Potassium 4.5 5.8* 4.4 4.2 4.1 3.7   Chloride  --  111 111 112* 115* 113*   CO2  --  22 22 23 18 21    BUN  --  29* 27 20 16 14    Creatinine  --  1.3 1.3 1.4 1.3 1.1   Glucose  --  117* 131*  127* 94 129*   Calcium  --  8.3 7.8* 7.8* 8.2 8.0   Magnesium  --  2.1 1.9 2.0 1.9 1.4*       Microbiology Results (last 15 days)       Procedure Component Value Units Date/Time    Stool Clostridioides difficile  Toxin B, PCR [[id number]] Collected: 05/22/23 2120    Order Status: Canceled Specimen: Stool Updated: 05/22/23 2120    Culture, Urine [[id number]] Collected: 05/21/23 0908    Order Status: Completed Specimen: Urine, Clean Catch Updated: 05/22/23 1246     Culture Urine 1,000-9,000 CFU/mL Normal urogenital or skin microbiota     Comment: No further workup.       Culture, Methicillin Resistant Staphylococcus aureus (MRSA) [[id number]]  (Normal) Collected: 05/21/23 0629    Order Status: Completed Specimen: Swab from Nares Updated: 05/22/23 0722     Culture MRSA Surveillance No Methicillin Resistant Staphylococcus aureus isolated    Culture, Methicillin Resistant Staphylococcus aureus (MRSA) [[id number]]  (Normal) Collected: 05/21/23 0629    Order Status: Completed Specimen: Swab from Throat Updated: 05/22/23 0722     Culture MRSA Surveillance No Methicillin Resistant Staphylococcus aureus isolated    Culture, Blood, Aerobic And Anaerobic [[id number]] Collected: 05/20/23 2340    Order Status: Completed Specimen: Blood, Venous Updated: 05/26/23 0900     Culture Blood No growth at 5 days            Procedures/Imaging:   Upon my review: XR Chest AP Portable    Result Date: 05/21/2023   Enteric tube extends into the distal stomach/pylorus.  Dillon Wood 05/21/2023 9:43 PM    Embolization Arterial General    Result Date: 05/21/2023   1. No active bleeding identified. 2. Empiric Gelfoam embolization of the sigmoid artery and its branches. 3. Empiric coil embolization of the superior rectal artery. Dillon Kass, MD 05/21/2023 11:15 AM    CT Angiogram Abdomen Pelvis    Result Date: 05/21/2023   1.Active hemorrhage within the sigmoid colon likely due to sigmoid diverticulosis. Recommend interventional radiology consultation to  guide further management. 2. There is no definite evidence of acute inflammatory process. 3. Incidental note is made of gallstones, left renal calculi, prostate enlargement, and abnormal thickening of the urinary bladder raising concern for hypertrophy. 4. The possibility of bladder neoplasm cannot be excluded. Consider urology consultation to guide further management. 5. Other findings as noted above. 6. Critical findings were discussed with, and read back by, Dr. Rennis Harding at the following time 05/21/2023 1:05 AM. Miguel Dibble, MD 05/21/2023 1:09 AM    XR Chest  AP Portable    Result Date: 05/21/2023  1.Diffuse interstitial prominence may be secondary to mild pulmonary edema. 2.Patchy airspace opacities in the lung bases may represent atelectasis or pneumonia versus underlying interstitial lung disease. Judd Gaudier, MD 05/21/2023 12:54 AM    CT Head without Contrast    Result Date: 05/21/2023   No acute intracranial abnormality. Vernie Murders, MD 05/21/2023 12:29 AM         Patient Instructions   Discharge Diet: Regular  Discharge Activity: Home    Follow Up Appointment:   Follow-up Information       PCP Follow up.               hospice Follow up.                             Discharge Medications:     Medication List        START taking these medications      gabapentin 100 MG capsule  Commonly known as: NEURONTIN  Take 1 capsule (100 mg) by mouth 3 (three) times daily     lidocaine 5 %  Commonly known as: LIDODERM  Place 1  patch onto the skin every 24 hours Remove & Discard patch within 12 hours or as directed by MD     LORazepam 1 MG/0.5ML Conc  Take 0.5 mg by mouth every 4 (four) hours as needed (for anxiety)     morphine 10 MG/5ML solution  Take 1.3 mLs (2.6 mg) by mouth every 2 (two) hours as needed for Pain     * naloxone 4 MG/0.1ML nasal spray  Commonly known as: NARCAN  1 spray intranasally. If pt does not respond or relapses into respiratory depression call 911. Give additional doses every 2-3 min.     *  naloxone 4 MG/0.1ML nasal spray  Commonly known as: NARCAN  1 spray intranasally. If pt does not respond or relapses into respiratory depression call 911. Give additional doses every 2-3 min.     ondansetron 4 MG disintegrating tablet  Commonly known as: ZOFRAN-ODT  Take 1 tablet (4 mg) by mouth every 6 (six) hours as needed for Nausea     polyethylene glycol 17 g packet  Commonly known as: MIRALAX  Take 17 g by mouth 2 (two) times daily as needed (for constipation)     senna-docusate 8.6-50 MG per tablet  Commonly known as: PERICOLACE  Take 2 tablets by mouth 2 (two) times daily as needed for Constipation           * This list has 2 medication(s) that are the same as other medications prescribed for you. Read the directions carefully, and ask your doctor or other care provider to review them with you.                CONTINUE taking these medications      aspirin EC 81 MG EC tablet  Take 1 tablet (81 mg total) by mouth daily     atorvastatin 40 MG tablet  Commonly known as: LIPITOR  Take 1 tablet (40 mg total) by mouth daily     desonide 0.05 % ointment  Commonly known as: DESOWEN  Apply topically 2 (two) times daily Apply bid to scalp/face until reaction improved     mupirocin 2 % ointment  Commonly known as: BACTROBAN  Apply bid to face/scalp lesions until healed     niacinamide 500 MG tablet     QUEtiapine 25 MG tablet  Commonly known as: SEROquel  Take 1 tablet (25 mg) by mouth nightly     tamsulosin 0.4 MG Caps  Commonly known as: FLOMAX     triamcinolone 0.1 % ointment  Commonly known as: KENALOG  Apply bid to eczema on lower body until clear, then use PRN            STOP taking these medications      apixaban 5 MG  Commonly known as: ELIQUIS               Where to Get Your Medications        These medications were sent to [facility] [place], [place] - [address]  [address], [place] [address]      Phone: [phone]   gabapentin 100 MG capsule  lidocaine 5 %  LORazepam 1  MG/0.5ML Conc  morphine 10 MG/5ML solution  naloxone 4 MG/0.1ML nasal spray  naloxone 4 MG/0.1ML nasal spray  ondansetron 4 MG disintegrating tablet  polyethylene glycol 17 g packet  senna-docusate 8.6-50 MG per tablet             Time spent examining patient, discussing  with patient/family regarding hospital course, chart review, reconciling medications and discharge planning: 40 minutes.    SignedGilman Buttner, MD    12:50 PM 05/27/2023

## 2023-05-27 NOTE — Progress Notes (Signed)
Rosina Lowenstein, MD    Dory Peru, NP    [address] [place], [place]    Mon-Sun   [phone]              Palliative Care Progress Note   Date Time: 05/27/23 9:20 AM   Patient Name: Dillon Wood, Dillon Wood   Location: U9811/B1478-G   Attending Physician: Gilman Buttner, MD   Primary Care Physician: Marisa Sprinkles, MD   Consulting Provider: Dory Peru, NP   Consulting Service: Palliative Medicine        Advance Care Planning   05/27/23    CODE STATUS: NO CPR - SUPPORT OK   Medical Decision Maker: Advance directives: Health Care Agent:  [name] (Daughter):  [phone]  [name] (Daughter):  [phone]  Doan Porro (son):  [phone]        Patient's or Decision Maker's Perspective, Wishes, and Goals for Treatment  -- Patient had a previously signed POLST document from January 2024.  Daughters [name] and Research scientist (physical sciences) at bedside both report the patient does not have the capacity make his own decisions and was not making his own decisions back in January.  They do not know how these documents were signed, patient should not have signed him in the first place.  -- Family does have medical power of attorney documents, they will try to provide a copy to the hospital.  Both daughters [name] as well as son [name] all state the patient should be DNR/DNI and that this accurately reflects patient's wishes.  -- Patient is from [facility] facility.  Plan will be to return where he is then going to be moved to memory care.  -- Patient's wife also has dementia and is currently living with patient at the facility.  -- Patient has been mobile mostly with the use of an electric scooter but has been able to get OOB and walk short distances with assistance.  Has had worsening sundowning but patient is also very hard of hearing and this may be contributing.  -- Family is not ready for hospice services, family states that when patient is doing  well and at baseline he has good quality of life.  Hope is that this hospital stay is for the acute issue only and can then get him home.  -- Palliative care team remains available for continued ACP/GOC conversations    12/1: Extended conversation with patient's daughter [name] and son [name] over the phone.  -- Family with multiple questions regarding differences between hospice and pursuing PT/OT services at facility.  -- Patient does have hospital-acquired delirium in the setting of advanced/end-stage dementia.  Plan is to return to [facility] where he has been a resident.  Hope is that patient's delirium would improve in a familiar environment.  -- Disease progression of dementia was discussed.  Discussed high risk of recurrent hospitalizations due to risk of pneumonia, UTIs, dehydration which would not be unexpected at this stage.  --[name] and [name] believe that patient would not want to return back to the hospital again, believes that comfort measures would likely be the most appropriate direction at this time.  -- They are going to talk with their sibling [name] and discuss patient's overall status, options and what they believe patient would want.  -- Family confirms that there will be no attempts at replacing NGT.  [name] and [name] are okay with as needed medications for comfort.  --[name] will be present at bedside tomorrow afternoon and specifically request this provider to follow-up  for final discussion/decisions regarding hospice services.  -- Medical attending updated.    12/2Orlene Erm with patient daughter [name] over the phone.  -- Daughter states that family has had conversations and has ultimately decided to pursue hospice services at [facility].  -- Family is already informed to the facility, made initial contact with the hospice provider:  [facility]  -- Family wishes to continue basic medical treatment while awaiting discharge back to facility with hospice services.  -- CODE STATUS  changed to DNR-AND per family request.  -- Medical team as well as case management made aware.  Hospice order in place      Time in Time Out Total Time mins spent on Advance Care Planning Services with voluntary consent from participants. No active management of the problems listed above was undertaken during the time  period reported.            Assessment  and Plan   [facility] 89 y.o. 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        AMS  Chronic Back Pain  Progressive Anorexia-Cachexia  Debility  Acute lower GI bleed secondary to sigmoid diverticulosis  --S/p Gelfoam embolization of the IMA and coil placement in superior rectal artery  Hemorrhagic shock  Dementia  Acute blood loss anemia  Protein energy malnutrition  Hospice eligible--> family choosing discharge with hospice services.     Plan   Physical symptoms:     #AMS--fluctuates  -- Adequate oxygenation  -- Pain management  -- Correct anemia  -- Delirium precautions  -- Haldol 1mg  sublingual every 8 hours as needed moderate agitation/restless  -- Haldol 1mg  IV every 4 hours as needed severe agitation/restless  -- Hold Haldol for prolonged Qtc  -- Constipation prophylaxis     #Chronic Back Pain--no pain medication at home--controlled  -- lidocaine patch to back every 24 hours  -- Ultram 50 mg p.o. every 6 hours as needed moderate pain  -- Oxycodone 5 mg p.o. every 4 hours as needed severe pain  -- Constipation prophylaxis     #Progressive Anorexia-Cachexia  -- SLP evaluation  -- Dietary/nutrition consult calorie optimization  -- Calorie dense meals  -- Pleasure feeds  --NGT pulled out by patient--> do not recommend replacing  -- Artificial nutrition not recommended in setting of progressive dementia       #Progressive Debility  -- Pain management  -- Turn and reposition  for comfort  -- PT/OT as medically appropriate      Psychosocial :      Patient supported by his wife, 4 children      Based on my assessment at time, estimated Prognosis: Weeks to months. Prognosis can change over time.       PC Team follow-up plans: tomorrow  Discharge Disposition: Hospice - home  Outpatient Follow Up Recommended: Yes  Chief Complaint   F/u    Interval History   Patient resting comfortably at time of visit, safety sitter remains at bedside but mitten restraints were removed.  Patient is tolerating liquid p.o., ate approximately half of breakfast.  Patient did receive Zyprexa x 2 and Ultram x 1 last night with good effect.     Review of Systems   Review of Systems   Unable to perform ROS: Dementia               Medications   Scheduled Meds  Current Facility-Administered Medications   Medication Dose Route Frequency    aspirin  81 mg Oral Daily    atorvastatin  40 mg Oral Daily    gabapentin  100 mg Oral Q8H SCH    lidocaine  1 patch Transdermal Q24H    pantoprazole  40 mg Oral Daily    QUEtiapine  25 mg Oral QHS    senna-docusate  1 tablet Oral Q12H SCH    tamsulosin  0.4 mg Oral Daily after dinner      DRIPS     PRN MEDS  PRN Medications[1]    Allergies   Allergies[2]    Physical Exam   BP 93/54   Pulse 85   Temp 99.3 F (37.4 C) (Oral)   Resp 19   Ht 1.753 m (5\' 9" )   Wt 71.5 kg (157 lb 10.1 oz)   SpO2 92%   BMI 23.28 kg/m    Physical Exam:  GENERAL: Ill-appearing, cachectic  EYES: Sclera anicteric. Conjunctivae pink.  ENT: Oral mucosa moist.  NECK: Trachea midline. Neck veins flat. No adenopathy.  HEART: RRR. Normal S1, S2. No murmur appreciated.  CHEST: Breath sounds are diminished bilaterally.  Room air  ABDOMEN: Soft. Non-tender. Non-distended. BS+  GU: Deferred.  RECTAL: Deferred.  MUSCULOSKELETAL: no joint tenderness,  deformities or swelling  EXTREMITIES:No edema. No clubbing. No cyanosis.  SKIN: No rash or lesion.  NEURO: Sleeping, confused, oriented x 1 when awake, intermittently restless   Labs / Radiology   Lab and diagnostics: reviewed in Epic  Recent Labs   Lab 05/27/23  0533 05/27/23  0356   WBC  --  6.03   Hemoglobin 8.0* 7.3*   Hematocrit 25.1* 22.5*   Platelet Count  --  123*       Recent Labs   Lab 05/24/23  0555   PT 15.9*   INR 1.4   PTT 30        Recent Labs   Lab 05/26/23  1113 05/26/23  0451   Sodium  --  142   Potassium 4.5 5.8*   Chloride  --  111   CO2  --  22   BUN  --  29*   Creatinine  --  1.3   GFR  --  51.9*   Glucose  --  117*   Calcium  --  8.3     Recent Labs   Lab 05/20/23  2313   Bilirubin, Total 0.5   Protein, Total 5.4*   Albumin 2.9*   ALT 14   AST (SGOT) 19          XR Chest AP Portable    Result Date: 05/21/2023   Enteric tube extends into the distal stomach/pylorus.  Aerik Bandura 05/21/2023 9:43 PM    Embolization Arterial General    Result Date: 05/21/2023   1. No active bleeding identified. 2. Empiric Gelfoam embolization of the sigmoid artery and its branches. 3.  Empiric coil embolization of the superior rectal artery. Larrie Kass, MD 05/21/2023 11:15 AM    CT Angiogram Abdomen Pelvis    Result Date: 05/21/2023   1.Active hemorrhage within the sigmoid colon likely due to sigmoid diverticulosis. Recommend interventional radiology consultation to guide further management. 2. There is no definite evidence of acute inflammatory process. 3. Incidental note is made of gallstones, left renal calculi, prostate enlargement, and abnormal thickening of the urinary bladder raising concern for hypertrophy. 4. The possibility of bladder neoplasm cannot be excluded. Consider urology consultation to guide further management. 5. Other findings as noted above. 6. Critical findings were discussed with, and read back by, Dr. Rennis Harding at the following time 05/21/2023 1:05 AM. Miguel Dibble, MD 05/21/2023 1:09 AM    XR Chest   AP Portable    Result Date: 05/21/2023  1.Diffuse interstitial prominence may be secondary to mild pulmonary edema. 2.Patchy airspace opacities in the lung bases may represent atelectasis or pneumonia versus underlying interstitial lung disease. Judd Gaudier, MD 05/21/2023 12:54 AM    CT Head without Contrast    Result Date: 05/21/2023   No acute intracranial abnormality. Vernie Murders, MD 05/21/2023 12:29 AM        Signed by: Dory Peru, NP  [facility]   [phone]             [1]   Current Facility-Administered Medications   Medication Dose    acetaminophen  650 mg    dextrose  15 g of glucose    Or    dextrose  12.5 g    Or    dextrose  12.5 g    Or    glucagon (rDNA)  1 mg    haloperidol  1 mg    haloperidol lactate  1 mg    magnesium sulfate  1 g    melatonin  3 mg    OLANZapine  2.5 mg    oxyCODONE  2.5 mg    oxyCODONE  5 mg    potassium & sodium phosphates  2 packet    potassium chloride  0-60 mEq    Or    potassium chloride  0-60 mEq    Or    potassium chloride  10 mEq    traMADol  50 mg   [2]   Allergies  Allergen Reactions    Penicillins      Disorientation- per patient, no allergy as of 03/7023

## 2023-05-27 NOTE — Discharge Instr - AVS First Page (Addendum)
Reason for your Hospital Admission:  Lower GI bleed  Delirium    Instructions for after your discharge:  Follow with PCP after discharge  Stop Eliquis to be reassessed by PCP  Follow with hospice

## 2023-05-27 NOTE — Progress Notes (Signed)
Nutritional Support Services  Nutrition Follow-up    Twyman Prusak 89 y.o. male   MRN: [id number]    Summary of Nutrition Recommendations:    Continue to follow SLP recommendations for diet advancement  Magic Cup TID to help meet estimated needs. Each [employer] provides an additional 290 kcal and 9 gm protein.  Monitor wt trends  Monitor bowel function    -----------------------------------------------------------------------------------------------------------------                                                       ASSESSMENT DATA     Subjective Nutrition:   Pt seen at bedside. Alert but not oriented. Nodded to some questions but unsure if fully understood. NGT removed, SLP following and recommended full liquid, thickened. Palliative following and not recommending NGT replacement given dementia. Sitter reports fair PO intake and tolerance to PO diet. NFPE does show signs of loss however as pt of advanced age and decreased functional status, in addition to pt receiving full TF for past few days, unable to clearly diagnose malnutrition at this time    Learning Needs: no appropriate at this time    Events of Current Admission:  89 y.o. male with a history of dementia, PVD, chronic anticoagulation due to prior DVT, admitted with GI bleed. Course complicated due to delirium on dementia.   Medical Hx:  has a past medical history of Closed fracture of left side of maxilla (04/03/2017), DVT (deep venous thrombosis), Facial contusion, initial encounter (04/03/2017), Gastroesophageal reflux disease, Hyperlipemia, Left orbit fracture, closed, initial encounter (04/03/2017), and PVD (peripheral vascular disease).     Orders Placed This Encounter   Procedures    Adult diet Therapeutic/ Modified; Full liquid; Moderately thick (IDDSI level 3)    Magic Cup Quantity: A. One; Frequency: TID (3 times a day) with meals      05/26/23 0800 05/26/23 1300 05/26/23 1800   Intake (mL)   Percent Meal Consumed (%) 100% 100% 100%      ANTHROPOMETRIC  Height: 175.3 cm (5\' 9" )  Weight: 71.5 kg (157 lb 10.1 oz)  Weight Change: -1.38  IBW/kg (Calculated) Male: 72.75 kg  Body mass index is 23.28 kg/m.     Weight History Summary   Weight Monitoring     Weight Weight Method   01/05/2022 75.751 kg     07/16/2022 76.522 kg     07/23/2022 73.846 kg     08/14/2022 74.208 kg     12/04/2022 74.844 kg     04/01/2023 72.122 kg     05/20/2023 70.67 kg  Stated    05/22/2023 71.4 kg  Bed Scale    05/23/2023 72.1 kg  Bed Scale    05/24/2023 75.6 kg  Bed Scale    05/25/2023 72.1 kg  Bed Scale    05/26/2023 72.5 kg  Bed Scale    05/27/2023 71.5 kg  Bed Scale      ESTIMATED NEEDS  Total Daily Energy Needs: 1785 to 2142 kcal  Method for Calculating Energy Needs: 25 kcal - 30 kcal per kg  at 71.4 kg (Actual body weight)  Rationale: non vent, BMI     Total Daily Protein Needs: 85.68 to 107.1 g  Method for Calculating Protein Needs: 1.2 g - 1.5 g per kg at 71.4 kg (Actual body weight)  Rationale: non  vent, BMI    Total Daily Fluid Needs: 1785 to 2142 ml  Method for Calculating Fluid Needs: 1 ml per kcal energy = 1785 to 2142 kcal  Rationale: or per team    Pertinent Medications:   Current Facility-Administered Medications   Medication Dose Route Frequency    aspirin  81 mg Oral Daily    atorvastatin  40 mg Oral Daily    gabapentin  100 mg Oral Q8H SCH    lidocaine  1 patch Transdermal Q24H    pantoprazole  40 mg Oral Daily    QUEtiapine  25 mg Oral QHS    senna-docusate  1 tablet Oral Q12H [facility]    tamsulosin  0.4 mg Oral Daily after dinner     PRN Meds given in the past 48 hours:MgSulf 11/30    Pertinent labs:  Recent Labs   Lab 05/26/23  1113 05/26/23  0451 05/25/23  0407 05/24/23  0555 05/23/23  0635 05/22/23  0305   Sodium  --  142 139 140 141 139   Potassium 4.5 5.8* 4.4 4.2 4.1 3.7   Chloride  --  111 111 112* 115* 113*   CO2  --  22 22 23 18 21    BUN  --  29* 27 20 16 14    Creatinine  --  1.3 1.3 1.4 1.3 1.1   Glucose  --  117* 131* 127* 94 129*   Calcium  --  8.3 7.8*  7.8* 8.2 8.0   Magnesium  --  2.1 1.9 2.0 1.9 1.4*   GFR  --  51.9* 51.9* 47.4* 51.9* >60.0     Recent Labs   Lab 05/24/23  1521 05/21/23  1941   Whole Blood Glucose POCT 134* 129*     Physical Assessment: Date 12/2  Head: temple region: hollowing, scooping, depression with little to no muscle tone/resistance (severe muscle loss - temporalis), orbital region: slightly dark circles, somewhat hollow look, some decrease in bounce back of fat pads (moderate fat loss), and buccal region: slight depression, somewhat sunken appearance, flat cheeks, decrease in bounce back of fat pads (moderate fat loss)  Upper Body: clavicle bone region: some protrusion of the clavicle with decrease in muscle tone/resistance (moderate muscle loss - pectoralis major), shoulder and acromion bone region: slight protrusion of acromion process, decrease in muscle tone/resistance (moderate muscle loss - deltoid), and upper arm region: some fat in pinch between fingers, but not ample (moderate fat loss); pt in mittens  Lower Body: anterior thigh and patellar region: noticeable depressions along the thigh, patella prominent, square appearance, minimal to no muscle tone/resistance in quadriceps to patella (severe muscle loss - quadriceps) and posterior calf region: some shape to the bulb, but not well-developed, decrease in muscle tone/resistance (moderate muscle loss - gastrocnemius)  Edema: edema: no sign of fluid accumulation   Skin: Sx site  GI function: soft; LBM                                                     NUTRITION DIAGNOSIS     Inadequate Protein-energy intake related to dementia/agitation as evidenced by NPO status and need for TF regimen- complete     Swallowing Difficulty related to concern for dysphagia as evidenced by SLP evaluation and recommendations. - new    Pt is at low threshold for malnutrition;  will continue to monitor for 2 qualifying criteria.                                                           INTERVENTION      Nutrition recommendation - Please refer to top of note                                                     MONITORING/EVALUATION     Goal:  Patient to meet >75% of estimated needs through PO on f/u- active, updated    Nutrition Risk Level: High (will follow up at least 2 times per week and PRN)     Landree Fernholz S. Allena Katz MS, RD, CNSC  Clinical Dietitian

## 2023-05-27 NOTE — Plan of Care (Addendum)
NURSING SHIFT NOTE     Patient: Dillon Wood [facility]  Day: 6      SHIFT EVENTS     Shift Narrative/Significant Events (PRN med administration, fall, RRT, etc.):     Pt is alert and oriented to self, continue to be confused and agitated at times, improved with following commands; sitter in place, bilateral mitt restrains discontinued at 1400, family aware of the situation, PRN ZyPREXA and Haldol given for agitation, PRN tramadol administered for R leg pain; Pt had decreased appetite today, repositioning and personal hygiene provided.   POC is to discharge on hospice tomorrow.  Safety and fall precautions remain in place. Purposeful rounding completed.          ASSESSMENT     Changes in assessment from patient's baseline this shift:    Neuro: Yes intermittently confused/agitated  CV: No  Pulm: No  Peripheral Vascular: No  HEENT: No  GI: No  BM during shift: No, Last BM: Last BM Date: 05/26/23  GU: No   Integ: No  MS: No    Pain: Improved  Pain Interventions: Medications  Medications Utilized: tramadol po    Mobility: PMP Activity: Step 3 - Bed Mobility of Distance Walked (ft) (Step 6,7): 0 Feet           Lines     Patient Lines/Drains/Airways Status       Active Lines, Drains and Airways       Name Placement date Placement time Site Days    Peripheral IV 05/20/23 18 G Left Antecubital 05/20/23  2323  Antecubital  6    External Urinary Catheter 05/27/23  1751  --  less than 1                         VITAL SIGNS     Vitals:    05/27/23 1600   BP: 105/61   Pulse: 92   Resp: 20   Temp: 98.7 F (37.1 C)   SpO2: 92%       Temp  Min: 97.5 F (36.4 C)  Max: 99.3 F (37.4 C)  Pulse  Min: 85  Max: 95  Resp  Min: 18  Max: 20  BP  Min: 92/55  Max: 116/60  SpO2  Min: 91 %  Max: 94 %      Intake/Output Summary (Last 24 hours) at 05/27/2023 1811  Last data filed at 05/27/2023 1700  Gross per 24 hour   Intake 440 ml   Output 1150 ml   Net -710 ml          CARE PLAN        Problem: Moderate/High Fall Risk Score >5  Goal: Patient  will remain free of falls  Outcome: Progressing  Flowsheets (Taken 05/27/2023 0730)  High (Greater than 13):   HIGH-Visual cue at entrance to patient's room   HIGH-Bed alarm on at all times while patient in bed   HIGH-Utilize chair pad alarm for patient while in the chair   HIGH-Apply yellow "Fall Risk" arm band   HIGH-Pharmacy to initiate evaluation and intervention per protocol   HIGH-Initiate use of floor mats as appropriate   HIGH-Consider use of low bed     Problem: Safety  Goal: Patient will be free from injury during hospitalization  Outcome: Progressing  Flowsheets (Taken 05/26/2023 2245 by Elby Beck, Faith, RN)  Patient will be free from injury during hospitalization:   Assess patient's risk for falls and implement  fall prevention plan of care per policy   Provide and maintain safe environment   Use appropriate transfer methods   Ensure appropriate safety devices are available at the bedside   Include patient/ family/ care giver in decisions related to safety   Hourly rounding   Assess for patients risk for elopement and implement Elopement Risk Plan per policy   Provide alternative method of communication if needed (communication boards, writing)  Goal: Patient will be free from infection during hospitalization  Outcome: Progressing  Flowsheets (Taken 05/25/2023 2359 by Elby Beck, Faith, RN)  Free from Infection during hospitalization:   Assess and monitor for signs and symptoms of infection   Monitor lab/diagnostic results   Monitor all insertion sites (i.e. indwelling lines, tubes, urinary catheters, and drains)   Encourage patient and family to use good hand hygiene technique     Problem: Pain  Goal: Pain at adequate level as identified by patient  Outcome: Progressing  Flowsheets (Taken 05/26/2023 2245 by Elby Beck, Faith, RN)  Pain at adequate level as identified by patient:   Identify patient comfort function goal   Assess for risk of opioid induced respiratory depression, including snoring/sleep apnea.  Alert healthcare team of risk factors identified.   Assess pain on admission, during daily assessment and/or before any "as needed" intervention(s)   Reassess pain within 30-60 minutes of any procedure/intervention, per Pain Assessment, Intervention, Reassessment (AIR) Cycle   Evaluate if patient comfort function goal is met   Evaluate patient's satisfaction with pain management progress   Offer non-pharmacological pain management interventions   Consult/collaborate with Pain Service   Consult/collaborate with Physical Therapy, Occupational Therapy, and/or Speech Therapy   Include patient/patient care companion in decisions related to pain management as needed     Problem: Non-Violent Restraints Interdisciplinary Plan  Goal: Will be injury free during the use of non-violent restraints  Outcome: Progressing  Flowsheets (Taken 05/26/2023 2245 by Elby Beck, Faith, RN)  Will be injury free during the use of non-violent restraints:   Attempt all alternatives before use of restraints   Initiate least restrictive type of restraint that is effective   Provide and maintain safe environment   Notify family of initiation of restraints   Include patient/family/caregiver in decisions related to safety   Ensure safety devices are properly applied and maintained   Document observed patient actions according to protocol   Nurse to accompany patient off unit when on restraints   Remove restraints before the indicated maximum length of time when meets criteria for discontinuation   Document significant changes in patient condition   Provide debriefing as soon as possible and appropriate   Reassess need for continued restraints   Ensure that order for restraints has not expired     Problem: Pain interferes with ability to perform ADL  Goal: Pain at adequate level as identified by patient  Outcome: Progressing  Flowsheets (Taken 05/26/2023 2245 by Elby Beck, Faith, RN)  Pain at adequate level as identified by patient:   Identify patient  comfort function goal   Assess for risk of opioid induced respiratory depression, including snoring/sleep apnea. Alert healthcare team of risk factors identified.   Assess pain on admission, during daily assessment and/or before any "as needed" intervention(s)   Reassess pain within 30-60 minutes of any procedure/intervention, per Pain Assessment, Intervention, Reassessment (AIR) Cycle   Evaluate if patient comfort function goal is met   Evaluate patient's satisfaction with pain management progress   Offer non-pharmacological pain management interventions  Consult/collaborate with Pain Service   Consult/collaborate with Physical Therapy, Occupational Therapy, and/or Speech Therapy   Include patient/patient care companion in decisions related to pain management as needed

## 2023-05-27 NOTE — Consults (Addendum)
Order acknowledged. See note below     LOS # 6      Summary of Discharge Plan: DCP: Return to [facility] with [facility] hospice      Identified Possible Discharge Barriers: Medical condition       CM Interventions and Outcome: CM entered referral for home hospice in [place].     Addendum 1431: CM called [facility] [phone] and spoke with the pt's nurse. CM confirmed with ALF that the pt will be going into memory care when he returns. ALF made aware that the pt will have hospice with [facility]. CM reported that the pt was on restraints and 1:1.     Addendum 1506: [facility] stretcher transport [phone] to arrive between 4pm-8pm. CM called [facility] hospice [phone], to confirm [facility] and DME delivery. Per Malachi Bonds, they were told that [facility] will be tomorrow.     Addendum 1532: [facility] stretcher transport cancelled. CM messaged Malachi Bonds in [place] to inform her that [place] will be tomorrow. Awaiting confirmation of hospital bed delivery and [facility] for 05/28/23. CM to follow up.       Discussed above Discharge Plan with (patient, family, Care Team, others): Care Team      Case Management will continue to follow on patient's discharge needs.    Dot Lanes, MSW  Case Manager Social Worker I  [facility]

## 2023-05-27 NOTE — Progress Notes (Signed)
Apparently patient's family is still hoping to get PT for at least re-eval despite potential change in the goal of care.  In addition, hospice cannot be set up until tomorrow.  Will therefore cancel discharge and re-evaluate tomorrow.  If family ascertains comfort measure only status and hospice is in place including equipments, will discharge.

## 2023-05-27 NOTE — Progress Notes (Addendum)
[facility] stretcher transport is going to [facility] [place], [place] [address] into [address] with 1L of 02, is set up for today between 4:00 pm-8:00 pm under trip [phone]. [place] is pending.    Addendum at 3:38 pm:  Per Case Manager to call and cancel the transport for today.  Called [facility] at [phone] and spoke to [facility] who cancelled the transport for today.     Brock Bad, CMS

## 2023-05-28 LAB — CBC
Absolute nRBC: 0 10*3/uL (ref <=0.00)
Hematocrit: 27.2 % — ABNORMAL LOW (ref 37.6–49.6)
Hemoglobin: 8.7 g/dL — ABNORMAL LOW (ref 12.5–17.1)
MCH: 30.4 pg (ref 25.1–33.5)
MCHC: 32 g/dL (ref 31.5–35.8)
MCV: 95.1 fL (ref 78.0–96.0)
MPV: 9.3 fL (ref 8.9–12.5)
Platelet Count: 146 10*3/uL (ref 142–346)
RBC: 2.86 10*6/uL — ABNORMAL LOW (ref 4.20–5.90)
RDW: 14 % (ref 11–15)
WBC: 9.45 10*3/uL (ref 3.10–9.50)
nRBC %: 0 /100{WBCs} (ref <=0.0)

## 2023-05-28 NOTE — Progress Notes (Signed)
05/28/23 1231   Medicare Checklist   Is this a Medicare patient? Yes   Patient received 1st IMM Letter? No   If LOS 3 days or greater, did patient received 2nd IMM Letter? Yes   Date of 2nd IMM Letter 05/28/23   Time of 2nd IMM letter 7782     Brock Bad, CMS

## 2023-05-28 NOTE — Progress Notes (Signed)
[facility]       Music Therapy Services   [phone]    Music Therapy Session Note      Patient: Dillon Wood    MRN#: [id number]     [id number]    Referred by: Palliative Care NP    Patient is agreeable to participation in the therapy session. Nursing clears patient for therapy.    Diagnosis: Acute blood loss anemia [D62]  GI bleed [K92.2]    Reason for Referral: delirium and agitation    Instruments/Activity: guitar, voice/receptive music listening    Patient preferred music:  age appropriate classics    Description of Session: Pt laying semi-supine in bed upon MT and MTI arrival. [name] present and cleared pt for services. Pt verbalizing short phrases, but was unable to communicate clearly. MTI played "Que Sera, Sera" and "Oh What A Beautiful Morning". Pt indicated that he did not want to hear any additional songs and MT concluded session.    Patient's Response to Treatment: Pt presented to session in a semi-somnolent state, though he appeared to be aware of external musical stimuli. Music was presented as an outlet for orientation and to address agitation. Pt was observed relaxing throughout session, moving around until he became comfortable. Pt would attempt to express needs verbally but was difficult to understand. Pt very clearly stated "beautiful" after second song. When asked if he would like to hear another song, he stated "no." Pt's affect appeared to relax as session progressed. Services to continue as appropriate.    Plan: Continue with Music Therapy service to address delirium and agitation.    Ileene Musa  Music Therapy Intern    Cira Rue, MMT, MT-BC, NMT

## 2023-05-28 NOTE — Progress Notes (Signed)
See flowsheet for detailed assessment.   Patient stable for discharge back to facility. Family at bedside and agreeable with plan. Transport to bedside around 1840. Patient discharged.

## 2023-05-28 NOTE — Plan of Care (Signed)
Problem: Moderate/High Fall Risk Score >5  Goal: Patient will remain free of falls  Outcome: Progressing     Problem: Safety  Goal: Patient will be free from injury during hospitalization  Outcome: Progressing  Goal: Patient will be free from infection during hospitalization  Outcome: Progressing     Problem: Pain  Goal: Pain at adequate level as identified by patient  Outcome: Progressing     Problem: Inadequate Gas Exchange  Goal: Adequate oxygenation and improved ventilation  Outcome: Progressing     Problem: Pain interferes with ability to perform ADL  Goal: Pain at adequate level as identified by patient  Outcome: Progressing

## 2023-05-28 NOTE — Progress Notes (Signed)
Medicaid transportation eta 1:30 - 5: 30 pm ( see cm note)   05/28/23 1525   Discharge Disposition   Patient preference/choice provided? Yes   Physical Discharge Disposition Other (Comment)  ([facility] - Memory Care Unit and Hospice services)   Outpatient Services   Services   [facility] H[facility] services at [facility])   CM Interventions   Multidisciplinary rounds/family meeting before d/c? Yes     Marylou Mccoy RN BSN CM I  [facility]  [phone]

## 2023-05-28 NOTE — Progress Notes (Signed)
Rosina Lowenstein, MD    Dory Peru, NP    [address] [place], NP    [place]Sun   [phone]              Palliative Care Progress Note   Date Time: 05/28/23 9:26 AM   Patient Name: Dillon Wood, Dillon Wood   Location: [id number]/W1027-O   Attending Physician: Gilman Buttner, MD   Primary Care Physician: Marisa Sprinkles, MD   Consulting Provider: Dory Peru, NP   Consulting Service: Palliative Medicine        Advance Care Planning   05/28/23    CODE STATUS: NO CPR  -  ALLOW NATURAL DEATH   Medical Decision Maker: Advance directives: Health Care Agent:  [name] (Daughter):  [phone]  [name] (Daughter):  [phone]  Nakari Deleo (son):  [phone]        Patient's or Decision Maker's Perspective, Wishes, and Goals for Treatment  -- Patient had a previously signed POLST document from January 2024.  Daughters [name] and Research scientist (physical sciences) at bedside both report the patient does not have the capacity make his own decisions and was not making his own decisions back in January.  They do not know how these documents were signed, patient should not have signed him in the first place.  -- Family does have medical power of attorney documents, they will try to provide a copy to the hospital.  Both daughters [name] as well as son [name] all state the patient should be DNR/DNI and that this accurately reflects patient's wishes.  -- Patient is from [facility] facility.  Plan will be to return where he is then going to be moved to memory care.  -- Patient's wife also has dementia and is currently living with patient at the facility.  -- Patient has been mobile mostly with the use of an electric scooter but has been able to get OOB and walk short distances with assistance.  Has had worsening sundowning but patient is also very hard of hearing and this may be contributing.  -- Family is not ready for hospice services, family states that when patient  is doing well and at baseline he has good quality of life.  Hope is that this hospital stay is for the acute issue only and can then get him home.  -- Palliative care team remains available for continued ACP/GOC conversations    12/1: Extended conversation with patient's daughter [name] and son [name] over the phone.  -- Family with multiple questions regarding differences between hospice and pursuing PT/OT services at facility.  -- Patient does have hospital-acquired delirium in the setting of advanced/end-stage dementia.  Plan is to return to [facility] where he has been a resident.  Hope is that patient's delirium would improve in a familiar environment.  -- Disease progression of dementia was discussed.  Discussed high risk of recurrent hospitalizations due to risk of pneumonia, UTIs, dehydration which would not be unexpected at this stage.  --[name] and [name] believe that patient would not want to return back to the hospital again, believes that comfort measures would likely be the most appropriate direction at this time.  -- They are going to talk with their sibling [name] and discuss patient's overall status, options and what they believe patient would want.  -- Family confirms that there will be no attempts at replacing NGT.  [name] and [name] are okay with as needed medications for comfort.  --[name] will be present at bedside tomorrow afternoon and specifically request this  provider to follow-up for final discussion/decisions regarding hospice services.  -- Medical attending updated.    12/2Orlene Erm with patient daughter [name] over the phone.  -- Daughter states that family has had conversations and has ultimately decided to pursue hospice services at [facility].  -- Family is already informed to the facility, made initial contact with the hospice provider:  [facility]  -- Family wishes to continue basic medical treatment while awaiting discharge back to facility with hospice services.  -- CODE  STATUS changed to DNR-AND per family request.  -- Medical team as well as case management made aware.  Hospice order in place      Time in Time Out Total Time mins spent on Advance Care Planning Services with voluntary consent from participants. No active management of the problems listed above was undertaken during the time  period reported.            Assessment  and Plan   D[facility] 89 y.o. 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        AMS  Chronic Back Pain  Progressive Anorexia-Cachexia  Debility  Acute lower GI bleed secondary to sigmoid diverticulosis  --S/p Gelfoam embolization of the IMA and coil placement in superior rectal artery  Hemorrhagic shock  Dementia  Acute blood loss anemia  Protein energy malnutrition  Hospice eligible--> family choosing discharge with hospice services.     Plan   Physical symptoms:     #AMS--fluctuates  -- Adequate oxygenation  -- Pain management  -- Correct anemia  -- Delirium precautions  -- Haldol 1mg  sublingual every 8 hours as needed moderate agitation/restless  -- Haldol 1mg  IV every 4 hours as needed severe agitation/restless  -- Hold Haldol for prolonged Qtc  -- Constipation prophylaxis     #Chronic Back Pain--no pain medication at home--controlled  -- lidocaine patch to back every 24 hours  -- Ultram 50 mg p.o. every 6 hours as needed moderate pain  -- Oxycodone 5 mg p.o. every 4 hours as needed severe pain  -- Constipation prophylaxis     #Progressive Anorexia-Cachexia  -- SLP evaluation  -- Dietary/nutrition consult calorie optimization  -- Calorie dense meals  -- Pleasure feeds  --NGT pulled out by patient--> do not recommend replacing  -- Artificial nutrition not recommended in setting of progressive dementia       #Progressive Debility  -- Pain management  -- Turn and  reposition for comfort  -- PT/OT as medically appropriate      Psychosocial :      Patient supported by his wife, 4 children      Based on my assessment at time, estimated Prognosis: Weeks to months. Prognosis can change over time.       PC Team follow-up plans: tomorrow  Discharge Disposition: Hospice - home  Outpatient Follow Up Recommended: Yes  Chief Complaint   F/u    Interval History   Awake, sitting up in bed at time of visit with noted restlessness.  Safety sitter at bedside.  Zyprexa x 1, Haldol x 1 given in past 24 hours.  Patient is eating most of meals with assistance, is taking oral medications.       Review of Systems   Review of Systems   Unable to perform ROS: Dementia               Medications   Scheduled Meds  Current Facility-Administered Medications   Medication Dose Route Frequency    aspirin  81 mg Oral Daily    atorvastatin  40 mg Oral Daily    gabapentin  100 mg Oral Q8H SCH    lidocaine  1 patch Transdermal Q24H    pantoprazole  40 mg Oral Daily    QUEtiapine  25 mg Oral QHS    senna-docusate  1 tablet Oral Q12H SCH    tamsulosin  0.4 mg Oral Daily after dinner      DRIPS     PRN MEDS  PRN Medications[1]    Allergies   Allergies[2]    Physical Exam   BP 123/73   Pulse 92   Temp 99 F (37.2 C) (Oral)   Resp 16   Ht 1.753 m (5\' 9" )   Wt 71.3 kg (157 lb 3 oz)   SpO2 95%   BMI 23.21 kg/m    Physical Exam:  GENERAL: Ill-appearing, cachectic  EYES: Sclera anicteric. Conjunctivae pink.  ENT: Oral mucosa moist.  NECK: Trachea midline. Neck veins flat. No adenopathy.  HEART: RRR. Normal S1, S2. No murmur appreciated.  CHEST: Breath sounds are diminished bilaterally.  Room air  ABDOMEN: Soft. Non-tender. Non-distended. BS+  GU: Deferred.  RECTAL: Deferred.  MUSCULOSKELETAL: no joint tenderness, deformities or  swelling  EXTREMITIES:No edema. No clubbing. No cyanosis.  SKIN: No rash or lesion.  NEURO: Awake, confused, oriented x 1 when awake, intermittently restless   Labs / Radiology   Lab and diagnostics: reviewed in Epic  Recent Labs   Lab 05/28/23  0601   WBC 9.45   Hemoglobin 8.7*   Hematocrit 27.2*   Platelet Count 146       Recent Labs   Lab 05/24/23  0555   PT 15.9*   INR 1.4   PTT 30        Recent Labs   Lab 05/26/23  1113 05/26/23  0451   Sodium  --  142   Potassium 4.5 5.8*   Chloride  --  111   CO2  --  22   BUN  --  29*   Creatinine  --  1.3   GFR  --  51.9*   Glucose  --  117*   Calcium  --  8.3                XR Chest AP Portable    Result Date: 05/21/2023   Enteric tube extends into the distal stomach/pylorus.  Devean Hempel 05/21/2023 9:43 PM        Signed by: Dory Peru, NP  [facility]   [phone]             [1]   Current Facility-Administered Medications   Medication Dose    acetaminophen  650 mg    dextrose  15 g of glucose    Or    dextrose  12.5 g  Or    dextrose  12.5 g    Or    glucagon (rDNA)  1 mg    haloperidol  1 mg    haloperidol lactate  1 mg    magnesium sulfate  1 g    melatonin  3 mg    naloxone  0.4 mg    OLANZapine  2.5 mg    oxyCODONE  2.5 mg    oxyCODONE  5 mg    potassium & sodium phosphates  2 packet    potassium chloride  0-60 mEq    Or    potassium chloride  0-60 mEq    Or    potassium chloride  10 mEq    traMADol  50 mg   [2]   Allergies  Allergen Reactions    Penicillins      Disorientation- per patient, no allergy as of 03/7023

## 2023-05-28 NOTE — Plan of Care (Signed)
Problem: Moderate/High Fall Risk Score >5  Goal: Patient will remain free of falls  05/28/2023 1845 by Patton Salles, RN  Outcome: Adequate for Discharge  05/28/2023 1329 by Patton Salles, RN  Outcome: Progressing     Problem: Safety  Goal: Patient will be free from injury during hospitalization  05/28/2023 1845 by Patton Salles, RN  Outcome: Adequate for Discharge  05/28/2023 1329 by Patton Salles, RN  Outcome: Progressing  Goal: Patient will be free from infection during hospitalization  05/28/2023 1845 by Patton Salles, RN  Outcome: Adequate for Discharge  05/28/2023 1329 by Patton Salles, RN  Outcome: Progressing     Problem: Pain  Goal: Pain at adequate level as identified by patient  05/28/2023 1845 by Patton Salles, RN  Outcome: Adequate for Discharge  05/28/2023 1329 by Patton Salles, RN  Outcome: Progressing     Problem: Inadequate Gas Exchange  Goal: Adequate oxygenation and improved ventilation  05/28/2023 1845 by Patton Salles, RN  Outcome: Adequate for Discharge  05/28/2023 1329 by Patton Salles, RN  Outcome: Progressing     Problem: Altered GI Function  Goal: Fluid and electrolyte balance are achieved/maintained  Outcome: Adequate for Discharge  Goal: Elimination patterns are normal or improving  Outcome: Adequate for Discharge     Problem: Non-Violent Restraints Interdisciplinary Plan  Goal: Will be injury free during the use of non-violent restraints  Outcome: Adequate for Discharge     Problem: Compromised Sensory Perception  Goal: Sensory Perception Interventions  Outcome: Adequate for Discharge     Problem: Compromised Moisture  Goal: Moisture level Interventions  Outcome: Adequate for Discharge     Problem: Compromised Activity/Mobility  Goal: Activity/Mobility Interventions  Outcome: Adequate for Discharge     Problem: Compromised Nutrition  Goal: Nutrition Interventions  Outcome: Adequate for Discharge     Problem: Compromised Friction/Shear  Goal: Friction and Shear Interventions  Outcome: Adequate for  Discharge     Problem: Fluid and Electrolyte Imbalance/ Endocrine  Goal: Fluid and electrolyte balance are achieved/maintained  Outcome: Adequate for Discharge     Problem: Bleeding Precautions  Goal: Free from bleeding  Outcome: Adequate for Discharge     Problem: Violent or Self-destructive Restraints Interdisciplinary Plan  Goal: Will be injury free during the use of restraints  Outcome: Adequate for Discharge     Problem: Pain interferes with ability to perform ADL  Goal: Pain at adequate level as identified by patient  05/28/2023 1845 by Patton Salles, RN  Outcome: Adequate for Discharge  05/28/2023 1329 by Patton Salles, RN  Outcome: Progressing     Problem: Side Effects from Pain Analgesia  Goal: Patient will experience minimal side effects of analgesic therapy  Outcome: Adequate for Discharge

## 2023-05-28 NOTE — Discharge Summary (Signed)
[facility]      Patient: [facility]  Admission Date: 05/20/2023   DOB: [birth year]  Discharge Date: 05/28/2023    MRN: [id number]  Discharge Attending:Korver Graybeal Audley Hose, MD   Referring Physician: Pcp, None, MD  PCP: Pcp, None, MD       DISCHARGE SUMMARY     Discharge Information   Admission Diagnosis:   GI bleed    Discharge Diagnosis:   1.  Presenting lower GI bleed leading to acute blood loss anemia, felt to be due to diverticular bleed  2.  Hemorrhagic shock  3.  Delirium on top of dementia  4.  Dysphagia due to above, improved  5.  Incidental finding of urinary bladder wall thickening  6.  Acute thrombocytopenia, consumptive  7.  Reactive leukocytosis  8.  Stage IIIb chronic kidney disease  9.  Peripheral vascular disease  10.  Prior history of DVT     Discharge Condition: Stable  Consultants: Interventional radiology, gastroenterology  Discharged to: Assisted living with hospice       Hospital Course   Presentation History   Quote from Medical H&P:    "Dillon Wood is a 89 y.o. 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  No history of GI bleeds in the past.  In ED patient was afebrile, heart rate 90, blood pressure 75/48, satting 96% on room air with no respiratory distress, labs notable for hemoglobin 9.1, platelets 116, creatinine 1.6, lactate 2.3, INR 2.0, PTT 22.  Electrolytes, LFTs, and troponin within normal limits.  While in the ED patient had large-volume BRBPR with significant clot burden, repeat CBC with hemoglobin 7.7.  CXR with patchy airspace opacities, CT head negative for acute intracranial findings.  CTA abdomen pelvis with active hemorrhage in the sigmoid colon likely due to diverticulosis.  In ED patient received 2 L IVF, Kcentra for apixaban reversal, IV TXA, and  2 units PRBC with improvement in blood pressure, lactate normalized. Patient went emergently to IR with Dr. Excell Seltzer, who noted no active extravasation noted. Empiric Gelfoam embolization of sigmoid branches of the IMA performed. Single coil placed in the superior rectal artery as well. Hypotensive in IR suite. MTP activated. Admitted to ICU postprocedure.      [facility] lives in [facility] community with his wife"    See HPI for details.    Hospital Course (7 Days)   Dillon Wood is a 89 y.o. male with a history of peripheral vascular disease status post stent placement, dementia, prior history of DVT on anticoagulation, prior history of skin cancer s/p resection, who presented with lower GI bleed.  Apparently he is incontinent on baseline, requiring diapers.  He was noted to have dried blood in underwear for few days.  Patient had episode of bright red blood per rectum in the bathroom on the day of presentation, and then had a ground-level fall.  He was noted to have large volume BRBPR with large clot burden, hemoglobin 7.7.  CTA showed active hemorrhage in the sigmoid colon likely diverticular bleed.  Patient was given IV fluids, blood transfusion, Kcentra for apixaban reversal.  He went to IR where no extravasation is noted.  He had empirical embolization of sigmoid branches of the IMA with single coil placed in superior rectal artery.  He was admitted to  the ICU where he did require Levophed for a time which improved.  He last had DVT episode in 2022, and may be safely taken off of apixaban.  His aspirin was continued and hemoglobin is stable with no signs of re-bleed.     Hospital course also significant for delirium/metabolic encephalopathy on baseline of dementia, at least moderate in degree.  Overall, mental status is are slowly improving but patient still having frequent sundowning, agitation, requiring sedation.  This was discussed with family, who decided to change goal of care to comfort oriented  approach.  He will go back to his assisted living facility on hospice.       Best Practices   Was the patient admitted with either a CHF Exacerbation or Pneumonia? NO     Progress Note/Physical Exam at Discharge     Subjective: The patient was seen at the bedside this morning.  He was alert but confused.  Patient was no longer on mittens.  Still has right leg pain but otherwise denies chest pain, abdominal pain, shortness of breath, nausea.  No other events reported from overnight.     Vitals:    05/27/23 1959 05/27/23 2353 05/28/23 0554 05/28/23 0713   BP: 126/70 124/71 101/68 123/73   Pulse: 93 93 90 92   Resp: 18   16   Temp:   97.5 F (36.4 C) 99 F (37.2 C)   TempSrc:   Axillary Oral   SpO2: 95% 95% 92% 95%   Weight:   71.3 kg (157 lb 3 oz)    Height:           GENERAL: In no acute distress  HEENT: Pupils equal and reactive; moist mucous membranes  CVS: Regular rate and rhythm, normal S1, S2; no murmurs, rubs, gallops  LUNGS: Clear to auscultation bilaterally; No wheezes, rhonchi, crackles  ABDOMEN: Soft; Non-tender, non-distended; positive bowel sounds  EXTREMITIES: No significant edema; no significant right leg findings  SKIN: Warm and dry  NEURO: Alert and oriented; No focal weakness noted       Diagnostics     Labs/Studies Pending at Discharge: No    Last Labs   Recent Labs   Lab 05/28/23  0601 05/27/23  0533 05/27/23  0356 05/26/23  0451   WBC 9.45  --  6.03 7.02   RBC 2.86*  --  2.38* 2.88*   Hemoglobin 8.7* 8.0* 7.3* 9.0*   Hematocrit 27.2* 25.1* 22.5* 27.3*   MCV 95.1  --  94.5 94.8   Platelet Count 146  --  123* 124*       Recent Labs   Lab 05/26/23  1113 05/26/23  0451 05/25/23  0407 05/24/23  0555 05/23/23  0635 05/22/23  0305   Sodium  --  142 139 140 141 139   Potassium 4.5 5.8* 4.4 4.2 4.1 3.7   Chloride  --  111 111 112* 115* 113*   CO2  --  22 22 23 18 21    BUN  --  29* 27 20 16 14    Creatinine  --  1.3 1.3 1.4 1.3 1.1   Glucose  --  117* 131* 127* 94 129*   Calcium  --  8.3 7.8* 7.8* 8.2 8.0    Magnesium  --  2.1 1.9 2.0 1.9 1.4*       Microbiology Results (last 15 days)       Procedure Component Value Units Date/Time    Stool Clostridioides difficile Toxin B, PCR [[id number]] Collected:  05/22/23 2120    Order Status: Canceled Specimen: Stool Updated: 05/22/23 2120    Culture, Urine [[id number]] Collected: 05/21/23 0908    Order Status: Completed Specimen: Urine, Clean Catch Updated: 05/22/23 1246     Culture Urine 1,000-9,000 CFU/mL Normal urogenital or skin microbiota     Comment: No further workup.       Culture, Methicillin Resistant Staphylococcus aureus (MRSA) [[id number]]  (Normal) Collected: 05/21/23 0629    Order Status: Completed Specimen: Swab from Nares Updated: 05/22/23 0722     Culture MRSA Surveillance No Methicillin Resistant Staphylococcus aureus isolated    Culture, Methicillin Resistant Staphylococcus aureus (MRSA) [[id number]]  (Normal) Collected: 05/21/23 0629    Order Status: Completed Specimen: Swab from Throat Updated: 05/22/23 0722     Culture MRSA Surveillance No Methicillin Resistant Staphylococcus aureus isolated    Culture, Blood, Aerobic And Anaerobic [[id number]] Collected: 05/20/23 2340    Order Status: Completed Specimen: Blood, Venous Updated: 05/26/23 0900     Culture Blood No growth at 5 days            Procedures/Imaging:   Upon my review: XR Chest AP Portable    Result Date: 05/21/2023   Enteric tube extends into the distal stomach/pylorus.  Concepcion Reesor 05/21/2023 9:43 PM         Patient Instructions   Discharge Diet: Regular  Discharge Activity: Home    Follow Up Appointment:   Follow-up Information       PCP Follow up.               hospice Follow up.                             Discharge Medications:     Medication List        START taking these medications      gabapentin 100 MG capsule  Commonly known as: NEURONTIN  Take 1 capsule (100 mg) by mouth 3 (three) times daily     lidocaine 5 %  Commonly known as: LIDODERM  Place 1 patch onto the skin every 24 hours Remove &  Discard patch within 12 hours or as directed by MD     LORazepam 1 MG/0.5ML Conc  Take 0.5 mg by mouth every 4 (four) hours as needed (for anxiety)     morphine 10 MG/5ML solution  Take 1.3 mLs (2.6 mg) by mouth every 2 (two) hours as needed for Pain     * naloxone 4 MG/0.1ML nasal spray  Commonly known as: NARCAN  1 spray intranasally. If pt does not respond or relapses into respiratory depression call 911. Give additional doses every 2-3 min.     * naloxone 4 MG/0.1ML nasal spray  Commonly known as: NARCAN  1 spray intranasally. If pt does not respond or relapses into respiratory depression call 911. Give additional doses every 2-3 min.     ondansetron 4 MG disintegrating tablet  Commonly known as: ZOFRAN-ODT  Take 1 tablet (4 mg) by mouth every 6 (six) hours as needed for Nausea     polyethylene glycol 17 g packet  Commonly known as: MIRALAX  Take 17 g by mouth 2 (two) times daily as needed (for constipation)     senna-docusate 8.6-50 MG per tablet  Commonly known as: PERICOLACE  Take 2 tablets by mouth 2 (two) times daily as needed for Constipation           * This list  has 2 medication(s) that are the same as other medications prescribed for you. Read the directions carefully, and ask your doctor or other care provider to review them with you.                CONTINUE taking these medications      aspirin EC 81 MG EC tablet  Take 1 tablet (81 mg total) by mouth daily     atorvastatin 40 MG tablet  Commonly known as: LIPITOR  Take 1 tablet (40 mg total) by mouth daily     desonide 0.05 % ointment  Commonly known as: DESOWEN  Apply topically 2 (two) times daily Apply bid to scalp/face until reaction improved     mupirocin 2 % ointment  Commonly known as: BACTROBAN  Apply bid to face/scalp lesions until healed     niacinamide 500 MG tablet     QUEtiapine 25 MG tablet  Commonly known as: SEROquel  Take 1 tablet (25 mg) by mouth nightly     tamsulosin 0.4 MG Caps  Commonly known as: FLOMAX     triamcinolone 0.1 %  ointment  Commonly known as: KENALOG  Apply bid to eczema on lower body until clear, then use PRN            STOP taking these medications      apixaban 5 MG  Commonly known as: ELIQUIS               Where to Get Your Medications        These medications were sent to [facility] [place], [place] - [address]  [address], [place] [address]      Phone: [phone]   gabapentin 100 MG capsule  lidocaine 5 %  LORazepam 1 MG/0.5ML Conc  morphine 10 MG/5ML solution  naloxone 4 MG/0.1ML nasal spray  naloxone 4 MG/0.1ML nasal spray  ondansetron 4 MG disintegrating tablet  polyethylene glycol 17 g packet  senna-docusate 8.6-50 MG per tablet             Time spent examining patient, discussing with patient/family regarding hospital course, chart review, reconciling medications and discharge planning: 40 minutes.    Joycelyn Rua, MD    10:59 AM 05/28/2023

## 2023-05-28 NOTE — Plan of Care (Addendum)
Pt is alert and oriented X1, disoriented to place, time and situation. Pt is combative and noncompliant. He removed his external cath and gown multiple times. Reoriented throughout the night. Sitter at bedside for safety. Pt complained of RLE and groin pain. Tramadol given with some relief. Pt took his medication whole with apple sauce. Safety precaution in place.     Problem: Moderate/High Fall Risk Score >5  Goal: Patient will remain free of falls  Outcome: Progressing  Flowsheets (Taken 05/27/2023 2000)  High (Greater than 13):   HIGH-Visual cue at entrance to patient's room   HIGH-Bed alarm on at all times while patient in bed   HIGH-Utilize chair pad alarm for patient while in the chair   HIGH-Apply yellow "Fall Risk" arm band   HIGH-Initiate use of floor mats as appropriate   HIGH-Pharmacy to initiate evaluation and intervention per protocol   HIGH-Consider use of low bed     Problem: Safety  Goal: Patient will be free from injury during hospitalization  Outcome: Progressing  Flowsheets (Taken 05/28/2023 0041)  Patient will be free from injury during hospitalization:   Provide and maintain safe environment   Assess patient's risk for falls and implement fall prevention plan of care per policy   Use appropriate transfer methods   Ensure appropriate safety devices are available at the bedside   Include patient/ family/ care giver in decisions related to safety   Hourly rounding     Problem: Pain  Goal: Pain at adequate level as identified by patient  Outcome: Progressing  Flowsheets (Taken 05/28/2023 0041)  Pain at adequate level as identified by patient:   Identify patient comfort function goal   Assess for risk of opioid induced respiratory depression, including snoring/sleep apnea. Alert healthcare team of risk factors identified.   Assess pain on admission, during daily assessment and/or before any "as needed" intervention(s)   Reassess pain within 30-60 minutes of any procedure/intervention, per Pain  Assessment, Intervention, Reassessment (AIR) Cycle   Evaluate if patient comfort function goal is met   Evaluate patient's satisfaction with pain management progress   Offer non-pharmacological pain management interventions   Consult/collaborate with Pain Service     Problem: Inadequate Gas Exchange  Goal: Adequate oxygenation and improved ventilation  Outcome: Progressing  Flowsheets (Taken 05/28/2023 0041)  Adequate oxygenation and improved ventilation:   Assess lung sounds   Monitor SpO2 and treat as needed   Monitor and treat ETCO2   Provide mechanical and oxygen support to facilitate gas exchange   Position for maximum ventilatory efficiency   Teach/reinforce use of incentive spirometer 10 times per hour while awake, cough and deep breath as needed   Plan activities to conserve energy: plan rest periods   Increase activity as tolerated/progressive mobility     Problem: Altered GI Function  Goal: Fluid and electrolyte balance are achieved/maintained  Outcome: Progressing  Flowsheets (Taken 05/28/2023 0041)  Fluid and electrolyte balance are achieved/maintained:   Monitor/assess lab values and report abnormal values   Assess and reassess fluid and electrolyte status   Observe for cardiac arrhythmias   Monitor for muscle weakness     Problem: Fluid and Electrolyte Imbalance/ Endocrine  Goal: Fluid and electrolyte balance are achieved/maintained  Outcome: Progressing  Flowsheets (Taken 05/28/2023 0041)  Fluid and electrolyte balance are achieved/maintained:   Monitor/assess lab values and report abnormal values   Assess and reassess fluid and electrolyte status   Observe for cardiac arrhythmias   Monitor for muscle weakness  Problem: Pain interferes with ability to perform ADL  Goal: Pain at adequate level as identified by patient  Outcome: Progressing  Flowsheets (Taken 05/28/2023 0041)  Pain at adequate level as identified by patient:   Identify patient comfort function goal   Assess for risk of opioid induced  respiratory depression, including snoring/sleep apnea. Alert healthcare team of risk factors identified.   Assess pain on admission, during daily assessment and/or before any "as needed" intervention(s)   Reassess pain within 30-60 minutes of any procedure/intervention, per Pain Assessment, Intervention, Reassessment (AIR) Cycle   Evaluate if patient comfort function goal is met   Evaluate patient's satisfaction with pain management progress   Offer non-pharmacological pain management interventions   Consult/collaborate with Pain Service

## 2023-05-28 NOTE — Consults (Signed)
Hospice Coordination Follow Up   Date: May 28, 2023   Patient Name: Dillon Wood  Location: R4270/W2376-E  Attending Physician: Gilman Buttner, MD          Outcome   CM completed as patient requested [facility].     Hospice Agency Choice: Other    Peggyann Juba, [facility] Coordinator  Available via secure chat or [phone]

## 2023-05-28 NOTE — Progress Notes (Addendum)
CM called [facility] [phone] and spoke with the Roel.CM confirmed with ALF that the pt will be going into memory care when he returns. ALF made aware that the pt will have hospice with [facility]. Roel confirmed that hospital bed has been delivered. He asked for d/c summary to be faxed [phone]).    @ 1:30 pm  [facility] stretcher transport [phone]) is going to [facility] [place], [place] [address] into [address] with 1L of 02, is set up for today between 1:30 pm and 5:30 pm under trip # [phone]. Cm spoke with Maretta Los.        Cm spoke with pt's daughter [name] and informed her of transportation time frame. Daughter upset since transportation was not arranged earlier in a day , saying that there is no nursing staff at [facility] after 5 pm. Cm called [facility] and spoke with Roel again who confirmed there is 24/7 nursing care available at the [facility] ( where pt is going). Cm called pt's son and informed him of this - son appreciative.    @3 :20 pm  Cm met with the pt's son at bedside, reviewed discharged plan and once again explained that pt is going to memory care unit at [facility] and that they have 24/7 nursing care.    Marylou Mccoy RN BSN CM I  [facility]  [phone]

## 2023-05-30 LAB — PREPARE FRESH FROZEN PLASMA
Expiration Date: 202411302236
Expiration Date: 202411302236
ISBT CODE: 7300
ISBT CODE: 7300
Product Status: TRANSFUSED

## 2023-05-31 ENCOUNTER — Emergency Department: Payer: Medicare Other | Admitting: Radiology

## 2023-05-31 ENCOUNTER — Emergency Department
Admission: EM | Admit: 2023-05-31 | Discharge: 2023-05-31 | Disposition: A | Discharge status: Home / Self Care | Payer: Medicare Other

## 2023-05-31 DIAGNOSIS — W19XXXA Unspecified fall, initial encounter: Secondary | ICD-10-CM

## 2023-05-31 DIAGNOSIS — S50312A Abrasion of left elbow, initial encounter: Secondary | ICD-10-CM | POA: Insufficient documentation

## 2023-05-31 DIAGNOSIS — W050XXA Fall from non-moving wheelchair, initial encounter: Secondary | ICD-10-CM | POA: Insufficient documentation

## 2023-05-31 DIAGNOSIS — S0081XA Abrasion of other part of head, initial encounter: Secondary | ICD-10-CM | POA: Insufficient documentation

## 2023-05-31 LAB — COMPREHENSIVE METABOLIC PANEL
ALT: 57 U/L — ABNORMAL HIGH (ref <=55)
AST (SGOT): 80 U/L — ABNORMAL HIGH (ref <=41)
Albumin/Globulin Ratio: 0.8 — ABNORMAL LOW (ref 0.9–2.2)
Albumin: 2.7 g/dL — ABNORMAL LOW (ref 3.5–5.0)
Alkaline Phosphatase: 76 U/L (ref 37–117)
Anion Gap: 12 (ref 5.0–15.0)
BUN: 30 mg/dL — ABNORMAL HIGH (ref 9–28)
Bilirubin, Total: 0.6 mg/dL (ref 0.2–1.2)
CO2: 21 meq/L (ref 17–29)
Calcium: 8.3 mg/dL (ref 7.9–10.2)
Chloride: 108 meq/L (ref 99–111)
Creatinine: 1.6 mg/dL — ABNORMAL HIGH (ref 0.5–1.5)
GFR: 40.4 mL/min/{1.73_m2} — ABNORMAL LOW (ref >=60.0)
Globulin: 3.6 g/dL (ref 2.0–3.6)
Glucose: 109 mg/dL — ABNORMAL HIGH (ref 70–100)
Potassium: 4.4 meq/L (ref 3.5–5.3)
Protein, Total: 6.3 g/dL (ref 6.0–8.3)
Sodium: 141 meq/L (ref 135–145)

## 2023-05-31 LAB — LAB USE ONLY - CBC WITH DIFFERENTIAL
Absolute Basophils: 0.03 10*3/uL (ref 0.00–0.08)
Absolute Eosinophils: 0.29 10*3/uL (ref 0.00–0.44)
Absolute Immature Granulocytes: 0.11 10*3/uL — ABNORMAL HIGH (ref 0.00–0.07)
Absolute Lymphocytes: 0.84 10*3/uL (ref 0.42–3.22)
Absolute Monocytes: 0.46 10*3/uL (ref 0.21–0.85)
Absolute Neutrophils: 9.81 10*3/uL — ABNORMAL HIGH (ref 1.10–6.33)
Absolute nRBC: 0 10*3/uL (ref <=0.00)
Basophils %: 0.3 %
Eosinophils %: 2.5 %
Hematocrit: 26.7 % — ABNORMAL LOW (ref 37.6–49.6)
Hemoglobin: 8.6 g/dL — ABNORMAL LOW (ref 12.5–17.1)
Immature Granulocytes %: 1 %
Lymphocytes %: 7.3 %
MCH: 30.2 pg (ref 25.1–33.5)
MCHC: 32.2 g/dL (ref 31.5–35.8)
MCV: 93.7 fL (ref 78.0–96.0)
MPV: 9.8 fL (ref 8.9–12.5)
Monocytes %: 4 %
Neutrophils %: 84.9 %
Platelet Count: 194 10*3/uL (ref 142–346)
Preliminary Absolute Neutrophil Count: 9.81 10*3/uL — ABNORMAL HIGH (ref 1.10–6.33)
RBC: 2.85 10*6/uL — ABNORMAL LOW (ref 4.20–5.90)
RDW: 14 % (ref 11–15)
WBC: 11.54 10*3/uL — ABNORMAL HIGH (ref 3.10–9.50)
nRBC %: 0 /100{WBCs} (ref <=0.0)

## 2023-05-31 MED ORDER — SODIUM CHLORIDE 0.9 % IV BOLUS
1000.0000 mL | Freq: Once | INTRAVENOUS | Status: AC
Start: 2023-05-31 — End: 2023-05-31
  Administered 2023-05-31: 1000 mL via INTRAVENOUS

## 2023-05-31 NOTE — ED Provider Notes (Addendum)
EMERGENCY DEPARTMENT HISTORY AND PHYSICAL EXAM      Patient Name: Dillon Wood  Age: 89 y.o. male  Encounter Date:  05/31/2023  Department:[facility] [place]  Patient Room: 14/14  PCP: Marisa Sprinkles, MD     History of Presenting Illness     Chief Complaint   Patient presents with    Fall    Head Injury       HPI   Dillon Wood is a 89 y.o. male who presents to ED for evaluation for fall with head strike on Eliquis.  EMS reports that caregiver suspects patient was getting up from wheelchair and then fell.  Patient is overall demented at baseline.  Per caregiver, patient's current mental state is currently baseline.    Medical History[1]    Past Surgical History[2]    Family History[3]    Social  Social History[4]         Allergies[5]    Home Medications       Med List Status: In Progress Set By: Verne Grain, RN at 05/31/2023  7:12 PM              aspirin EC 81 MG EC tablet     Take 1 tablet (81 mg total) by mouth daily     atorvastatin (LIPITOR) 40 MG tablet     Take 1 tablet (40 mg total) by mouth daily     desonide (DESOWEN) 0.05 % ointment     Apply topically 2 (two) times daily Apply bid to scalp/face until reaction improved     gabapentin (NEURONTIN) 100 MG capsule     Take 1 capsule (100 mg) by mouth 3 (three) times daily     lidocaine (LIDODERM) 5 %     Place 1 patch onto the skin every 24 hours Remove & Discard patch within 12 hours or as directed by MD     LORazepam 1 MG/0.5ML Conc (Expired)     Take 0.5 mg by mouth every 4 (four) hours as needed (for anxiety)     morphine 10 MG/5ML solution     Take 1.3 mLs (2.6 mg) by mouth every 2 (two) hours as needed for Pain     mupirocin (BACTROBAN) 2 % ointment     Apply bid to face/scalp lesions until healed     naloxone (NARCAN) 4 MG/0.1ML nasal spray     1 spray intranasally. If pt does not respond or relapses into respiratory depression call 911. Give additional doses every 2-3 min.     naloxone (NARCAN) 4 MG/0.1ML nasal spray     1 spray  intranasally. If pt does not respond or relapses into respiratory depression call 911. Give additional doses every 2-3 min.     niacinamide 500 MG tablet     Take 1 tablet (500 mg) by mouth 2 (two) times daily with meals     ondansetron (ZOFRAN-ODT) 4 MG disintegrating tablet     Take 1 tablet (4 mg) by mouth every 6 (six) hours as needed for Nausea     polyethylene glycol (MIRALAX) 17 g packet     Take 17 g by mouth 2 (two) times daily as needed (for constipation)     QUEtiapine (SEROquel) 25 MG tablet (Expired)     Take 1 tablet (25 mg) by mouth nightly     senna-docusate (PERICOLACE) 8.6-50 MG per tablet     Take 2 tablets by mouth 2 (two) times daily as needed for Constipation  tamsulosin (FLOMAX) 0.4 MG Cap     Take 1 capsule (0.4 mg) by mouth Daily after dinner     triamcinolone (KENALOG) 0.1 % ointment     Apply bid to eczema on lower body until clear, then use PRN          Flagged for Removal               apixaban (ELIQUIS) 5 MG     Take 1 tablet (5 mg) by mouth every 12 (twelve) hours             PCP: Pcp, None, MD    I reviewed patient's last ED visit, clinic visit or admission/discharge summary, as well as associated recent EKGs, lab or imaging results, if applicable.     Review of Systems     Please refer to HPI for pertinent positives and negatives.     Physical Exam   BP 104/63   Pulse 99   Temp 97.7 F (36.5 C) (Oral)   Resp 20   Wt 66.5 kg   SpO2 96%   BMI 21.63 kg/m     Physical Exam  Constitutional: Alert, well appearing and in no distress.   Head: Normocephalic and abrasion to forehead.   Mouth/Throat: Oropharynx is clear and moist.   Eyes: Conjunctivae normal. Pupils are equal and round.   Cardiovascular: Normal rate, regular rhythm  Pulmonary/Chest: Effort normal and breath sounds normal.   Abdominal: Non tender. Soft. Non distended. No guarding  Musculoskeletal: Left elbow with skin tear, no significant bony tenderness to palpation.  Compartments are soft.  Strong radial  pulse.  Neurological: Patient is alert and GCS score is 15.   Skin: Skin is warm and dry.  Psychiatric: Affect normal.      Medical Decision Making   I am the first provider for this patient.    I reviewed the vital signs, available nursing notes, allergies, past medical history, past surgical history, family history and social history. If pertinent, they are mentioned in HPI.     Personal Protective Equipment (PPE)  Gloves and surgical mask.    Provider Notes/Summary:     89 y.o. male with presentation to ED for evaluation of a fall with head strike, abrasion to forehead, left elbow skin tear.    Patient's differential includes but is not limited to the following high risk/life-threatening diagnoses: Intracranial bleed, fracture, cervical spine fracture, left elbow fracture, symptomatic anemia     I suspect that this is likely a mechanical fracture given patient found beside his wheelchair, likely with fall when trying to get up.  In review of patient's chart however, patient recently had an admission for GI bleed with blood transfusion.  Will obtain labs to evaluate for hemoglobin to assess for severe anemia or any metabolic derangements.  Patient is denying any acute complaints at this time.  Will obtain CT head, C-spine and left elbow x-ray to rule out intracranial bleed, injury and fracture.  Will provide local wound care.  If negative workup, anticipate likely discharge back to assisted living facility.       ED Course as of 05/31/23 2057   Fri May 31, 2023   1925 Hemoglobin(!): 8.6  Unchanged from prior [DT]   2013 CT Head WO Contrast  1.No acute intracranial abnormality. No acute fracture or hemorrhage.  2.Severe volume loss with severe chronic small vessel ischemic changes. Chronic right parietal infarct.   [DT]   2020 CT Cervical Spine without Contrast  1.No acute fracture or dislocation. [DT]   2021 Elbow Left AP Lateral And Obliques  Posterior soft tissue swelling. No acute fracture. [DT]   2027 No  evidence of acute traumatic injury.  Hemoglobin and electrolytes similar to baseline.  No evidence of GI bleed.  Blood pressure is 104/63.  Patient without any acute medical complaints at this time.  Patient caregiver reported patient is at his normal baseline.  No indication for inpatient admission at this time.  Will plan for discharge back to assisted living facility. [DT]   2049 Discussed with pt son and daughter tipper and murray via phone.  Discussed patient's presentation to ED and reassuring workup.  Family feels comfortable with [facility] to [facility]. [DT]      ED Course User Index  [DT] Richrd Humbles, MD       Problems Addressed:    Patient's differential includes but is not limited to the following high risk/life-threatening diagnoses: Intracranial bleed, fracture, cervical spine fracture, left elbow fracture, symptomatic anemia   Disposition: Discharge to  assisted living facility  Follow up: PMD as scheduled.     Amount and Complexity of Data Reviewed:    Pulse Oximetry Analysis:  Interpreted by me. 96% on RA - Normal  Cardiac Monitor: Interpreted by me. Rhythm:  Normal Sinus, Rate:  Normal, Ectopy:  None  Abnormal vitals signs addressed by me during patient's ED stay. Close monitoring.      * Labs: All labs have been ordered, reviewed and interpreted by me. See Provider Notes/Summary section for discussion.  * Xrays: Ordered, reviewed and interpreted by me, confirmed by radiology report. See Provider Notes/Summary section for discussion.   * CT/US/MRI, as applicable: Ordered and reviewed  by me, confirmed by radiology report. See Provider Notes/Summary section for discussion.    As a result of the findings as above, I chose the following management: See Provider Summary/Management section above for details.    Clinical information obtained from an independent historian. History obtained from or confirmed by EMS, who stated the information as outlined in the HPI.    Risk    Escalation of care,  including admission/observation considered due to presenting symptoms and history, but patient was able to be discharged due to nontoxic appearance, stable  hemodynamics, reassuring workup, joint decision-making.    Patient was treated with medications as described in Provider Summary/Management section and on reassessment was noted to be improved  __________________________________________________________________      The patient and/or family is/are aware that today's emergency department evaluation has limitations and is only a screening that can be falsely reassuring.  We discussed the need for follow up and strict return precautions. Patient and/or family demonstrate verbal understanding that they can return to the emergency department at any given time if they are having worsening symptoms, other complaints or difficulty with followup.      Diagnosis     Clinical Impression:   1. Fall, initial encounter    2. Abrasion of forehead, initial encounter    3. Abrasion of left elbow, initial encounter         Disposition:   ED Disposition       ED Disposition   Discharge    Condition   --    Date/Time   Fri May 31, 2023  8:28 PM    Comment   [facility] discharge to home/self care.    Condition at disposition: Stable  The above diagnostic process was due to medical necessity based on risk stratification of potential harm of patient's presenting complaint.     CHART OWNERSHIP: This note is prepared by Richrd Humbles, MD. I am the first provider for this patient.    This note was generated by the Epic EMR system/ Dragon speech recognition and may contain inherent errors or omissions not intended by the user. Grammatical errors, random word insertions, deletions and pronoun errors  are occasional consequences of this technology due to software limitations. Not all errors are caught or corrected. If there are questions or concerns about the content of this note or information contained within the  body of this dictation they should be addressed directly with the author for clarification.    Electronically signed by Richrd Humbles, MD.            [1]   Past Medical History:  Diagnosis Date    Closed fracture of left side of maxilla 04/03/2017    DVT (deep venous thrombosis)     Facial contusion, initial encounter 04/03/2017    Gastroesophageal reflux disease     Hyperlipemia     Left orbit fracture, closed, initial encounter 04/03/2017    PVD (peripheral vascular disease)    [2]   Past Surgical History:  Procedure Laterality Date    ABDOMINAL SURGERY      Hernia repair    EMBOLIZATION ARTERIAL GENERAL N/A 05/21/2023    Procedure: EMBOLIZATION ARTERIAL GENERAL;  Surgeon: Verlee Rossetti, MD;  Location: [facility];  Service: Interventional Radiology;  Laterality: N/A;   [3]   Family History  Problem Relation Name Age of Onset    Heart disease Father      Stroke Father      Diabetes Maternal Aunt     [4]   Social History  Tobacco Use    Smoking status: Never     Passive exposure: Never    Smokeless tobacco: Never   Vaping Use    Vaping status: Never Used   Substance Use Topics    Alcohol use: Yes     Comment: 3 per week    Drug use: No   [5]   Allergies  Allergen Reactions    Penicillins      Disorientation- per patient, no allergy as of 03/7023        Richrd Humbles, MD  05/31/23 2057
